# Patient Record
Sex: Male | Born: 1940 | Race: White | Hispanic: No | Marital: Married | Smoking: Former smoker
Health system: Southern US, Community
[De-identification: ages and names within clinical notes are randomized; demographics above are authoritative.]

## PROBLEM LIST (undated history)

## (undated) DIAGNOSIS — F329 Major depressive disorder, single episode, unspecified: Secondary | ICD-10-CM

## (undated) DIAGNOSIS — N1330 Unspecified hydronephrosis: Secondary | ICD-10-CM

## (undated) DIAGNOSIS — N200 Calculus of kidney: Secondary | ICD-10-CM

## (undated) DIAGNOSIS — R3915 Urgency of urination: Secondary | ICD-10-CM

## (undated) DIAGNOSIS — F1120 Opioid dependence, uncomplicated: Secondary | ICD-10-CM

## (undated) DIAGNOSIS — T8859XA Other complications of anesthesia, initial encounter: Secondary | ICD-10-CM

## (undated) DIAGNOSIS — N400 Enlarged prostate without lower urinary tract symptoms: Secondary | ICD-10-CM

## (undated) DIAGNOSIS — T4145XA Adverse effect of unspecified anesthetic, initial encounter: Secondary | ICD-10-CM

## (undated) DIAGNOSIS — Z87442 Personal history of urinary calculi: Secondary | ICD-10-CM

## (undated) DIAGNOSIS — Z8719 Personal history of other diseases of the digestive system: Secondary | ICD-10-CM

## (undated) DIAGNOSIS — H409 Unspecified glaucoma: Secondary | ICD-10-CM

## (undated) DIAGNOSIS — I10 Essential (primary) hypertension: Secondary | ICD-10-CM

## (undated) DIAGNOSIS — Z8711 Personal history of peptic ulcer disease: Secondary | ICD-10-CM

## (undated) DIAGNOSIS — F32A Depression, unspecified: Secondary | ICD-10-CM

## (undated) DIAGNOSIS — M1990 Unspecified osteoarthritis, unspecified site: Secondary | ICD-10-CM

## (undated) HISTORY — PX: CATARACT EXTRACTION W/ INTRAOCULAR LENS  IMPLANT, BILATERAL: SHX1307

## (undated) HISTORY — DX: Major depressive disorder, single episode, unspecified: F32.9

## (undated) HISTORY — DX: Depression, unspecified: F32.A

---

## 1968-06-12 HISTORY — PX: INGUINAL HERNIA REPAIR: SUR1180

## 1975-06-13 HISTORY — PX: OTHER SURGICAL HISTORY: SHX169

## 1984-06-12 HISTORY — PX: VAGOTOMY: SUR1431

## 2008-09-24 ENCOUNTER — Ambulatory Visit (HOSPITAL_COMMUNITY): Admission: RE | Admit: 2008-09-24 | Discharge: 2008-09-25 | Payer: Self-pay | Admitting: Neurosurgery

## 2008-09-24 HISTORY — PX: LUMBAR DISC SURGERY: SHX700

## 2010-01-11 ENCOUNTER — Encounter: Admission: RE | Admit: 2010-01-11 | Discharge: 2010-01-11 | Payer: Self-pay | Admitting: Family Medicine

## 2010-02-22 ENCOUNTER — Inpatient Hospital Stay (HOSPITAL_COMMUNITY)
Admission: RE | Admit: 2010-02-22 | Discharge: 2010-02-25 | Payer: Self-pay | Source: Home / Self Care | Admitting: Orthopedic Surgery

## 2010-02-22 HISTORY — PX: OTHER SURGICAL HISTORY: SHX169

## 2010-03-15 ENCOUNTER — Encounter
Admission: RE | Admit: 2010-03-15 | Discharge: 2010-06-09 | Payer: Self-pay | Source: Home / Self Care | Attending: Orthopedic Surgery | Admitting: Orthopedic Surgery

## 2010-08-25 LAB — CBC
HCT: 37.4 % — ABNORMAL LOW (ref 39.0–52.0)
HCT: 41.7 % (ref 39.0–52.0)
Hemoglobin: 14.5 g/dL (ref 13.0–17.0)
MCHC: 34.2 g/dL (ref 30.0–36.0)
MCHC: 34.8 g/dL (ref 30.0–36.0)
MCV: 89.1 fL (ref 78.0–100.0)
MCV: 89.3 fL (ref 78.0–100.0)
Platelets: 125 10*3/uL — ABNORMAL LOW (ref 150–400)
Platelets: 145 10*3/uL — ABNORMAL LOW (ref 150–400)
RBC: 3.84 MIL/uL — ABNORMAL LOW (ref 4.22–5.81)
RDW: 13.1 % (ref 11.5–15.5)
RDW: 13.2 % (ref 11.5–15.5)
RDW: 13.3 % (ref 11.5–15.5)
WBC: 5.8 10*3/uL (ref 4.0–10.5)
WBC: 6.7 10*3/uL (ref 4.0–10.5)

## 2010-08-25 LAB — BASIC METABOLIC PANEL
Calcium: 9.3 mg/dL (ref 8.4–10.5)
Chloride: 106 mEq/L (ref 96–112)
Creatinine, Ser: 0.89 mg/dL (ref 0.4–1.5)
Creatinine, Ser: 0.89 mg/dL (ref 0.4–1.5)
GFR calc Af Amer: >60 mL/min (ref >60)
Glucose, Bld: 106 mg/dL — ABNORMAL HIGH (ref 70–99)
Glucose, Bld: 125 mg/dL — ABNORMAL HIGH (ref 70–99)
Potassium: 3.8 mEq/L (ref 3.5–5.1)
Sodium: 137 mEq/L (ref 135–145)

## 2010-08-25 LAB — APTT: aPTT: 29 seconds (ref 24–37)

## 2010-08-25 LAB — ABO/RH: ABO/RH(D): O POS

## 2010-08-25 LAB — TYPE AND SCREEN
ABO/RH(D): O POS
Antibody Screen: NEGATIVE

## 2010-08-25 LAB — PROTIME-INR
INR: 1.96 — ABNORMAL HIGH (ref 0.00–1.49)
INR: 1 (ref 0.00–1.49)
Prothrombin Time: 23.1 seconds — ABNORMAL HIGH (ref 11.6–15.2)
Prothrombin Time: 13.4 seconds (ref 11.6–15.2)
Prothrombin Time: 14.1 seconds (ref 11.6–15.2)

## 2010-08-25 LAB — SURGICAL PCR SCREEN: MRSA, PCR: NEGATIVE

## 2010-09-21 LAB — CBC
Hemoglobin: 13.2 g/dL (ref 13.0–17.0)
MCV: 96.3 fL (ref 78.0–100.0)
Platelets: 175 10*3/uL (ref 150–400)

## 2010-09-21 LAB — URINALYSIS, ROUTINE W REFLEX MICROSCOPIC
Nitrite: NEGATIVE
Protein, ur: NEGATIVE mg/dL
Specific Gravity, Urine: 1.022 (ref 1.005–1.030)
pH: 5.5 (ref 5.0–8.0)

## 2010-09-21 LAB — BASIC METABOLIC PANEL
BUN: 21 mg/dL (ref 6–23)
Creatinine, Ser: 0.75 mg/dL (ref 0.4–1.5)
Sodium: 138 mEq/L (ref 135–145)

## 2010-09-21 LAB — APTT: aPTT: 30 seconds (ref 24–37)

## 2010-09-21 LAB — PROTIME-INR
INR: 1 (ref 0.00–1.49)
Prothrombin Time: 13.7 seconds (ref 11.6–15.2)

## 2010-10-25 NOTE — Op Note (Signed)
NAMECICERO, NOY               ACCOUNT NO.:  [id number]   MEDICAL RECORD NO.:  [id number]          PATIENT TYPE:  OIB   LOCATION:  3599                         FACILITY:  [facility]   PHYSICIAN:  Clydene Fake, M.D.  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   PROCEDURE:  Decompressive laminectomy, decompression of the L3 and L4  roots (two levels), diskectomy, microdissection with microscope.   SURGEON:  Clydene Fake, MD   ASSISTANT:  Danae Orleans. Venetia Maxon, MD   ANESTHESIA:  General endotracheal tube anesthesia.   ESTIMATED BLOOD LOSS:  Minimal.   BLOOD GIVEN:  None.   DRAINS:  None.   COMPLICATIONS:  None.   REASON FOR PROCEDURE:  The patient is a 70 year old gentleman with back,  leg pain, trouble walking, claudicatory symptoms, severe pain.  MRI was  done showing multiple spinal change with huge disk herniation at L3-4  causing canal stenosis along with the spinal change.  The patient  brought in for decompression.   PROCEDURE IN DETAIL:  The patient was brought to the operating room and  general anesthesia was induced.  The patient was placed in a prone  position on Wilson frame with all pressure points padded.  The patient  was prepped and draped in sterile fashion.  Site of incision was  injected with 10 mL of 1% lidocaine with epinephrine.  Needles were put  in the interspace through the skin. An x-ray was obtained showing we  were pointed at the L3-4 interbody space.  Incision was then made in the  midline lumbar spine centered where the needle was incision taken down  to the fascia.  Hemostasis obtained with Bovie cauterization.  The  fascia was incised in the right side and  subperiosteal dissection was  done over the L3 and L4 spinous processes and lamina out to the facets.  Marker was placed in the interspace.  Another x-ray was obtained  confirming our positioning at L3-4 interspace.  High-speed drill was  used to start semi-hemi-laminectomy, medial facetectomy.  Microscope was  brought in for microdissection.  This was completed again with high-  speed drill and Kerrison punches removing most of the lamina of L3 and  some lamina of L4.  Did a foraminotomy over the four roots and medial  facetectomy.  We moved to the ligamentum flavum and the dura was tented  up, it looked like definitely under some anterior compression.  We  carefully dissected through the epidural space using microdissection  technique still removing lateral ligament and then found free fragments  of disk.  We started evacuating these amount of small pieces.  We used  various nerve hooks to sweep under the dura and pull the large  fragments.  We were able to get a D'Errico nerve retractor and retract  the dura and we continued removing the fragments  of disk herniation and  now some of the remaining __________ were within the ligament and  annulus was carefully dissected through that and pulled out more  fragments.  Still we had a fairly boggy disk.  More fragments were  coming out and so we had incised the disk space due to diskectomy with  pituitary rongeurs and curettes.  We still reached over with long hooks  towards the contralateral side and pulled out even more fragments.  There were fragments behind the body of L4 and behind the body of L3.  We carefully brought them down into the field and removed them.  We got  hemostasis with bipolar cauterization and Gelfoam and thrombin.  Gelfoam  was irrigated out.  When we were finished, we had good decompression of  the central canal and the L3 and L4 rootsand they went out their  foramina well.  We had good hemostasis.  Retractors removed.   Fascia  closed with 0  Vicryl interrupted sutures.  Subcutaneous tissue closed  with 2-0 and 3-0 Vicryl interrupted suture.  Skin was closed with  benzoin, Steri-Strips, dressing was placed.  The patient was placed back  in supine position, awoken from anesthesia, and transferred to recovery  room in stable condition.            ______________________________  Clydene Fake, M.D.     JRH/MEDQ  D:  09/24/2008  T:  09/25/2008  Job:  [id number]

## 2011-01-17 ENCOUNTER — Other Ambulatory Visit: Payer: Self-pay | Admitting: Family Medicine

## 2011-01-17 ENCOUNTER — Other Ambulatory Visit: Payer: Self-pay

## 2011-01-19 ENCOUNTER — Ambulatory Visit
Admission: RE | Admit: 2011-01-19 | Discharge: 2011-01-19 | Disposition: A | Discharge status: Home / Self Care | Payer: BC Managed Care – HMO | Source: Ambulatory Visit | Attending: Family Medicine | Admitting: Family Medicine

## 2011-03-06 ENCOUNTER — Ambulatory Visit (HOSPITAL_COMMUNITY)
Admission: RE | Admit: 2011-03-06 | Discharge: 2011-03-06 | Disposition: A | Discharge status: Home / Self Care | Payer: BC Managed Care – PPO | Source: Ambulatory Visit | Attending: Urology | Admitting: Urology

## 2011-03-06 DIAGNOSIS — N200 Calculus of kidney: Secondary | ICD-10-CM | POA: Insufficient documentation

## 2011-03-06 DIAGNOSIS — I10 Essential (primary) hypertension: Secondary | ICD-10-CM | POA: Insufficient documentation

## 2011-03-06 HISTORY — PX: EXTRACORPOREAL SHOCK WAVE LITHOTRIPSY: SHX1557

## 2011-11-19 IMAGING — CR DG CHEST 2V
2 series · 2 of 2 positions shown · non-contrast
Comparison: 09/23/2008

CLINICAL DATA: Preadmission evaluation

CHEST - 2 VIEW

[view not recorded (1 of 2)]
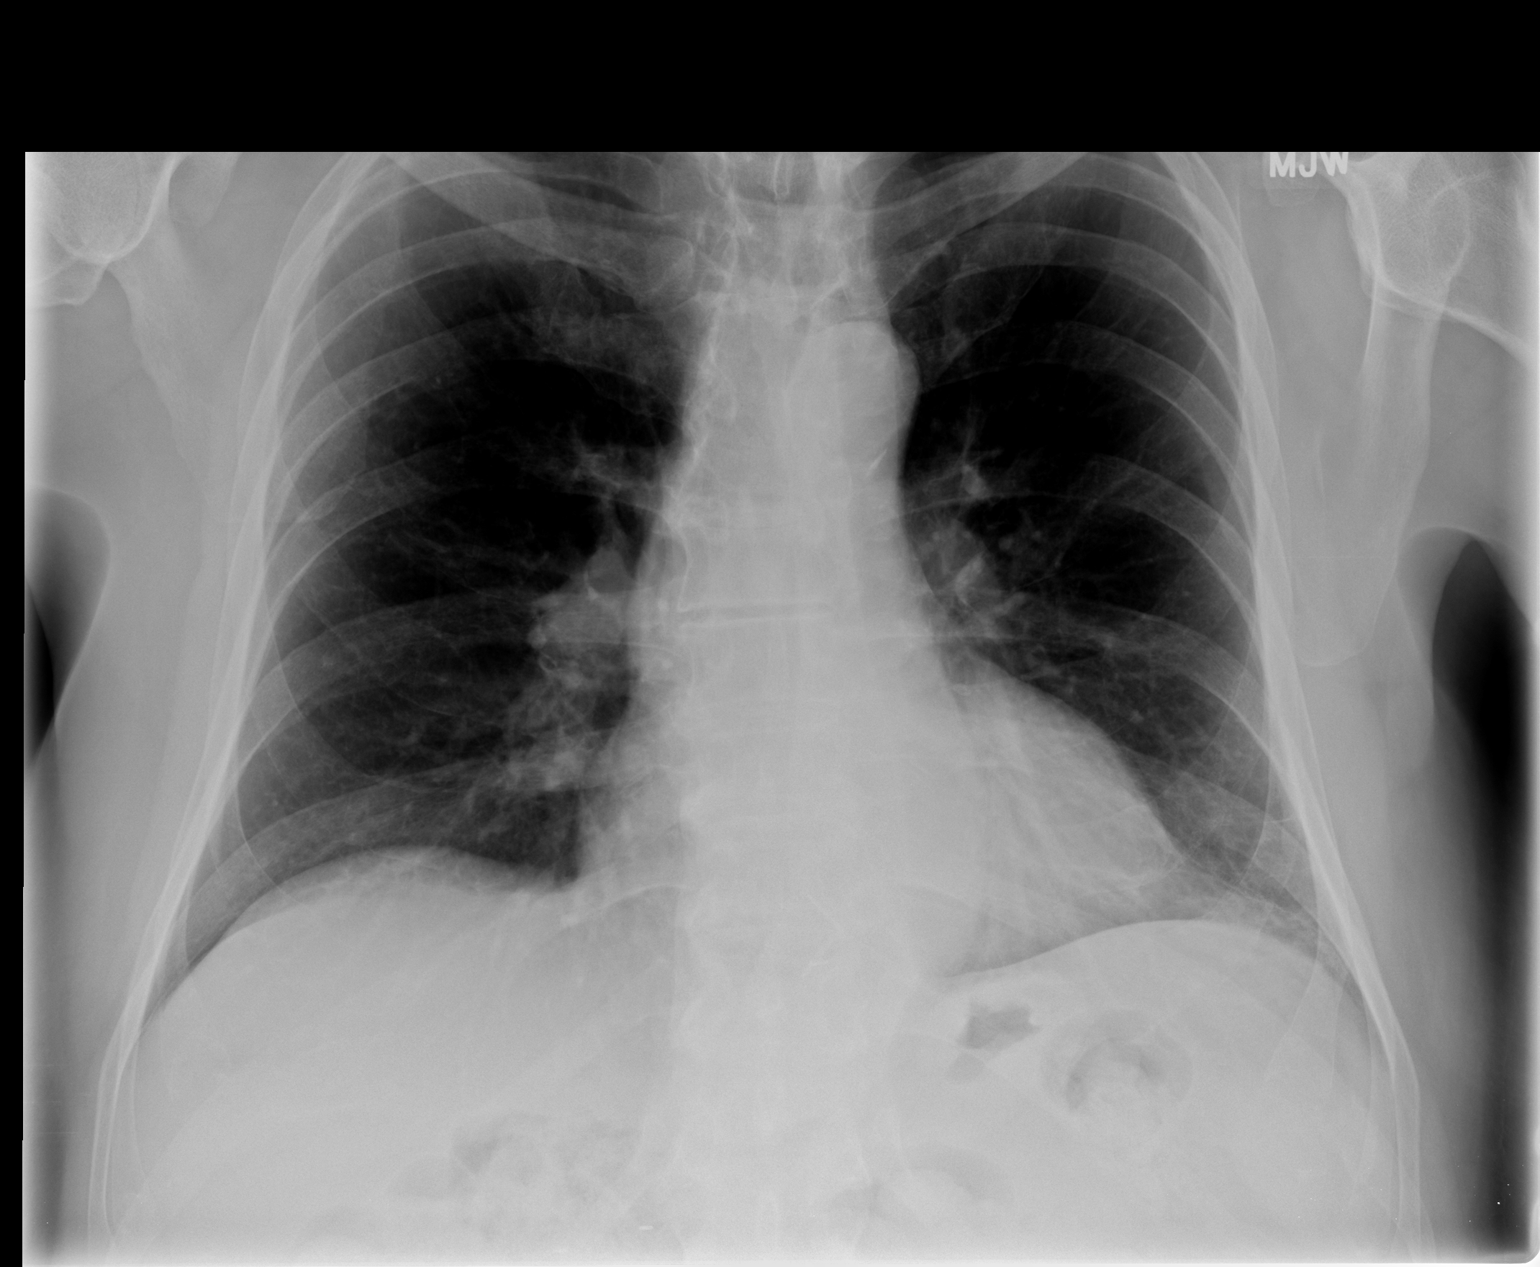

[view not recorded (2 of 2)]
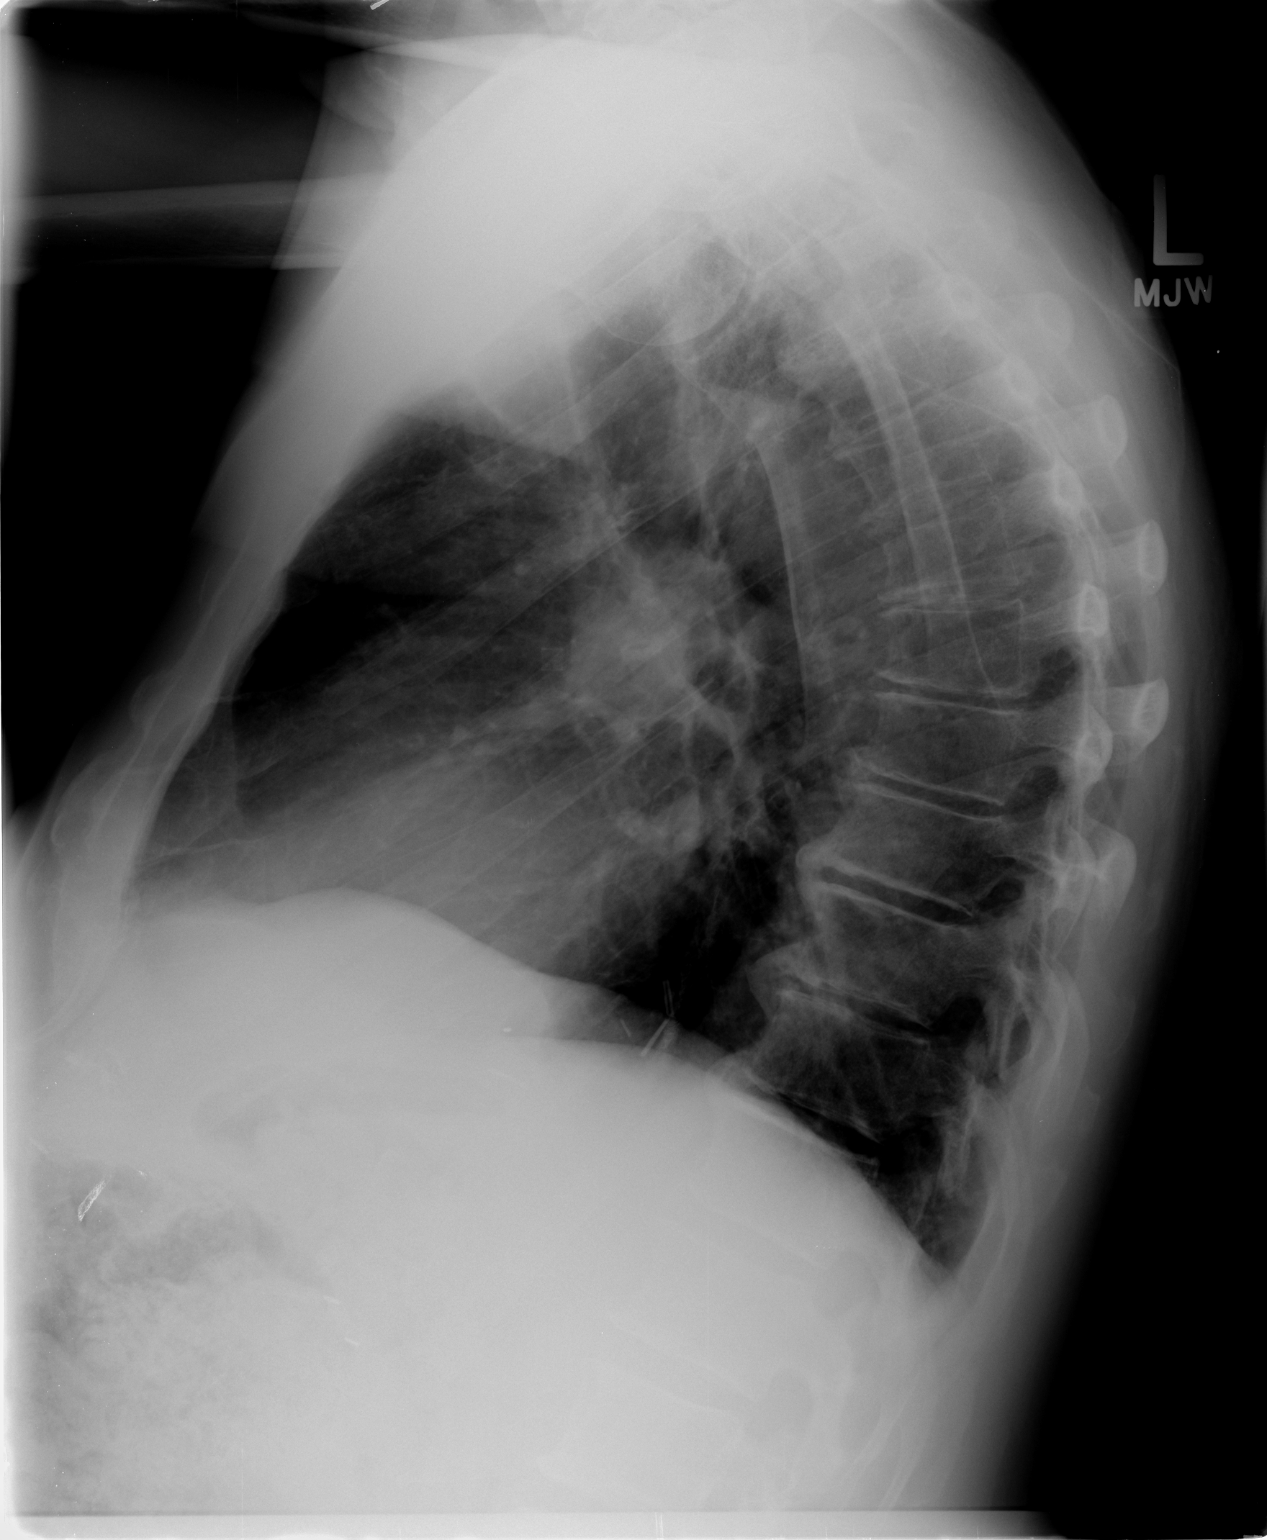

[2 of 2 positions shown; findings below may reference images not displayed]

FINDINGS: The lungs are clear bilaterally with only minimal left
base atelectasis seen.  No confluent airspace opacities, pleural
effuions or pneumothoracies are seen.  The  heart is normal in size
in contour.  The upper abdomen and osseous structures are normal.
Degenerative changes noted in the mid thoracic spine.
IMPRESSION: No acute cardiopulmonary disease.

## 2012-10-15 ENCOUNTER — Emergency Department (HOSPITAL_COMMUNITY)
Admission: EM | Admit: 2012-10-15 | Discharge: 2012-10-15 | Disposition: A | Discharge status: Home / Self Care | Payer: Medicare Other | Attending: Emergency Medicine | Admitting: Emergency Medicine

## 2012-10-15 ENCOUNTER — Emergency Department (HOSPITAL_COMMUNITY): Payer: Medicare Other

## 2012-10-15 ENCOUNTER — Encounter (HOSPITAL_COMMUNITY): Payer: Self-pay

## 2012-10-15 DIAGNOSIS — R109 Unspecified abdominal pain: Secondary | ICD-10-CM | POA: Insufficient documentation

## 2012-10-15 DIAGNOSIS — R142 Eructation: Secondary | ICD-10-CM | POA: Insufficient documentation

## 2012-10-15 DIAGNOSIS — R141 Gas pain: Secondary | ICD-10-CM | POA: Insufficient documentation

## 2012-10-15 DIAGNOSIS — N400 Enlarged prostate without lower urinary tract symptoms: Secondary | ICD-10-CM | POA: Insufficient documentation

## 2012-10-15 DIAGNOSIS — R319 Hematuria, unspecified: Secondary | ICD-10-CM

## 2012-10-15 DIAGNOSIS — Z79899 Other long term (current) drug therapy: Secondary | ICD-10-CM | POA: Insufficient documentation

## 2012-10-15 DIAGNOSIS — Z9849 Cataract extraction status, unspecified eye: Secondary | ICD-10-CM | POA: Insufficient documentation

## 2012-10-15 DIAGNOSIS — I10 Essential (primary) hypertension: Secondary | ICD-10-CM | POA: Insufficient documentation

## 2012-10-15 DIAGNOSIS — Z872 Personal history of diseases of the skin and subcutaneous tissue: Secondary | ICD-10-CM | POA: Insufficient documentation

## 2012-10-15 DIAGNOSIS — F1120 Opioid dependence, uncomplicated: Secondary | ICD-10-CM | POA: Insufficient documentation

## 2012-10-15 DIAGNOSIS — Z87891 Personal history of nicotine dependence: Secondary | ICD-10-CM | POA: Insufficient documentation

## 2012-10-15 DIAGNOSIS — Z7982 Long term (current) use of aspirin: Secondary | ICD-10-CM | POA: Insufficient documentation

## 2012-10-15 HISTORY — DX: Opioid dependence, uncomplicated: F11.20

## 2012-10-15 HISTORY — DX: Essential (primary) hypertension: I10

## 2012-10-15 LAB — CBC WITH DIFFERENTIAL/PLATELET
Basophils Absolute: 0 10*3/uL (ref 0.0–0.1)
Eosinophils Relative: 1 % (ref 0–5)
HCT: 44.3 % (ref 39.0–52.0)
Hemoglobin: 15.1 g/dL (ref 13.0–17.0)
Lymphocytes Relative: 12 % (ref 12–46)
Lymphs Abs: 1.2 10*3/uL (ref 0.7–4.0)
MCV: 86.2 fL (ref 78.0–100.0)
Monocytes Absolute: 0.7 10*3/uL (ref 0.1–1.0)
Monocytes Relative: 6 % (ref 3–12)
Neutro Abs: 8.4 10*3/uL — ABNORMAL HIGH (ref 1.7–7.7)
RBC: 5.14 MIL/uL (ref 4.22–5.81)
RDW: 14.8 % (ref 11.5–15.5)
WBC: 10.4 10*3/uL (ref 4.0–10.5)

## 2012-10-15 LAB — COMPREHENSIVE METABOLIC PANEL
AST: 35 U/L (ref 0–37)
CO2: 23 mEq/L (ref 19–32)
Chloride: 100 mEq/L (ref 96–112)
Creatinine, Ser: 0.88 mg/dL (ref 0.50–1.35)
GFR calc Af Amer: >90 mL/min (ref >90)
GFR calc non Af Amer: 83 mL/min — ABNORMAL LOW (ref >90)
Glucose, Bld: 129 mg/dL — ABNORMAL HIGH (ref 70–99)
Total Bilirubin: 0.8 mg/dL (ref 0.3–1.2)

## 2012-10-15 LAB — URINALYSIS, ROUTINE W REFLEX MICROSCOPIC
Bilirubin Urine: NEGATIVE
Nitrite: NEGATIVE
Protein, ur: 100 mg/dL — AB
Urobilinogen, UA: 0.2 mg/dL (ref 0.0–1.0)

## 2012-10-15 LAB — LIPASE, BLOOD: Lipase: 23 U/L (ref 11–59)

## 2012-10-15 MED ORDER — IOHEXOL 300 MG/ML  SOLN
100.0000 mL | Freq: Once | INTRAMUSCULAR | Status: AC | PRN
  Administered 2012-10-15: 100 mL via INTRAVENOUS

## 2012-10-15 MED ORDER — ACETAMINOPHEN 325 MG PO TABS
650.0000 mg | ORAL_TABLET | Freq: Once | ORAL | Status: AC
  Administered 2012-10-15: 650 mg via ORAL
  Filled 2012-10-15: qty 2

## 2012-10-15 MED ORDER — CEPHALEXIN 500 MG PO CAPS: 500.0000 mg | ORAL_CAPSULE | Freq: Four times a day (QID) | ORAL | Status: DC

## 2012-10-15 MED ORDER — SODIUM CHLORIDE 0.9 % IV SOLN
Freq: Once | INTRAVENOUS | Status: AC
  Administered 2012-10-15: 01:00:00 via INTRAVENOUS

## 2012-10-15 MED ORDER — IOHEXOL 300 MG/ML  SOLN
50.0000 mL | Freq: Once | INTRAMUSCULAR | Status: AC | PRN
  Administered 2012-10-15: 50 mL via ORAL

## 2012-10-15 MED ORDER — ONDANSETRON HCL 4 MG/2ML IJ SOLN
4.0000 mg | Freq: Once | INTRAMUSCULAR | Status: AC
  Administered 2012-10-15: 4 mg via INTRAVENOUS
  Filled 2012-10-15: qty 2

## 2012-10-15 NOTE — ED Provider Notes (Signed)
History     CSN: [id number]  Arrival date & time 10/15/12  0011   First MD Initiated Contact with Patient 10/15/12 0138      Chief Complaint  Patient presents with  . Abdominal Pain    (Consider location/radiation/quality/duration/timing/severity/associated sxs/prior treatment) Patient is a 72 y.o. male presenting with abdominal pain. The history is provided by the patient and the spouse.  Abdominal Pain  patient here complaining of abdominal pain characterized as sharp and located in the left side was started this morning. No fever or vomiting. Denies any dysuria or hematuria. Pain has been constant and has been associated with increased flatus. No treatment used for this prior to arrival. Nothing makes her symptoms better or worse. No prior history of same. Patient denies any skin rashes. Pain does not radiate.  Past Medical History  Diagnosis Date  . Hypertension   . Enlarged prostate   . Opiate addiction   . Ulcer     Past Surgical History  Procedure Laterality Date  . Hernia repair      bilateral  . Back surgery    . Abdominal surgery    . Lithotripsy    . Medial partial knee replacement    . Cataract extraction      No family history on file.  History  Substance Use Topics  . Smoking status: Former Games developer  . Smokeless tobacco: Not on file  . Alcohol Use: 0.6 oz/week    1 Cans of beer per week     Comment: 1 beer every night      Review of Systems  Gastrointestinal: Positive for abdominal pain.  All other systems reviewed and are negative.    Allergies  Review of patient's allergies indicates no known allergies.  Home Medications   Current Outpatient Rx  Name  Route  Sig  Dispense  Refill  . aluminum hydroxide-magnesium carbonate (GAVISCON) 95-358 MG/15ML SUSP   Oral   Take 30 mLs by mouth as needed. Abdominal pain           BP 181/89  Pulse 58  Temp(Src) 97.6 F (36.4 C) (Oral)  Resp 18  Ht 5\' 9"  (1.753 m)  Wt 205 lb (92.987 kg)  BMI  30.26 kg/m2  SpO2 100%  Physical Exam  Nursing note and vitals reviewed. Constitutional: He is oriented to person, place, and time. He appears well-developed and well-nourished.  Non-toxic appearance. No distress.  HENT:  Head: Normocephalic and atraumatic.  Eyes: Conjunctivae, EOM and lids are normal. Pupils are equal, round, and reactive to light.  Neck: Normal range of motion. Neck supple. No tracheal deviation present. No mass present.  Cardiovascular: Normal rate, regular rhythm and normal heart sounds.  Exam reveals no gallop.   No murmur heard. Pulmonary/Chest: Effort normal and breath sounds normal. No stridor. No respiratory distress. He has no decreased breath sounds. He has no wheezes. He has no rhonchi. He has no rales.  Abdominal: Soft. Normal appearance and bowel sounds are normal. He exhibits no distension. There is tenderness in the left lower quadrant. There is guarding. There is no rigidity, no rebound and no CVA tenderness.    Musculoskeletal: Normal range of motion. He exhibits no edema and no tenderness.  Neurological: He is alert and oriented to person, place, and time. He has normal strength. No cranial nerve deficit or sensory deficit. GCS eye subscore is 4. GCS verbal subscore is 5. GCS motor subscore is 6.  Skin: Skin is warm and dry. No abrasion  and no rash noted.  Psychiatric: He has a normal mood and affect. His speech is normal and behavior is normal.    ED Course  Procedures (including critical care time)  Labs Reviewed  CBC WITH DIFFERENTIAL - Abnormal; Notable for the following:    Platelets 148 (*)    Neutrophils Relative 80 (*)    Neutro Abs 8.4 (*)    All other components within normal limits  COMPREHENSIVE METABOLIC PANEL - Abnormal; Notable for the following:    Sodium 134 (*)    Glucose, Bld 129 (*)    GFR calc non Af Amer 83 (*)    All other components within normal limits  URINALYSIS, ROUTINE W REFLEX MICROSCOPIC  LIPASE, BLOOD   No  results found.   No diagnosis found.    MDM  Pt informed about his ct results--will f/u his urologist        Toy Baker, MD 10/15/12 [id number]

## 2012-10-15 NOTE — ED Notes (Signed)
Pt states he has been having cramping pain in his left side since 2000 tonight.  States feeling nauseated.  Pt states he has not eaten since breakfast.  Pt is pale. Pt states he took Gaviscon at 2100 with no relief of pain.

## 2012-10-23 ENCOUNTER — Other Ambulatory Visit: Payer: Self-pay | Admitting: Urology

## 2012-10-30 ENCOUNTER — Encounter (HOSPITAL_BASED_OUTPATIENT_CLINIC_OR_DEPARTMENT_OTHER): Payer: Self-pay | Admitting: *Deleted

## 2012-10-30 NOTE — Progress Notes (Signed)
NPO AFTER MN. ARRIVES AT 0700. NEEDS ISTAT 8 AND EKG. WILL TAKE NORVASC AM OF SURG W/ SIP OF WATER.

## 2012-11-05 NOTE — Anesthesia Preprocedure Evaluation (Addendum)
Anesthesia Evaluation  Patient identified by MRN, date of birth, ID band Patient awake    Reviewed: Allergy & Precautions, H&P , NPO status , Patient's Chart, lab work & pertinent test results  Airway Mallampati: II TM Distance: >3 FB Neck ROM: full    Dental  (+) Edentulous Upper and Edentulous Lower   Pulmonary COPDformer smoker,  80 py smoking history breath sounds clear to auscultation  Pulmonary exam normal       Cardiovascular Exercise Tolerance: Good hypertension, Pt. on medications Rhythm:regular Rate:Normal     Neuro/Psych negative neurological ROS  negative psych ROS   GI/Hepatic negative GI ROS, Neg liver ROS, (+)     substance abuse   , Opiate addiction. On suboxone   Endo/Other  negative endocrine ROS  Renal/GU negative Renal ROS  negative genitourinary   Musculoskeletal   Abdominal   Peds  Hematology negative hematology ROS (+)   Anesthesia Other Findings   Reproductive/Obstetrics negative OB ROS                         Anesthesia Physical Anesthesia Plan  ASA: III  Anesthesia Plan: General   Post-op Pain Management:    Induction: Intravenous  Airway Management Planned: LMA  Additional Equipment:   Intra-op Plan:   Post-operative Plan:   Informed Consent: I have reviewed the patients History and Physical, chart, labs and discussed the procedure including the risks, benefits and alternatives for the proposed anesthesia with the patient or authorized representative who has indicated his/her understanding and acceptance.   Dental Advisory Given  Plan Discussed with: CRNA and Surgeon  Anesthesia Plan Comments:         Anesthesia Quick Evaluation

## 2012-11-06 ENCOUNTER — Encounter (HOSPITAL_BASED_OUTPATIENT_CLINIC_OR_DEPARTMENT_OTHER): Payer: Self-pay

## 2012-11-06 ENCOUNTER — Encounter (HOSPITAL_BASED_OUTPATIENT_CLINIC_OR_DEPARTMENT_OTHER)
Admission: RE | Disposition: A | Discharge status: Home / Self Care | Payer: Self-pay | Source: Ambulatory Visit | Attending: Urology

## 2012-11-06 ENCOUNTER — Ambulatory Visit (HOSPITAL_BASED_OUTPATIENT_CLINIC_OR_DEPARTMENT_OTHER): Payer: Medicare Other | Admitting: Anesthesiology

## 2012-11-06 ENCOUNTER — Ambulatory Visit (HOSPITAL_BASED_OUTPATIENT_CLINIC_OR_DEPARTMENT_OTHER)
Admission: RE | Admit: 2012-11-06 | Discharge: 2012-11-06 | Disposition: A | Discharge status: Home / Self Care | Payer: Medicare Other | Source: Ambulatory Visit | Attending: Urology | Admitting: Urology

## 2012-11-06 ENCOUNTER — Encounter (HOSPITAL_BASED_OUTPATIENT_CLINIC_OR_DEPARTMENT_OTHER): Payer: Self-pay | Admitting: Anesthesiology

## 2012-11-06 DIAGNOSIS — E669 Obesity, unspecified: Secondary | ICD-10-CM | POA: Insufficient documentation

## 2012-11-06 DIAGNOSIS — J4489 Other specified chronic obstructive pulmonary disease: Secondary | ICD-10-CM | POA: Insufficient documentation

## 2012-11-06 DIAGNOSIS — M1710 Unilateral primary osteoarthritis, unspecified knee: Secondary | ICD-10-CM | POA: Insufficient documentation

## 2012-11-06 DIAGNOSIS — H409 Unspecified glaucoma: Secondary | ICD-10-CM | POA: Insufficient documentation

## 2012-11-06 DIAGNOSIS — Z6830 Body mass index (BMI) 30.0-30.9, adult: Secondary | ICD-10-CM | POA: Insufficient documentation

## 2012-11-06 DIAGNOSIS — J449 Chronic obstructive pulmonary disease, unspecified: Secondary | ICD-10-CM | POA: Insufficient documentation

## 2012-11-06 DIAGNOSIS — N138 Other obstructive and reflux uropathy: Secondary | ICD-10-CM | POA: Insufficient documentation

## 2012-11-06 DIAGNOSIS — Z79899 Other long term (current) drug therapy: Secondary | ICD-10-CM | POA: Insufficient documentation

## 2012-11-06 DIAGNOSIS — K219 Gastro-esophageal reflux disease without esophagitis: Secondary | ICD-10-CM | POA: Insufficient documentation

## 2012-11-06 DIAGNOSIS — N200 Calculus of kidney: Secondary | ICD-10-CM | POA: Insufficient documentation

## 2012-11-06 DIAGNOSIS — Z87891 Personal history of nicotine dependence: Secondary | ICD-10-CM | POA: Insufficient documentation

## 2012-11-06 DIAGNOSIS — N1330 Unspecified hydronephrosis: Secondary | ICD-10-CM | POA: Insufficient documentation

## 2012-11-06 DIAGNOSIS — R3915 Urgency of urination: Secondary | ICD-10-CM | POA: Insufficient documentation

## 2012-11-06 DIAGNOSIS — R350 Frequency of micturition: Secondary | ICD-10-CM | POA: Insufficient documentation

## 2012-11-06 DIAGNOSIS — N401 Enlarged prostate with lower urinary tract symptoms: Secondary | ICD-10-CM | POA: Insufficient documentation

## 2012-11-06 DIAGNOSIS — I10 Essential (primary) hypertension: Secondary | ICD-10-CM | POA: Insufficient documentation

## 2012-11-06 DIAGNOSIS — F1120 Opioid dependence, uncomplicated: Secondary | ICD-10-CM | POA: Insufficient documentation

## 2012-11-06 HISTORY — DX: Personal history of urinary calculi: Z87.442

## 2012-11-06 HISTORY — DX: Unspecified glaucoma: H40.9

## 2012-11-06 HISTORY — PX: CYSTOSCOPY WITH BIOPSY: SHX5122

## 2012-11-06 HISTORY — DX: Urgency of urination: R39.15

## 2012-11-06 HISTORY — DX: Adverse effect of unspecified anesthetic, initial encounter: T41.45XA

## 2012-11-06 HISTORY — DX: Unspecified osteoarthritis, unspecified site: M19.90

## 2012-11-06 HISTORY — DX: Personal history of other diseases of the digestive system: Z87.19

## 2012-11-06 HISTORY — DX: Personal history of peptic ulcer disease: Z87.11

## 2012-11-06 HISTORY — DX: Calculus of kidney: N20.0

## 2012-11-06 HISTORY — PX: HOLMIUM LASER APPLICATION: SHX5852

## 2012-11-06 HISTORY — DX: Benign prostatic hyperplasia without lower urinary tract symptoms: N40.0

## 2012-11-06 HISTORY — DX: Unspecified hydronephrosis: N13.30

## 2012-11-06 HISTORY — DX: Other complications of anesthesia, initial encounter: T88.59XA

## 2012-11-06 LAB — POCT I-STAT, CHEM 8
BUN: 20 mg/dL (ref 6–23)
Chloride: 105 mEq/L (ref 96–112)
Potassium: 4.1 mEq/L (ref 3.5–5.1)
Sodium: 141 mEq/L (ref 135–145)
TCO2: 28 mmol/L (ref 0–100)

## 2012-11-06 SURGERY — CYSTOURETEROSCOPY, WITH STENT INSERTION
Anesthesia: General | Site: Ureter | Laterality: Left | Wound class: Clean Contaminated

## 2012-11-06 MED ORDER — LACTATED RINGERS IV SOLN
INTRAVENOUS | Status: DC
  Administered 2012-11-06 (×2): via INTRAVENOUS
  Filled 2012-11-06: qty 1000

## 2012-11-06 MED ORDER — FENTANYL CITRATE 0.05 MG/ML IJ SOLN
INTRAMUSCULAR | Status: DC | PRN
  Administered 2012-11-06: 25 ug via INTRAVENOUS
  Administered 2012-11-06: 50 ug via INTRAVENOUS
  Administered 2012-11-06: 25 ug via INTRAVENOUS

## 2012-11-06 MED ORDER — OXYCODONE-ACETAMINOPHEN 5-325 MG PO TABS: 1.0000 | ORAL_TABLET | ORAL | Status: DC | PRN

## 2012-11-06 MED ORDER — OXYBUTYNIN CHLORIDE 5 MG PO TABS: 5.0000 mg | ORAL_TABLET | Freq: Three times a day (TID) | ORAL | Status: DC | PRN

## 2012-11-06 MED ORDER — GENTAMICIN IN SALINE 1.6-0.9 MG/ML-% IV SOLN
80.0000 mg | INTRAVENOUS | Status: AC
  Administered 2012-11-06: 470 mg via INTRAVENOUS
  Filled 2012-11-06: qty 50

## 2012-11-06 MED ORDER — EPHEDRINE SULFATE 50 MG/ML IJ SOLN
INTRAMUSCULAR | Status: DC | PRN
  Administered 2012-11-06 (×2): 10 mg via INTRAVENOUS
  Administered 2012-11-06: 5 mg via INTRAVENOUS
  Administered 2012-11-06: 10 mg via INTRAVENOUS

## 2012-11-06 MED ORDER — SENNA-DOCUSATE SODIUM 8.6-50 MG PO TABS: 1.0000 | ORAL_TABLET | Freq: Two times a day (BID) | ORAL | Status: DC

## 2012-11-06 MED ORDER — ACETAMINOPHEN 10 MG/ML IV SOLN
INTRAVENOUS | Status: DC | PRN
  Administered 2012-11-06: 1000 mg via INTRAVENOUS

## 2012-11-06 MED ORDER — ONDANSETRON HCL 4 MG/2ML IJ SOLN
INTRAMUSCULAR | Status: DC | PRN
  Administered 2012-11-06: 4 mg via INTRAVENOUS

## 2012-11-06 MED ORDER — PROPOFOL 10 MG/ML IV BOLUS
INTRAVENOUS | Status: DC | PRN
  Administered 2012-11-06: 200 mg via INTRAVENOUS

## 2012-11-06 MED ORDER — SODIUM CHLORIDE 0.9 % IR SOLN
Status: DC | PRN
  Administered 2012-11-06: 6000 mL

## 2012-11-06 MED ORDER — FENTANYL CITRATE 0.05 MG/ML IJ SOLN
25.0000 ug | INTRAMUSCULAR | Status: DC | PRN
  Filled 2012-11-06: qty 1

## 2012-11-06 MED ORDER — METOCLOPRAMIDE HCL 5 MG/ML IJ SOLN
INTRAMUSCULAR | Status: DC | PRN
  Administered 2012-11-06: 10 mg via INTRAVENOUS

## 2012-11-06 MED ORDER — SULFAMETHOXAZOLE-TRIMETHOPRIM 400-80 MG PO TABS: 1.0000 | ORAL_TABLET | Freq: Two times a day (BID) | ORAL | Status: DC

## 2012-11-06 MED ORDER — IOHEXOL 350 MG/ML SOLN
INTRAVENOUS | Status: DC | PRN
  Administered 2012-11-06: 24 mL

## 2012-11-06 MED ORDER — LIDOCAINE HCL (CARDIAC) 20 MG/ML IV SOLN
INTRAVENOUS | Status: DC | PRN
  Administered 2012-11-06: 80 mg via INTRAVENOUS

## 2012-11-06 MED ORDER — KETOROLAC TROMETHAMINE 30 MG/ML IJ SOLN
INTRAMUSCULAR | Status: DC | PRN
  Administered 2012-11-06: 15 mg via INTRAVENOUS

## 2012-11-06 MED ORDER — DEXAMETHASONE SODIUM PHOSPHATE 4 MG/ML IJ SOLN
INTRAMUSCULAR | Status: DC | PRN
  Administered 2012-11-06: 10 mg via INTRAVENOUS

## 2012-11-06 MED ORDER — LACTATED RINGERS IV SOLN
INTRAVENOUS | Status: DC
  Filled 2012-11-06: qty 1000

## 2012-11-06 SURGICAL SUPPLY — 46 items
ADAPTER CATH URET PLST 4-6FR (CATHETERS) IMPLANT
BAG DRAIN URO-CYSTO SKYTR STRL (DRAIN) ×3 IMPLANT
BAG URO CATCHER STRL LF (DRAPE) ×3 IMPLANT
BASKET LASER NITINOL 1.9FR (BASKET) ×3 IMPLANT
BASKET STNLS GEMINI 4WIRE 3FR (BASKET) IMPLANT
BASKET ZERO TIP NITINOL 2.4FR (BASKET) IMPLANT
BRUSH URET BIOPSY 3F (UROLOGICAL SUPPLIES) ×3 IMPLANT
CANISTER SUCT LVC 12 LTR MEDI- (MISCELLANEOUS) ×3 IMPLANT
CATH FOLEY 2WAY  3CC  8FR (CATHETERS)
CATH FOLEY 2WAY 3CC 8FR (CATHETERS) IMPLANT
CATH INTERMIT  6FR 70CM (CATHETERS) IMPLANT
CATH URET 5FR 28IN CONE TIP (BALLOONS)
CATH URET 5FR 28IN OPEN ENDED (CATHETERS) IMPLANT
CATH URET 5FR 70CM CONE TIP (BALLOONS) IMPLANT
CLOTH BEACON ORANGE TIMEOUT ST (SAFETY) ×3 IMPLANT
DRAPE CAMERA CLOSED 9X96 (DRAPES) ×3 IMPLANT
DRESSING TELFA 8X3 (GAUZE/BANDAGES/DRESSINGS) ×3 IMPLANT
ELECT REM PT RETURN 9FT ADLT (ELECTROSURGICAL)
ELECTRODE REM PT RTRN 9FT ADLT (ELECTROSURGICAL) IMPLANT
FIBER LASER TRAC TIP (UROLOGICAL SUPPLIES) IMPLANT
FORCEPS BIOP 2.4F 115CM BACKLD (INSTRUMENTS) ×3 IMPLANT
GLOVE BIO SURGEON STRL SZ 6 (GLOVE) ×3 IMPLANT
GLOVE BIO SURGEON STRL SZ 6.5 (GLOVE) ×3 IMPLANT
GLOVE BIO SURGEON STRL SZ7 (GLOVE) ×3 IMPLANT
GLOVE BIO SURGEON STRL SZ7.5 (GLOVE) ×3 IMPLANT
GOWN PREVENTION PLUS LG XLONG (DISPOSABLE) IMPLANT
GOWN PREVENTION PLUS XLARGE (GOWN DISPOSABLE) IMPLANT
GOWN STRL NON-REIN LRG LVL3 (GOWN DISPOSABLE) IMPLANT
GOWN STRL REIN XL XLG (GOWN DISPOSABLE) IMPLANT
GUIDEWIRE 0.038 PTFE COATED (WIRE) IMPLANT
GUIDEWIRE ANG ZIPWIRE 038X150 (WIRE) ×3 IMPLANT
GUIDEWIRE STR DUAL SENSOR (WIRE) ×3 IMPLANT
IV NS IRRIG 3000ML ARTHROMATIC (IV SOLUTION) ×6 IMPLANT
KIT BALLIN UROMAX 15FX10 (LABEL) IMPLANT
KIT BALLN UROMAX 15FX4 (MISCELLANEOUS) IMPLANT
KIT BALLN UROMAX 26 75X4 (MISCELLANEOUS)
LASER FIBER DISP (UROLOGICAL SUPPLIES) ×3 IMPLANT
PACK CYSTOSCOPY (CUSTOM PROCEDURE TRAY) ×3 IMPLANT
SET HIGH PRES BAL DIL (LABEL)
SHEATH ACCESS URETERAL 38CM (SHEATH) ×3 IMPLANT
SHEATH URET ACCESS 12FR/35CM (UROLOGICAL SUPPLIES) IMPLANT
SHEATH URET ACCESS 12FR/55CM (UROLOGICAL SUPPLIES) IMPLANT
STENT CONTOUR 7FRX26 (STENTS) ×3 IMPLANT
SYRINGE 10CC LL (SYRINGE) ×3 IMPLANT
SYRINGE IRR TOOMEY STRL 70CC (SYRINGE) IMPLANT
TUBE FEEDING 8FR 16IN STR KANG (MISCELLANEOUS) ×3 IMPLANT

## 2012-11-06 NOTE — H&P (Signed)
David Carroll is an 72 y.o. male.    Chief Complaint: Pre-Op Left Ureteroscopic Stone Manipulation, Transurethral Resection of Prostate  HPI:   1 - Left Nephrolithiasis / Hydro / Flank Pain - Pt with approx 1.5cm left sided scatterd stones with moderate hydro and UPJ thickeneing by CT in ER 10/2012, no obvious obstructing stones to explain hydro. UA w/o infectious parameters.This was performed on w/u for several hour episode of acute lefat abdominal pain that has since completely resolved.  2 -  Lower Urinary Tract Symptoms - Pt on Doxazosin 8mg  nihgtly for obstructive symptoms. CT 10/2012 with massive trilobar hypertrophy with large intravesicale median lobe, total volume about 100gm. Minimal bother on current regimen.  PMH sig for bilateral inguinal hernia repair, back surgery (no deficits), ex-lap for perforated ulcer. No CV disease / blood thinners / diabetes.   Today Tayron is seen to proceed with left ureteroscopy / laser lithotripsy and possible channel TURP to address above. His most recent UCX was negative.  Past Medical History  Diagnosis Date  . Hypertension   . Opiate addiction   . Hydronephrosis, left   . Nephrolithiasis   . BPH (benign prostatic hypertrophy)   . History of gastric ulcer   . History of kidney stones   . Urgency of urination   . Glaucoma   . History of gastroesophageal reflux (GERD)   . Arthritis     KNEES  . Complication of anesthesia SLOW RECOVERING FROM ANES. DRUGS    HX OPIATE ADDICTION-- PT ON SUBOXONE    Past Surgical History  Procedure Laterality Date  . Lumbar disc surgery  09-24-2008    L3 -- L4  . Medial compartment arthroplasty, left knee  02-22-2010  . Extracorporeal shock wave lithotripsy Left 03-06-2011  . Cataract extraction w/ intraocular lens  implant, bilateral    . Vagotomy  1986    ULCERS  . Removal benign mass left lung  1977  . Inguinal hernia repair Bilateral 1970    RE-DO LEFT 1985    History reviewed. No pertinent  family history. Social History:  reports that he quit smoking about 6 years ago. His smoking use included Cigarettes. He has a 80 pack-year smoking history. He has never used smokeless tobacco. He reports that he drinks about 4.2 ounces of alcohol per week. He reports that he does not use illicit drugs.  Allergies: No Known Allergies  No prescriptions prior to admission    No results found for this or any previous visit (from the past 48 hour(s)). No results found.  Review of Systems  Constitutional: Negative.  Negative for fever and chills.  HENT: Negative.   Eyes: Negative.   Respiratory: Negative.   Cardiovascular: Negative.   Gastrointestinal: Negative.  Negative for nausea and vomiting.  Genitourinary: Positive for frequency.  Musculoskeletal: Negative.   Skin: Negative.   Neurological: Negative.   Endo/Heme/Allergies: Negative.   Psychiatric/Behavioral: Negative.     Height 5\' 9"  (1.753 m), weight 92.987 kg (205 lb). Physical Exam  Constitutional: He is oriented to person, place, and time. He appears well-developed and well-nourished.  HENT:  Head: Normocephalic and atraumatic.  Eyes: EOM are normal. Pupils are equal, round, and reactive to light.  Neck: Normal range of motion.  Respiratory: Effort normal.  GI: Soft. Bowel sounds are normal.  Moderate obesity  Genitourinary: Penis normal.  No CVAT  Musculoskeletal: Normal range of motion.  Neurological: He is alert and oriented to person, place, and time.  Skin: Skin is warm  and dry.  Psychiatric: He has a normal mood and affect. His behavior is normal. Judgment and thought content normal.     Assessment/Plan  1 - Left Nephrolithiasis / Hydro / Flank Pain - Unusual picure with sig hydro and UPJ thickening not directly explained by obstructing stone. Discussed role of endoscopic exam with ureteroscopy with goal of ruling out mass / tumor and treating his intra-renal stones. May need to be staged given large stone  burden. May also need channel TURP at same time as huge medial lobe may not allow access to ureteral orifice.  We re-discussed ureteroscopic stone manipulation with basketing and laser-lithotripsy in detail.  We re-discussed risks including bleeding, infection, damage to kidney / ureter  bladder, rarely loss of kidney. We re-discussed anesthetic risks and rare but serious surgical complications including DVT, PE, MI, and mortality. We specifically addressed that in 5-10% of cases a staged approach is required with stenting followed by re-attempt ureteroscopy if anatomy unfavorable. The patient voiced understanding and wises to proceed.   2 - Lower Urinary Tract Symptoms - Discussed role of channel TURP / resection of median lobe at time of URS as per above to hep with his LUTS as well as allow for ureteroscopy. No h/o retention.  We re-discussed options for medical refractory prostatic outlet obstruction including TURP, TUNA, TUMT, Green-Light, Ho-LEP, and simple prostatectomy with their respective risks, benefits, and long-term outcomes data. Pt has opted for TURP. We re-discussed the typical peri-operative course with overnight admission and discharge with foley in place with subsequent office voiding trial few days later. We re-discussed risks including bleeding, infection, incontinence, need for repeat procedures / tissue regrowth over time as well as rare risks including DVT, PE, MI, CVA and mortality.    Malini Flemings 11/06/2012, 6:16 AM

## 2012-11-06 NOTE — Brief Op Note (Signed)
11/06/2012  10:32 AM  PATIENT:  Geronimo Boot  72 y.o. male  PRE-OPERATIVE DIAGNOSIS:  LEFT HYDRONEPHROSIS,AND NEPHROLITHIASIS  POST-OPERATIVE DIAGNOSIS:  LEFT HYDRONEPHROSIS,AND NEPHROLITHIASIS  PROCEDURE:  Procedure(s): CYSTOSCOPY WITH LEFT RETROGRADE, LEFT  URETEROSCOPY AND  LEFT STENT PLACEMENT (Left) HOLMIUM LASER APPLICATION (Left) CYSTOSCOPY WITH BIOPSY (Left)  SURGEON:  Surgeon(s) and Role:    * Sebastian Ache, MD - Primary  PHYSICIAN ASSISTANT:   ASSISTANTS: none   ANESTHESIA:   general  EBL:  Total I/O In: 1000 [I.V.:1000] Out: -   BLOOD ADMINISTERED:none  DRAINS: none   LOCAL MEDICATIONS USED:  NONE  SPECIMEN:  Source of Specimen:  1 - Left Renal Stones, 2 - Left UPJ Brush Biopsy  DISPOSITION OF SPECIMEN:  1 - Stones for compositional Analysis, 2 - Left UPJ Brush Biopsy to cytology pathology  COUNTS:  YES  TOURNIQUET:  * No tourniquets in log *  DICTATION: .Other Dictation: Dictation Number [id number]  PLAN OF CARE: Discharge to home after PACU  PATIENT DISPOSITION:  PACU - hemodynamically stable.   Delay start of Pharmacological VTE agent (>24hrs) due to surgical blood loss or risk of bleeding: not applicable

## 2012-11-06 NOTE — Anesthesia Postprocedure Evaluation (Signed)
  Anesthesia Post-op Note  Patient: David Carroll  Procedure(s) Performed: Procedure(s) (LRB): CYSTOSCOPY WITH LEFT RETROGRADE, LEFT  URETEROSCOPY AND  LEFT STENT PLACEMENT (Left) HOLMIUM LASER APPLICATION (Left) CYSTOSCOPY WITH BIOPSY (Left)  Patient Location: PACU  Anesthesia Type: General  Level of Consciousness: awake and alert   Airway and Oxygen Therapy: Patient Spontanous Breathing  Post-op Pain: mild  Post-op Assessment: Post-op Vital signs reviewed, Patient's Cardiovascular Status Stable, Respiratory Function Stable, Patent Airway and No signs of Nausea or vomiting  Last Vitals:  Filed Vitals:   11/06/12 1130  BP: 137/71  Pulse: 75  Temp:   Resp: 10    Post-op Vital Signs: stable   Complications: No apparent anesthesia complications

## 2012-11-06 NOTE — Anesthesia Procedure Notes (Signed)
Procedure Name: LMA Insertion Date/Time: 11/06/2012 8:30 AM Performed by: Norva Pavlov Pre-anesthesia Checklist: Patient identified, Emergency Drugs available, Suction available and Patient being monitored Patient Re-evaluated:Patient Re-evaluated prior to inductionOxygen Delivery Method: Circle System Utilized Preoxygenation: Pre-oxygenation with 100% oxygen Intubation Type: IV induction Ventilation: Mask ventilation without difficulty LMA: LMA inserted LMA Size: 5.0 Number of attempts: 1 Airway Equipment and Method: bite block Placement Confirmation: positive ETCO2 Tube secured with: Tape Dental Injury: Teeth and Oropharynx as per pre-operative assessment

## 2012-11-06 NOTE — Transfer of Care (Signed)
Immediate Anesthesia Transfer of Care Note  Patient: David Carroll  Procedure(s) Performed: Procedure(s) (LRB): CYSTOSCOPY WITH LEFT RETROGRADE, LEFT  URETEROSCOPY AND  LEFT STENT PLACEMENT (Left) HOLMIUM LASER APPLICATION (Left) CYSTOSCOPY WITH BIOPSY (Left)  Patient Location: PACU  Anesthesia Type: General  Level of Consciousness: drowsy  Airway & Oxygen Therapy: Patient Spontanous Breathing and Patient connected to face mask oxygen  Post-op Assessment: Report given to PACU RN and Post -op Vital signs reviewed and stable  Post vital signs: Reviewed and stable  Complications: No apparent anesthesia complications

## 2012-11-07 ENCOUNTER — Encounter (HOSPITAL_BASED_OUTPATIENT_CLINIC_OR_DEPARTMENT_OTHER): Payer: Self-pay | Admitting: Urology

## 2012-11-07 NOTE — Op Note (Signed)
David Carroll, David Carroll               ACCOUNT NO.:  [id number]  MEDICAL RECORD NO.:  [id number]  LOCATION:                                FACILITY:  [facility]  PHYSICIAN:  Sebastian Ache, MD     DATE OF BIRTH:  1940-12-24  DATE OF PROCEDURE:  11/06/2012 DATE OF DISCHARGE:                              OPERATIVE REPORT   DIAGNOSES:  Large volume left renal stone, left hydronephrosis.  PROCEDURES: 1. Cystoscopy with left retrograde pyelogram, interpretation. 2. Left ureteroscopic stone manipulation with laser lithotripsy. 3. Brush biopsy of left ureter. 4. Placement of left ureteral stent, 7 x 26, no tether.  SPECIMEN: 1. Left UPJ brush biopsy for permanent pathology. 2. Left renal stone for compositional analysis.  FINDINGS: 1. 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  No     obvious filling defects or intraluminal lesions.  The UPJ was first     biopsied. 2. Large volume small left intrarenal stones, 63 individual stones     were removed. 3. Otherwise unremarkable left retrograde pyelogram. 4. Trilobar prostatic hypertrophy.  ESTIMATED BLOOD LOSS:  Nil.  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  He also has baseline obstructive prostatic symptoms and known trilobar prostatic hypertrophy.  Options were discussed for management including endoscopic exam to help rule out malignancy and treated stones and he wished to proceed with the latter. Informed consent was obtained and placed in the medical record.  PROCEDURE IN DETAIL:  The patient being David Carroll, was verified. Procedure being left ureteroscopic stone manipulation and ureteral biopsy was confirmed.  Procedure was carried out.  Time-out was performed.  Intravenous antibiotics were administered.   General LMA anesthesia was introduced.  The patient was placed into a low lithotomy position.  Sterile field was created by prepping and draping the patient's penis, perineum, and proximal thighs using iodine x3.  Next, cystourethroscopy was performed using a 22-French rigid cystoscope with 12-degree offset lens.  Inspection of the anterior and posterior urethra revealed large trilobar prostatic hypertrophy with a very prominent median lobe.  Despite this, it did allow complete visualization of the urinary bladder and area of the ureteral orifices.  No papular lesions, calcifications were noted.  Next, the left ureteral orifice was gently cannulated using a 6-French end-hole catheter and left retrograde pyelogram was seen.  Left retrograde pyelogram demonstrated a single left ureter, single- system left kidney.  There was hydronephrosis with ureteral nephrosis consistent with narrowing at the area of the UPJ, this appeared to be mild.  There were multiple intrarenal filling defect consistent with known stone.  A 0.038 Glidewire was advanced at the level of the upper pole and set aside as a safety wire.  Next, semi-rigid ureteroscopy was performed at the distal 2/3rd of the ureter and alongside a separate Sensor working wire using normal saline irrigation under pressure.  No mucosal abnormalities or calcifications were encountered.  Next, the semi-rigid ureteroscope was exchanged for the 38-cm 12/14 ureteral access  sheath, which was placed over the Sensory working wire using continuous fluoroscopic vision to the level of the UPJ.  Next, the 8- French flexible digital ureteroscope was used to perform inspection of the proximal ureter and intrarenal kidney.  This did reveal relative narrowing at the area of the UPJ without obvious mass consistent with a mild UPJ obstruction.  There were numerous small stones within two separate lower pole calices.  The vast majority of which appear to  be amenable to simple basketing as such and escape-type basket was used to remove greater than 40 stones individually setting aside for compositional analysis.  The remainder of the stone appeared to be too large for simple basketing as such holmium laser energy was applied using settings of 0.5 joules and 10 hertz, and a 290-mm fiber and these were broken into fragments, 3 mm or less in diameter, was then allowed basketing of the remainder of the stones, 63 individual stones were removed.  Repeat panendoscopy revealed excellent hemostasis.  No evidence of renal perforation and complete removal of all stones greater than 1/3-mm.  Attention was then directed to the area of the left UPJ, again as there was relative narrowing here and some thickening noted on previous CAT scan.  It was felt that the biopsy was warranted.  Initial attempt was made to biopsy this using the BIGopsy.  However, as we were attempting to biopsying the area to the side of the ureter, this would not allow for adequate specimen.  Decision was made to proceed with a brush biopsy and the brush biopsy apparatus was loaded through the ureteroscope and under direct vision, brush biopsy was taken to the layer of the UPJ.  There appeared to be sufficient cellularity and set aside for permanent pathology.  Repeat exam revealed no evidence of perforation and complete removal of all stones and no mucosal abnormalities on reinspection of entire length of the ureter.  Finally, a new 7 x 26 stent was placed over the remaining Glidewire using cystoscopic and fluoroscopic guidance.  Good proximal and distal curl were noted.  Bladder was emptied per cystoscope.  Procedure was then terminated.  The patient tolerated the procedure well.  There were no immediate periprocedural complications.  The patient was taken to postanesthesia care unit in stable condition.          ______________________________ Sebastian Ache,  MD     TM/MEDQ  D:  11/06/2012  T:  11/07/2012  Job:  [id number]

## 2013-09-18 ENCOUNTER — Encounter (HOSPITAL_COMMUNITY): Payer: Self-pay | Admitting: Psychiatry

## 2013-09-18 ENCOUNTER — Ambulatory Visit (INDEPENDENT_AMBULATORY_CARE_PROVIDER_SITE_OTHER): Payer: 59 | Admitting: Psychiatry

## 2013-09-18 ENCOUNTER — Encounter (INDEPENDENT_AMBULATORY_CARE_PROVIDER_SITE_OTHER): Payer: Self-pay

## 2013-09-18 VITALS — BP 126/82 | HR 76 | Ht 68.5 in | Wt 213.2 lb

## 2013-09-18 DIAGNOSIS — F1120 Opioid dependence, uncomplicated: Secondary | ICD-10-CM | POA: Insufficient documentation

## 2013-09-18 DIAGNOSIS — F329 Major depressive disorder, single episode, unspecified: Secondary | ICD-10-CM | POA: Insufficient documentation

## 2013-09-18 MED ORDER — BUPROPION HCL ER (XL) 150 MG PO TB24: 300.0000 mg | ORAL_TABLET | Freq: Every morning | ORAL | Status: DC
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
## 2013-09-18 NOTE — Patient Instructions (Signed)
Fax over the labs [phone]  Medications: Increase Wellbutrin XR 300mg  po qAM, continue Sertraline 100mg  po qAM. Taper off the Xanax 0.5mg  po qPM prn for 5 days then stop.

## 2013-10-14 ENCOUNTER — Ambulatory Visit (INDEPENDENT_AMBULATORY_CARE_PROVIDER_SITE_OTHER): Payer: 59 | Admitting: Psychiatry

## 2013-10-14 DIAGNOSIS — F1920 Other psychoactive substance dependence, uncomplicated: Secondary | ICD-10-CM

## 2013-10-14 DIAGNOSIS — F1010 Alcohol abuse, uncomplicated: Secondary | ICD-10-CM

## 2013-10-23 ENCOUNTER — Ambulatory Visit (INDEPENDENT_AMBULATORY_CARE_PROVIDER_SITE_OTHER): Payer: 59 | Admitting: Psychiatry

## 2013-10-23 DIAGNOSIS — F1920 Other psychoactive substance dependence, uncomplicated: Secondary | ICD-10-CM

## 2013-10-23 DIAGNOSIS — F1010 Alcohol abuse, uncomplicated: Secondary | ICD-10-CM

## 2013-11-11 ENCOUNTER — Ambulatory Visit (INDEPENDENT_AMBULATORY_CARE_PROVIDER_SITE_OTHER): Payer: 59 | Admitting: Psychiatry

## 2013-11-11 DIAGNOSIS — F1920 Other psychoactive substance dependence, uncomplicated: Secondary | ICD-10-CM

## 2013-11-11 DIAGNOSIS — F1010 Alcohol abuse, uncomplicated: Secondary | ICD-10-CM

## 2013-11-18 ENCOUNTER — Encounter (HOSPITAL_COMMUNITY): Payer: Self-pay | Admitting: Psychiatry

## 2013-11-18 ENCOUNTER — Ambulatory Visit (INDEPENDENT_AMBULATORY_CARE_PROVIDER_SITE_OTHER): Payer: 59 | Admitting: Psychiatry

## 2013-11-18 VITALS — BP 140/86 | HR 78 | Ht 67.5 in | Wt 209.0 lb

## 2013-11-18 DIAGNOSIS — F1120 Opioid dependence, uncomplicated: Secondary | ICD-10-CM

## 2013-11-18 DIAGNOSIS — F329 Major depressive disorder, single episode, unspecified: Secondary | ICD-10-CM

## 2013-11-18 NOTE — Progress Notes (Signed)
[facility] [id number] Progress Note  David Carroll [id number] 73 y.o.  11/18/2013 10:17 AM  Chief Complaint: medication change  History of Present Illness: Pt states he is doing ok. Pt has been on Brintellix for the last 15 days as prescribed by his PCP at the pt's request. Pt states he is tolerating it and denies SE. PCP also started him on Vyvanse 30mg  yesterday for fatigue. Pt is able to say yet what effect the Vyvanse is having.  He stopped Wellbutrin and Zoloft after 1.5 months without any change in mood symptoms. Pt states he could not remember when his appt was with this provider so he had his PCP change his meds.   Today reports depression is on going without change. Sleep and energy are poor. He is getting about 7 hrs of broken sleep. Appetite is fair. Denies anhedonia, worthlessness and hopelessness. States the biggest problem with his depression is his fatigue and he feels Vyvanse is the missing piece.   Was told his B12 is high and he stopped the shots 2 months ago. Pt will send over new labs results.   Has now switched to once/month appt with therapist as opposed to once a week.  Denies substance abuse at this time.   Suicidal Ideation: No Plan Formed: No Patient has means to carry out plan: No  Homicidal Ideation: No Plan Formed: No Patient has means to carry out plan: No  Review of Systems: Psychiatric: Agitation: No Hallucination: No Depressed Mood: Yes Insomnia: Yes Hypersomnia: Yes Altered Concentration: Yes Feels Worthless: No Grandiose Ideas: No Belief In Special Powers: No New/Increased Substance Abuse: No Compulsions: No  Neurologic: Headache: No Seizure: No Paresthesias: No  Past Medical History: Hypertension  .  Opiate addiction  .  Hydronephrosis, left  .  Nephrolithiasis  .  BPH (benign prostatic hypertrophy)  .  History of gastric ulcer  .  History of kidney stones  .  Urgency of urination  .  Glaucoma  .  History of  gastroesophageal reflux (GERD)  .  Arthritis  KNEES  .  Complication of anesthesia  SLOW RECOVERING FROM ANES. DRUGS  HX OPIATE ADDICTION-- PT ON SUBOXONE  .  Depression   Past Family and Social Hx: Reports family hx of depression. Pt quit smoking in 2008. He has a hx of opiate addiction and in April 2015 he stopped taking Suboxone. He drinks 1 can of beer most nights of the week. 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   Outpatient Encounter Prescriptions as of 11/18/2013  Medication Sig  . acidophilus (RISAQUAD) CAPS Take 1 capsule by mouth daily.  Marland Kitchen amLODipine (NORVASC) 5 MG tablet Take 5 mg by mouth every morning.   Marland Kitchen aspirin EC 81 MG tablet Take 81 mg by mouth every evening.   . brimonidine (ALPHAGAN P) 0.1 % SOLN Apply 1 drop to eye 2 (two) times daily.  Marland Kitchen buPROPion (WELLBUTRIN XL) 150 MG 24 hr tablet Take 2 tablets (300 mg total) by mouth every morning.  . cholecalciferol (VITAMIN D) 1000 UNITS tablet Take 2,000 Units by mouth daily.   Marland Kitchen doxazosin (CARDURA XL) 8 MG 24 hr tablet Take 8 mg by mouth at bedtime.   Marland Kitchen losartan (COZAAR) 50 MG tablet Take 50 mg by mouth daily.  . Multiple Vitamin (MULTIVITAMIN WITH MINERALS) TABS Take 1 tablet by mouth daily.  Marland Kitchen oxybutynin (DITROPAN) 5 MG tablet Take 1 tablet (5 mg total) by mouth every  8 (eight) hours as needed. For bladder spasms / stent colic  . sennosides-docusate sodium (SENOKOT-S) 8.6-50 MG tablet Take 1 tablet by mouth 2 (two) times daily. While taking pain meds to prevent constipation.  . sertraline (ZOLOFT) 100 MG tablet Take 100 mg by mouth at bedtime.     Past Psychiatric History/Hospitalization(s): Anxiety: No Bipolar Disorder: No Depression: Yes Mania: No Psychosis: No Schizophrenia: No Personality Disorder: No Hospitalization for psychiatric illness: No History of Electroconvulsive Shock Therapy: No Prior Suicide Attempts: No  Physical Exam: Constitutional:  BP 140/86  Pulse 78   Ht 5' 7.5" (1.715 m)  Wt 209 lb (94.802 kg)  BMI 32.23 kg/m2  Musculoskeletal: Strength & Muscle Tone: within normal limits Gait & Station: normal Patient leans: N/A  Mental Status Examination/Evaluation:  Objective: Appearance: Neat and Well Groomed   Eye Contact:: Good   Speech: Clear and Coherent and Normal Rate   Volume: Normal   Mood: depressed   Affect: Appropriate and Congruent   Thought Process: Goal Directed, Linear and Logical   Orientation: Full (Time, Place, and Person)   Thought Content: WDL   Suicidal Thoughts: No   Homicidal Thoughts: No   Judgement: Fair   Insight: Fair   Psychomotor Activity: Normal   Akathisia: No   Handed: Right   AIMS (if indicated): n/a   Assets: Communication Skills  Desire for Improvement  Journalist, newspaperinancial Resources/Insurance  Housing  Resilience  Social Support  Transportation      Medical Decision Making (Choose Three): Established Problem, Stable/Improving (1), Review of Psycho-Social Stressors (1), Review of Medication Regimen & Side Effects (2) and Review of New Medication or Change in Dosage (2)  Assessment: Axis I: MDD; Opiate Dependence  Axis II: deferred  Axis III: Hypertension, Opiate addiction on Suboxone, Hydronephrosis, left, Nephrolithiasis, BPH (benign prostatic hypertrophy), History of gastric ulcer, History of kidney stones, Urgency of urination, Glaucoma, History of gastroesophageal reflux (GERD), Arthritis KNEES, Complication of anesthesia, SLOW RECOVERING FROM ANES.   Axis IV: health stressors  Axis V: 51   Plan:  Plan of Care: Continue meds, risks/benefits and SE of the medication discussed. Pt verbalized understanding and verbal consent obtained for treatment.  Affirm with the patient that the medications are taken as ordered. Patient expressed understanding of how their medications were to be used.   Pt will sign relase for discussing with his new pcp. Advised pt that this provider should be the only one  managing his psychiatric medications. Will discuss with his new PCP with pt's permission.   Laboratory: had labs one week ago and will have results faxed over to us   Psychotherapy: Therapy: brief supportive therapy provided. Discussed psychosocial stressors in detail.    Medications: d/c Wellbutrin XR and Zoloft as pt stopped both meds 2 weeks ago. Pt is currently prescribed Brintillex 10mg  po qD for mood and Vyvanse 30mg  qD for fatigue by his PCP. Advised pt that until provider had discussion with PCP, the PCP would have to continue to prescribe meds.   Routine PRN Medications: No   Consultations: encouraged pt to f/up with therapist as scheduled.  Safety Concerns:Pt denies SI and is at an acute low risk for suicide.Patient told to call clinic if any problems occur. Patient advised to go to ER  if she should develop SI/HI, side effects, or if symptoms worsen. Has crisis numbers to call if needed. Pt verbalized understanding.  Other: 2 months f/up   Oletta DarterAGARWAL, Tamanna Whitson, MD 11/18/2013

## 2014-01-20 ENCOUNTER — Ambulatory Visit (HOSPITAL_COMMUNITY): Payer: Self-pay | Admitting: Psychiatry

## 2014-07-13 ENCOUNTER — Emergency Department (HOSPITAL_COMMUNITY): Payer: Medicare Other

## 2014-07-13 ENCOUNTER — Emergency Department (HOSPITAL_COMMUNITY)
Admission: EM | Admit: 2014-07-13 | Discharge: 2014-07-13 | Disposition: A | Discharge status: Home / Self Care | Payer: Medicare Other | Attending: Emergency Medicine | Admitting: Emergency Medicine

## 2014-07-13 ENCOUNTER — Encounter (HOSPITAL_COMMUNITY): Payer: Self-pay | Admitting: Emergency Medicine

## 2014-07-13 DIAGNOSIS — S43014A Anterior dislocation of right humerus, initial encounter: Secondary | ICD-10-CM | POA: Diagnosis not present

## 2014-07-13 DIAGNOSIS — Y92310 Basketball court as the place of occurrence of the external cause: Secondary | ICD-10-CM | POA: Diagnosis not present

## 2014-07-13 DIAGNOSIS — Z87891 Personal history of nicotine dependence: Secondary | ICD-10-CM | POA: Diagnosis not present

## 2014-07-13 DIAGNOSIS — M1990 Unspecified osteoarthritis, unspecified site: Secondary | ICD-10-CM | POA: Diagnosis not present

## 2014-07-13 DIAGNOSIS — Y998 Other external cause status: Secondary | ICD-10-CM | POA: Insufficient documentation

## 2014-07-13 DIAGNOSIS — I10 Essential (primary) hypertension: Secondary | ICD-10-CM | POA: Diagnosis not present

## 2014-07-13 DIAGNOSIS — W1839XA Other fall on same level, initial encounter: Secondary | ICD-10-CM | POA: Diagnosis not present

## 2014-07-13 DIAGNOSIS — Z87442 Personal history of urinary calculi: Secondary | ICD-10-CM | POA: Diagnosis not present

## 2014-07-13 DIAGNOSIS — Z8669 Personal history of other diseases of the nervous system and sense organs: Secondary | ICD-10-CM | POA: Insufficient documentation

## 2014-07-13 DIAGNOSIS — Z8659 Personal history of other mental and behavioral disorders: Secondary | ICD-10-CM | POA: Insufficient documentation

## 2014-07-13 DIAGNOSIS — S4991XA Unspecified injury of right shoulder and upper arm, initial encounter: Secondary | ICD-10-CM | POA: Diagnosis present

## 2014-07-13 DIAGNOSIS — Z87438 Personal history of other diseases of male genital organs: Secondary | ICD-10-CM | POA: Insufficient documentation

## 2014-07-13 DIAGNOSIS — Z8719 Personal history of other diseases of the digestive system: Secondary | ICD-10-CM | POA: Insufficient documentation

## 2014-07-13 DIAGNOSIS — Y9367 Activity, basketball: Secondary | ICD-10-CM | POA: Insufficient documentation

## 2014-07-13 DIAGNOSIS — S43004A Unspecified dislocation of right shoulder joint, initial encounter: Secondary | ICD-10-CM

## 2014-07-13 MED ORDER — HYDROMORPHONE HCL 1 MG/ML IJ SOLN
1.0000 mg | Freq: Once | INTRAMUSCULAR | Status: AC
  Administered 2014-07-13: 1 mg via INTRAVENOUS
  Filled 2014-07-13: qty 1

## 2014-07-13 MED ORDER — PROPOFOL 10 MG/ML IV BOLUS
100.0000 mg | Freq: Once | INTRAVENOUS | Status: AC
  Administered 2014-07-13: 50 mg via INTRAVENOUS
  Filled 2014-07-13: qty 1

## 2014-07-13 MED ORDER — OXYCODONE-ACETAMINOPHEN 5-325 MG PO TABS: 1.0000 | ORAL_TABLET | Freq: Four times a day (QID) | ORAL | Status: DC | PRN

## 2014-07-13 NOTE — ED Notes (Signed)
DIPROVAN MEDICATION REMAINDER GIVEN TO SABAH PARMACY TECH. WITNESSED [facility] NURSING STUDENT.

## 2014-07-13 NOTE — ED Provider Notes (Signed)
CSN: [id number]     Arrival date & time 07/13/14  1222 History   First MD Initiated Contact with Patient 07/13/14 1235     Chief Complaint  Patient presents with  . Shoulder Injury     (Consider location/radiation/quality/duration/timing/severity/associated sxs/prior Treatment) The history is provided by the patient.  David Carroll is a 74 y.o. male hx of HTN, opiate addiction on suboxone here with R shoulder injury. He was playing basketball and fell and landed on the right shoulder. Denies head or neck injury or other injuries. He was given fentanyl 250 mcg by EMS.    Past Medical History  Diagnosis Date  . Hypertension   . Opiate addiction   . Hydronephrosis, left   . Nephrolithiasis   . BPH (benign prostatic hypertrophy)   . History of gastric ulcer   . History of kidney stones   . Urgency of urination   . Glaucoma   . History of gastroesophageal reflux (GERD)   . Arthritis     KNEES  . Complication of anesthesia SLOW RECOVERING FROM ANES. DRUGS    HX OPIATE ADDICTION-- PT ON SUBOXONE  . Depression    Past Surgical History  Procedure Laterality Date  . Lumbar disc surgery  09-24-2008    L3 -- L4  . Medial compartment arthroplasty, left knee  02-22-2010  . Extracorporeal shock wave lithotripsy Left 03-06-2011  . Cataract extraction w/ intraocular lens  implant, bilateral    . Vagotomy  1986    ULCERS  . Removal benign mass left lung  1977  . Inguinal hernia repair Bilateral 1970    RE-DO LEFT 1985  . Holmium laser application Left 11/06/2012    Procedure: HOLMIUM LASER APPLICATION;  Surgeon: Sebastian Ache, MD;  Location: [facility];  Service: Urology;  Laterality: Left;  . Cystoscopy with biopsy Left 11/06/2012    Procedure: CYSTOSCOPY WITH BIOPSY;  Surgeon: Sebastian Ache, MD;  Location: [facility];  Service: Urology;  Laterality: Left;   Family History  Problem Relation Age of Onset  . Depression Mother    History  Substance Use  Topics  . Smoking status: Former Smoker -- 2.00 packs/day for 40 years    Types: Cigarettes    Quit date: 10/31/2006  . Smokeless tobacco: Never Used  . Alcohol Use: 4.2 oz/week    7 Cans of beer per week    Review of Systems  Musculoskeletal:       R shoulder pain   All other systems reviewed and are negative.     Allergies  Review of patient's allergies indicates no known allergies.  Home Medications   Prior to Admission medications   Medication Sig Start Date End Date Taking? Authorizing Provider  acidophilus (RISAQUAD) CAPS Take 1 capsule by mouth daily.    Historical Provider, MD  amLODipine (NORVASC) 5 MG tablet Take 5 mg by mouth every morning.     Historical Provider, MD  aspirin EC 81 MG tablet Take 81 mg by mouth every evening.     Historical Provider, MD  brimonidine (ALPHAGAN P) 0.1 % SOLN Apply 1 drop to eye 2 (two) times daily.    Historical Provider, MD  cholecalciferol (VITAMIN D) 1000 UNITS tablet Take 2,000 Units by mouth daily.     Historical Provider, MD  co-enzyme Q-10 30 MG capsule Take 30 mg by mouth 3 (three) times daily.    Historical Provider, MD  doxazosin (CARDURA XL) 8 MG 24 hr tablet Take 8 mg  by mouth at bedtime.     Historical Provider, MD  lisdexamfetamine (VYVANSE) 30 MG capsule Take 30 mg by mouth daily.    Historical Provider, MD  losartan (COZAAR) 50 MG tablet Take 50 mg by mouth daily.    Historical Provider, MD  Multiple Vitamin (MULTIVITAMIN WITH MINERALS) TABS Take 1 tablet by mouth daily.    Historical Provider, MD  oxybutynin (DITROPAN) 5 MG tablet Take 1 tablet (5 mg total) by mouth every 8 (eight) hours as needed. For bladder spasms / stent colic 11/06/12   Sebastian Ache, MD  sennosides-docusate sodium (SENOKOT-S) 8.6-50 MG tablet Take 1 tablet by mouth 2 (two) times daily. While taking pain meds to prevent constipation. 11/06/12   Sebastian Ache, MD  Vortioxetine HBr (BRINTELLIX) 10 MG TABS Take 10 mg by mouth daily.    Historical  Provider, MD   BP 157/78 mmHg  Pulse 56  Temp(Src)   Resp 12  Ht  (1.727 m)  Wt 205 lb (92.987 kg)  BMI 31.18 kg/m2  SpO2 100% Physical Exam  Constitutional: He is oriented to person, place, and time.  Uncomfortable   HENT:  Head: Normocephalic and atraumatic.  Mouth/Throat: Oropharynx is clear and moist.  Eyes: Conjunctivae are normal. Pupils are equal, round, and reactive to light.  Neck: Neck supple.  Cardiovascular: Normal rate, regular rhythm and normal heart sounds.   Pulmonary/Chest: Effort normal and breath sounds normal. No respiratory distress. He has no wheezes. He has no rales.  Abdominal: Soft. Bowel sounds are normal. He exhibits no distension. There is no tenderness. There is no rebound.  Musculoskeletal:  R shoulder deformed. ? Dislocated. Proximal humerus tenderness, ? Deformity. 2+ pulses, nontender elbow. Able to do hand grasp   Neurological: He is alert and oriented to person, place, and time.  Skin: Skin is warm and dry.  Psychiatric: He has a normal mood and affect. His behavior is normal. Judgment and thought content normal.  Nursing note and vitals reviewed.   ED Course  Reduction of dislocation Date/Time: 07/13/2014 3:06 PM Performed by: Richardean Canal Authorized by: Richardean Canal Consent: Verbal consent obtained. Risks and benefits: risks, benefits and alternatives were discussed Consent given by: patient Patient understanding: patient states understanding of the procedure being performed Patient consent: the patient's understanding of the procedure matches consent given Procedure consent: procedure consent matches procedure scheduled Patient identity confirmed: verbally with patient and arm band Time out: Immediately prior to procedure a "time out" was called to verify the correct patient, procedure, equipment, support staff and site/side marked as required. Preparation: Patient was prepped and draped in the usual sterile fashion. Local anesthesia  used: no Patient sedated: yes Sedation type: moderate (conscious) sedation Sedatives: propofol Vitals: Vital signs were monitored during sedation. Patient tolerance: Patient tolerated the procedure well with no immediate complications Comments: I performed traction, counter traction, able to reduce. Confirmed by xray.    (including critical care time) Labs Review Labs Reviewed - No data to display  Imaging Review Dg Shoulder Right  07/13/2014   CLINICAL DATA:  Larey Seat playing basketball today landing on RIGHT shoulder, RIGHT shoulder pain, RIGHT shoulder injury, initial encounter  EXAM: RIGHT SHOULDER - 2+ VIEW  COMPARISON:  None  FINDINGS: Anterior inferior RIGHT glenohumeral dislocation.  Slight osseous demineralization.  AC joint alignment normal.  Inferior acromial spur formation.  No acute fracture or bone destruction.  Visualized LEFT ribs intact.  IMPRESSION: Anterior inferior glenohumeral dislocation RIGHT shoulder.   Electronically Signed  By: Ulyses SouthwardMark  Boles M.D.   On: 07/13/2014 13:58   Dg Shoulder Right Port  07/13/2014   CLINICAL DATA:  Post reduction of RIGHT shoulder  EXAM: PORTABLE RIGHT SHOULDER - 2+ VIEW  COMPARISON:  Portable exam 1433 hr compared earlier study of 07/13/2014  FINDINGS: Previously identified anterior inferior RIGHT glenohumeral dislocation appears reduced.  AC joint alignment normal.  No fracture seen.  Osseous mineralization normal.  IMPRESSION: Reduction of previously identified RIGHT glenohumeral dislocation.   Electronically Signed   By: Ulyses SouthwardMark  Boles M.D.   On: 07/13/2014 15:00   Dg Humerus Right  07/13/2014   CLINICAL DATA:  Right shoulder pain after falling playing basketball today. Initial encounter.  EXAM: RIGHT HUMERUS - 2+ VIEW  COMPARISON:  None.  FINDINGS: Anterior inferior glenohumeral dislocation is better demonstrated on the concurrent shoulder radiographs. The distal humerus is intact. There is no evidence acute fracture.  IMPRESSION: Right glenohumeral  dislocation.  No evidence of acute fracture.   Electronically Signed   By: Roxy HorsemanBill  Veazey M.D.   On: 07/13/2014 13:59     EKG Interpretation None      MDM   Final diagnoses:  Shoulder injury, right, initial encounter    Geronimo BootMichael Basara is a 74 y.o. male here with R shoulder injury. Consider dislocation vs fracture. Will get xrays.   3:05 PM Xray showed dislocation. Used propofol for conscious sedation and able to relocated shoulder. Will dc with short course of pain meds and ortho f/u. Patient remained in shoulder immobilizer.     Richardean Canalavid H Taje Tondreau, MD 07/13/14 [phone]

## 2014-07-13 NOTE — ED Notes (Signed)
Bed: WA06 Expected date:  Expected time:  Means of arrival:  Comments: EMS-dislocated shoulder

## 2014-07-13 NOTE — ED Notes (Signed)
MD at bedside. EDP YAO PRESENT TO SPEAK WITH PT 

## 2014-07-13 NOTE — ED Notes (Addendum)
Pt has addiction to opiates. Pt states was with pain management. No longer. Pt denies not have regular RX for suboxone. Does not take regularly. Last dose yesterday. WIFE DOES NOT KNOW ABOUT ADDICTION OR MEDICATION

## 2014-07-13 NOTE — Discharge Instructions (Signed)
Take percocet for severe pain.   Keep your arm in a sling at all times.   Follow up with orthopedic doctor in a week.   Return to ER if you have severe pain, dislocation, numbness of hand.

## 2014-07-13 NOTE — ED Notes (Addendum)
Per EMS-playing basketball and fell on right shoulder-deformity-250 mg of Fentanyl given in route-patient states he has an opiate addiction and does not want wife to know he is on suboxone-did not hit head and no LOC-good distal pulses and grip with right hand

## 2019-03-05 ENCOUNTER — Ambulatory Visit: Payer: Medicare Other | Attending: Neurosurgery | Admitting: Physical Therapy

## 2019-03-05 ENCOUNTER — Other Ambulatory Visit: Payer: Self-pay

## 2019-03-05 ENCOUNTER — Encounter: Payer: Self-pay | Admitting: Physical Therapy

## 2019-03-05 DIAGNOSIS — M545 Low back pain, unspecified: Secondary | ICD-10-CM

## 2019-03-05 DIAGNOSIS — G8929 Other chronic pain: Secondary | ICD-10-CM | POA: Insufficient documentation

## 2019-03-05 NOTE — Therapy (Signed)
[facility] [address] [place], [place], [address] Phone: [phone]   Fax:  [phone]  Physical Therapy Evaluation  Patient Details  Name: David Carroll MRN: [id number] Date of Birth: 1940/08/17 Referring Provider (PT): Rockne Menghini   Encounter Date: 03/05/2019  PT End of Session - 03/05/19 1323    Visit Number  1    Number of Visits  12    Date for PT Re-Evaluation  06/03/19    Authorization Type  PROGRESS NOTE AT 10TH VISIT.  KX MODIFIER AFTER 15 VISITS.    PT Start Time  1111    PT Stop Time  1204    PT Time Calculation (min)  53 min       Past Medical History:  Diagnosis Date  . Arthritis    KNEES  . BPH (benign prostatic hypertrophy)   . Complication of anesthesia SLOW RECOVERING FROM ANES. DRUGS   HX OPIATE ADDICTION-- PT ON SUBOXONE  . Depression   . Glaucoma   . History of gastric ulcer   . History of gastroesophageal reflux (GERD)   . History of kidney stones   . Hydronephrosis, left   . Hypertension   . Nephrolithiasis   . Opiate addiction (HCC)   . Urgency of urination     Past Surgical History:  Procedure Laterality Date  . CATARACT EXTRACTION W/ INTRAOCULAR LENS  IMPLANT, BILATERAL    . CYSTOSCOPY WITH BIOPSY Left 11/06/2012   Procedure: CYSTOSCOPY WITH BIOPSY;  Surgeon: Sebastian Ache, MD;  Location: [facility];  Service: Urology;  Laterality: Left;  . EXTRACORPOREAL SHOCK WAVE LITHOTRIPSY Left 03-06-2011  . HOLMIUM LASER APPLICATION Left 11/06/2012   Procedure: HOLMIUM LASER APPLICATION;  Surgeon: Sebastian Ache, MD;  Location: [facility];  Service: Urology;  Laterality: Left;  . INGUINAL HERNIA REPAIR Bilateral 1970   RE-DO LEFT 1985  . LUMBAR DISC SURGERY  09-24-2008   L3 -- L4  . MEDIAL COMPARTMENT ARTHROPLASTY, LEFT KNEE  02-22-2010  . REMOVAL BENIGN MASS LEFT LUNG  1977  . VAGOTOMY  1986   ULCERS    There were no vitals filed for this  visit.   Subjective Assessment - 03/05/19 1338    Subjective  COVID-19 screen performed prior to patient entering clinic.  The patient presents to the clinic today with c/o low back pain worsening over the last 6 months.  He reports pain across his low back and numbness on the bottom of her right foot. His pain increases to near severe levels with increased activity.  He had an epidural injcetion about a week ago that he found helpful.  He reports several falls over the last 6 months.  He states that "now i watch where my feet are" and sttaes he has not fallen recently.  Recommended he use a cane for safety.         [facility] PT Assessment - 03/05/19 0001      Assessment   Medical Diagnosis  Spinal stenosis of lumbar region with neurogenic claudication.    Referring Provider (PT)  Rockne Menghini    Onset Date/Surgical Date  --   ~6 months.     Precautions   Precautions  Fall      Restrictions   Weight Bearing Restrictions  No      Balance Screen   Has the patient fallen in the past 6 months  Yes    How many times?  --   6-8 times.   Has  the patient had a decrease in activity level because of a fear of falling?   Yes    Is the patient reluctant to leave their home because of a fear of falling?   No      Home Environment   Living Environment  Private residence      Prior Function   Level of Independence  Independent      Posture/Postural Control   Posture Comments  Posterior pelvic tilt.  Loss of lumbar lordosis.      Deep Tendon Reflexes   DTR Assessment Site  Patella;Achilles    Patella DTR  0    Achilles DTR  0      ROM / Strength   AROM / PROM / Strength  AROM;Strength      AROM   Overall AROM Comments  Lumbar flexion limited by 50% and extension to 10 degrees.      Strength   Overall Strength Comments  Normal bilateral LE strength.      Palpation   Palpation comment  Very tender to palption over bilateral SIJ's.      Special Tests   Other special tests  (=)  leg lengths, (-) SLR testing, (-) FABER and FADIR testing. (-)Romberg test.      Ambulation/Gait   Gait Comments  Slow and cautious slightly shuffled gait pattern.                Objective measurements completed on examination: See above findings.      [facility] Adult PT Treatment/Exercise - 03/05/19 0001      Modalities   Modalities  Electrical Stimulation;Moist Heat      Moist Heat Therapy   Number Minutes Moist Heat  20 Minutes    Moist Heat Location  Lumbar Spine      Electrical Stimulation   Electrical Stimulation Location  Lower lumbar.    Electrical Stimulation Action  IFC    Electrical Stimulation Parameters  80-150 Hz x 20 minutes.    Electrical Stimulation Goals  Pain               PT Short Term Goals - 03/05/19 1444      PT SHORT TERM GOAL #1   Title  STG's=LTG's.        PT Long Term Goals - 03/05/19 1444      PT LONG TERM GOAL #1   Title  Independent with an HEP.    Time  6    Period  Weeks    Status  New      PT LONG TERM GOAL #2   Title  Stand 20 minutes with pain not > 3/10.    Time  6    Period  Weeks    Status  New      PT LONG TERM GOAL #3   Title  Perform ADL's with pain not > 4/10.    Time  6    Period  Weeks    Status  New             Plan - 03/05/19 1408    Clinical Impression Statement  The patient presents to OPPT with c/o bilateral low back pain that has been worsening over the last 6 months.  He his very tender to palption over both SIJ's.  Special testing is negative.  He has limitations of spinal range of motion and posterior pelvic tilting resulting in a dewcrease in lumbar lordosis.  Recommended he use a cane due to  falls.  Patient will benefit from skilled physical therapy intervention to address deficits and pain.    Personal Factors and Comorbidities  Comorbidity 1    Comorbidities  Left TKA, lumbar surgery, HTN.    Examination-Activity Limitations  Other;Stand    Examination-Participation Restrictions   Other    Stability/Clinical Decision Making  Evolving/Moderate complexity    Clinical Decision Making  Low    Rehab Potential  Good    PT Frequency  2x / week    PT Duration  6 weeks    PT Treatment/Interventions  ADLs/Self Care Home Management;Cryotherapy;Electrical Stimulation;Ultrasound;Traction;Moist Heat;Therapeutic activities;Therapeutic exercise;Manual techniques;Patient/family education;Passive range of motion;Dry needling;Spinal Manipulations    PT Next Visit Plan  S and DKTC, hip bridges, combo e'stim/U/S to bilateral SIJ's, modalites and STW/M as needed.  Int traction is an option beginning at 35% bodyweight    Consulted and Agree with Plan of Care  Patient       Patient will benefit from skilled therapeutic intervention in order to improve the following deficits and impairments:  Pain, Decreased mobility, Decreased activity tolerance, Decreased range of motion, Postural dysfunction  Visit Diagnosis: Chronic bilateral low back pain without sciatica - Plan: PT plan of care cert/re-cert     Problem List Patient Active Problem List   Diagnosis Date Noted  . MDD (major depressive disorder) 09/18/2013  . Opiate dependence ([place]) 09/18/2013    APPLEGATE, [place] MPT 03/05/2019, 2:47 PM  [facility] [address] [place], [place], [address] Phone: [phone]   Fax:  [phone]  Name: Isabella Ida MRN: [id number] Date of Birth: [birth year]

## 2019-03-14 ENCOUNTER — Ambulatory Visit: Payer: Medicare Other | Attending: Neurosurgery | Admitting: Physical Therapy

## 2019-03-14 ENCOUNTER — Encounter: Payer: Self-pay | Admitting: Physical Therapy

## 2019-03-14 ENCOUNTER — Other Ambulatory Visit: Payer: Self-pay

## 2019-03-14 DIAGNOSIS — G8929 Other chronic pain: Secondary | ICD-10-CM | POA: Insufficient documentation

## 2019-03-14 DIAGNOSIS — M545 Low back pain: Secondary | ICD-10-CM | POA: Diagnosis present

## 2019-03-14 NOTE — Therapy (Signed)
[facility] [address] [place], [place], [address] Phone: [phone]   Fax:  [phone]  Physical Therapy Treatment  Patient Details  Name: David Carroll MRN: [id number] Date of Birth: [birth year] Referring Provider (PT): Rockne MenghiniMichaux Kilpatrick   Encounter Date: 03/14/2019  PT End of Session - 03/14/19 1223    Visit Number  2    Number of Visits  12    Date for PT Re-Evaluation  06/03/19    Authorization Type  PROGRESS NOTE AT 10TH VISIT.  KX MODIFIER AFTER 15 VISITS.    PT Start Time  1030    PT Stop Time  1118    PT Time Calculation (min)  48 min    Activity Tolerance  Patient tolerated treatment well    Behavior During Therapy  WFL for tasks assessed/performed       Past Medical History:  Diagnosis Date  . Arthritis    KNEES  . BPH (benign prostatic hypertrophy)   . Complication of anesthesia SLOW RECOVERING FROM ANES. DRUGS   HX OPIATE ADDICTION-- PT ON SUBOXONE  . Depression   . Glaucoma   . History of gastric ulcer   . History of gastroesophageal reflux (GERD)   . History of kidney stones   . Hydronephrosis, left   . Hypertension   . Nephrolithiasis   . Opiate addiction (HCC)   . Urgency of urination     Past Surgical History:  Procedure Laterality Date  . CATARACT EXTRACTION W/ INTRAOCULAR LENS  IMPLANT, BILATERAL    . CYSTOSCOPY WITH BIOPSY Left 11/06/2012   Procedure: CYSTOSCOPY WITH BIOPSY;  Surgeon: Sebastian Acheheodore Manny, MD;  [facility];  Service: Urology;  Laterality: Left;  . EXTRACORPOREAL SHOCK WAVE LITHOTRIPSY Left 03-06-2011  . HOLMIUM LASER APPLICATION Left 11/06/2012   Procedure: HOLMIUM LASER APPLICATION;  Surgeon: Sebastian Acheheodore Manny, MD;  [facility] ;  Service: Urology;  Laterality: Left;  . INGUINAL HERNIA REPAIR Bilateral 1970   RE-DO LEFT 1985  . LUMBAR DISC SURGERY  09-24-2008   L3 -- L4  . MEDIAL COMPARTMENT ARTHROPLASTY, LEFT KNEE  02-22-2010  .  REMOVAL BENIGN MASS LEFT LUNG  1977  . VAGOTOMY  1986   ULCERS    There were no vitals filed for this visit.  Subjective Assessment - 03/14/19 1220    Subjective  COVID-19 screening performed upon arrival.  Patient reports having a really bad day due to stomach pains and is unsure about whether it is connected to the back.    Pertinent History  Left TKA, lumbar surgery, HTN.    How long can you stand comfortably?  20 minutes.    How long can you walk comfortably?  Short community distances.    Patient Stated Goals  Get out of pain.    Currently in Pain?  Yes    Pain Score  7     Pain Location  Back    Pain Orientation  Right;Left    Pain Descriptors / Indicators  Aching    Pain Type  Chronic pain    Pain Onset  More than a month ago    Pain Frequency  Constant         [facility] PT Assessment - 03/14/19 0001      Assessment   Medical Diagnosis  Spinal stenosis of lumbar region with neurogenic claudication.    Referring Provider (PT)  Rockne MenghiniMichaux Kilpatrick      Precautions   Precautions  Fall  [facility] Adult PT Treatment/Exercise - 03/14/19 0001      Modalities   Modalities  Electrical Stimulation;Moist Heat;Ultrasound      Moist Heat Therapy   Number Minutes Moist Heat  10 Minutes    Moist Heat Location  Lumbar Spine      Electrical Stimulation   Electrical Stimulation Location  lower lumbar    Electrical Stimulation Action  IFC    Electrical Stimulation Parameters  80-150 hz x10 mins    Electrical Stimulation Goals  Pain      Ultrasound   Ultrasound Location  bilateral SIJ    Ultrasound Parameters  1.5 w/cm2 100% x12 mins    Ultrasound Goals  Pain      Manual Therapy   Manual Therapy  Soft tissue mobilization    Soft tissue mobilization  STW/M to bilateral lumbar paraspinals and SIJ to decrease pain                PT Short Term Goals - 03/05/19 1444      PT SHORT TERM GOAL #1   Title  STG's=LTG's.        PT Long Term  Goals - 03/05/19 1444      PT LONG TERM GOAL #1   Title  Independent with an HEP.    Time  6    Period  Weeks    Status  New      PT LONG TERM GOAL #2   Title  Stand 20 minutes with pain not > 3/10.    Time  6    Period  Weeks    Status  New      PT LONG TERM GOAL #3   Title  Perform ADL's with pain not > 4/10.    Time  6    Period  Weeks    Status  New            Plan - 03/14/19 1224    Clinical Impression Statement  Patient was able to tolerate treatment fairly well. Patient responded well to [place] with no adverse affects. Patient noted with left trigger point in left lumbar paraspinals that was tender to touch that reduced after STW/M. Patient discussed with [place] App[place], MPT regarding his stomach pains. No adverse affects upon removal of modalities.    Personal Factors and Comorbidities  Comorbidity 1    Comorbidities  Left TKA, lumbar surgery, HTN.    Examination-Activity Limitations  Other;Stand    Examination-Participation Restrictions  Other    Stability/Clinical Decision Making  Evolving/Moderate complexity    Clinical Decision Making  Low    Rehab Potential  Good    PT Frequency  2x / week    PT Duration  6 weeks    PT Treatment/Interventions  ADLs/Self Care Home Management;Cryotherapy;Electrical Stimulation;Ultrasound;Traction;Moist Heat;Therapeutic activities;Therapeutic exercise;Manual techniques;Patient/family education;Passive range of motion;Dry needling;Spinal Manipulations    PT Next Visit Plan  S and DKTC, hip bridges, combo e'stim/U/S to bilateral SIJ's, modalites and STW/M as needed.  Int traction is an option beginning at 35% bodyweight    Consulted and Agree with Plan of Care  Patient       Patient will benefit from skilled therapeutic intervention in order to improve the following deficits and impairments:  Pain, Decreased mobility, Decreased activity tolerance, Decreased range of motion, Postural dysfunction  Visit Diagnosis: Chronic bilateral  low back pain without sciatica     Problem List Patient Active Problem List   Diagnosis Date Noted  . MDD (major  depressive disorder) 09/18/2013  . Opiate dependence ([place]) 09/18/2013    Gabriela Eves, PT, DPT 03/14/2019, 12:32 PM  [facility] [place], [place], [address] Phone: [phone]   Fax:  [phone]  Name: David Carroll MRN: [id number] Date of Birth: 1941/04/23

## 2019-03-28 ENCOUNTER — Ambulatory Visit: Payer: Medicare Other | Admitting: Physical Therapy

## 2019-03-28 ENCOUNTER — Encounter: Payer: Medicare Other | Admitting: Physical Therapy

## 2019-04-01 ENCOUNTER — Other Ambulatory Visit: Payer: Self-pay

## 2019-04-01 ENCOUNTER — Ambulatory Visit: Payer: Medicare Other | Admitting: Physical Therapy

## 2019-04-14 ENCOUNTER — Ambulatory Visit: Payer: Medicare Other | Admitting: Physical Therapy

## 2019-05-15 HISTORY — PX: LUMBAR LAMINECTOMY: SHX95

## 2019-06-18 ENCOUNTER — Ambulatory Visit: Payer: Medicare Other | Attending: Nurse Practitioner | Admitting: Physical Therapy

## 2019-06-18 ENCOUNTER — Other Ambulatory Visit: Payer: Self-pay

## 2019-06-18 ENCOUNTER — Encounter: Payer: Self-pay | Admitting: Physical Therapy

## 2019-06-18 DIAGNOSIS — G8929 Other chronic pain: Secondary | ICD-10-CM

## 2019-06-18 DIAGNOSIS — M6281 Muscle weakness (generalized): Secondary | ICD-10-CM | POA: Diagnosis present

## 2019-06-18 DIAGNOSIS — M545 Low back pain: Secondary | ICD-10-CM | POA: Diagnosis present

## 2019-06-18 DIAGNOSIS — M5442 Lumbago with sciatica, left side: Secondary | ICD-10-CM | POA: Diagnosis not present

## 2019-06-18 NOTE — Therapy (Signed)
[facility] [place], [place], [address] Phone: [phone]   Fax:  [phone]  Physical Therapy Evaluation  Patient Details  Name: David Carroll MRN: [id number] Date of Birth: Aug 10, 1940 Referring Provider (PT): Milas Kocher, NP   Encounter Date: 06/18/2019  PT End of Session - 06/18/19 1430    Visit Number  1    Number of Visits  12    Date for PT Re-Evaluation  08/06/19    Authorization Type  FOTO; PROGRESS NOTE AT 10TH VISIT.  KX MODIFIER AFTER 15 VISITS.    PT Start Time  1300    PT Stop Time  1342    PT Time Calculation (min)  42 min    Activity Tolerance  Patient tolerated treatment well    Behavior During Therapy  WFL for tasks assessed/performed       Past Medical History:  Diagnosis Date  . Arthritis    KNEES  . BPH (benign prostatic hypertrophy)   . Complication of anesthesia SLOW RECOVERING FROM ANES. DRUGS   HX OPIATE ADDICTION-- PT ON SUBOXONE  . Depression   . Glaucoma   . History of gastric ulcer   . History of gastroesophageal reflux (GERD)   . History of kidney stones   . Hydronephrosis, left   . Hypertension   . Nephrolithiasis   . Opiate addiction ([place])   . Urgency of urination     Past Surgical History:  Procedure Laterality Date  . CATARACT EXTRACTION W/ INTRAOCULAR LENS  IMPLANT, BILATERAL    . CYSTOSCOPY WITH BIOPSY Left 11/06/2012   Procedure: CYSTOSCOPY WITH BIOPSY;  Surgeon: Alexis Frock, MD;  Location: [facility];  Service: Urology;  Laterality: Left;  . EXTRACORPOREAL SHOCK WAVE LITHOTRIPSY Left 03-06-2011  . HOLMIUM LASER APPLICATION Left 0/03/2724   Procedure: HOLMIUM LASER APPLICATION;  Surgeon: Alexis Frock, MD;  Location: [facility];  Service: Urology;  Laterality: Left;  . INGUINAL HERNIA REPAIR Bilateral 1970   RE-DO LEFT 1985  . LUMBAR DISC SURGERY  09-24-2008   L3 -- L4  . LUMBAR LAMINECTOMY  05/15/2019   with partial facet  joint removal  . MEDIAL COMPARTMENT ARTHROPLASTY, LEFT KNEE  02-22-2010  . REMOVAL BENIGN MASS LEFT LUNG  1977  . VAGOTOMY  1986   ULCERS    There were no vitals filed for this visit.   Subjective Assessment - 06/18/19 1414    Subjective  COVID-19 screening performed upon arrival.Patient arrives to physical therapy with reports of lower back pain, bilateral LE weakness and left LE numbness. Patient is s/p laminectomy with partial facet joint removal for resection of bone spurs on 05/15/2019. Patient reports weakness and a lack of mobility since the surgery. Patient has difficulties with lower body dressing particularly with putting his shoes and socks on. Patient reports pain at worst as 3/10 and pain at best as 0/10. Patient's goals are to decrease pain, improve movement, improve ability to perform home activities, and return to going to the [facility] for exercise and gardening.    Pertinent History  Lumbar laminectomy L4-L5 05/15/2019, Left TKA, HTN    Limitations  House hold activities;Walking    Patient Stated Goals  improve mobility and return to exercise routine at the [facility]    Currently in Pain?  No/denies    Aggravating Factors   "any walking, bending or lifting"    Pain Relieving Factors  "nothing"         [facility] PT Assessment -  06/18/19 0001      Assessment   Medical Diagnosis  Disc Degeneration, lumbar    Referring Provider (PT)  Rueben Bash, NP    Onset Date/Surgical Date  05/15/19    Next MD Visit  07/05/2019    Prior Therapy  yes      Precautions   Precautions  Fall;Back    Precaution Comments  No bending, twisting, and liftinger greater than 15 lb      Restrictions   Weight Bearing Restrictions  No      Balance Screen   Has the patient fallen in the past 6 months  Yes    How many times?  10    Has the patient had a decrease in activity level because of a fear of falling?   Yes    Is the patient reluctant to leave their home because of a fear of falling?   No       Home Public house manager residence    Living Arrangements  Spouse/significant other      Prior Function   Level of Independence  Independent with basic ADLs      Observation/Other Assessments   Focus on Therapeutic Outcomes (FOTO)   52% limitation      Deep Tendon Reflexes   DTR Assessment Site  --    Patella DTR  --    Achilles DTR  --      ROM / Strength   AROM / PROM / Strength  Strength      Strength   Overall Strength  Deficits    Strength Assessment Site  Hip;Knee    Right/Left Hip  Left;Right    Right Hip Flexion  3+/5    Right Hip ABduction  3/5    Left Hip Flexion  3/5    Left Hip ABduction  3-/5    Right/Left Knee  Right;Left    Right Knee Flexion  4/5    Right Knee Extension  4/5    Left Knee Flexion  3+/5    Left Knee Extension  3+/5      Palpation   Palpation comment  tender to palpation to bilateral lumbar paraspinals with increased tone, tenderness to bilateral QLs      Ambulation/Gait   Gait Pattern  Step-through pattern;Decreased stride length;Shuffle;Trunk flexed                Objective measurements completed on examination: See above findings.              PT Education - 06/18/19 1422    Education Details  draw in, supine marching, bridging, log rolling    Person(s) Educated  Patient    Methods  Explanation;Demonstration;Handout    Comprehension  Verbalized understanding;Returned demonstration       PT Short Term Goals - 03/05/19 1444      PT SHORT TERM GOAL #1   Title  STG's=LTG's.        PT Long Term Goals - 06/18/19 1433      PT LONG TERM GOAL #1   Title  Patient will be independent with HEP    Time  6    Period  Weeks    Status  New      PT LONG TERM GOAL #2   Title  Patient will report ability to perform ADLs and lower body dressing with low back pain less than or equal to 2/10.    Time  6  Status  New      PT LONG TERM GOAL #3   Title  Patient will demonstrate proper bed  mobility and log rolling technique to protect spine during transitional movements.    Time  6    Period  Weeks    Status  New             Plan - 06/18/19 1431    Clinical Impression Statement  Patient is a 79 year old male who presents to physical therapy with bilateral low back pain, neurological symptoms to left foot and decreased bilateral MMT left weaker than right. Patient demonstrates slow transitional movements for sit to stand as well as bed mobility. Patient demonstrates a supine to long sit transition and was educated on proper log rolling technique. Patient tender to palpation to bilateral QLs as well as lumbar paraspinals. Patient's incision has moderate scabbing and redness around the perimeter of the incision. Patient and PT reviewed HEP to which patient reported understanding. Patient would benefit from skilled physical therapy to address deficits and address patient's goals.    Personal Factors and Comorbidities  Comorbidity 1    Comorbidities  lumbar laminectomy L4-L5 05/15/2019; left TKA, HTN    Examination-Activity Limitations  Other;Stand    Examination-Participation Restrictions  Other    Stability/Clinical Decision Making  Evolving/Moderate complexity    Clinical Decision Making  Low    Rehab Potential  Good    PT Frequency  2x / week    PT Duration  6 weeks    PT Treatment/Interventions  ADLs/Self Care Home Management;Cryotherapy;Electrical Stimulation;Ultrasound;Traction;Moist Heat;Therapeutic activities;Therapeutic exercise;Manual techniques;Patient/family education;Passive range of motion;Dry needling;Spinal Manipulations    PT Next Visit Plan  Nustep, core strengthening and stability, LE strengthening, modalities PRN for pain relief.    PT Home Exercise Plan  see patient education section    Consulted and Agree with Plan of Care  Patient       Patient will benefit from skilled therapeutic intervention in order to improve the following deficits and impairments:   Pain, Decreased mobility, Decreased activity tolerance, Decreased range of motion, Postural dysfunction  Visit Diagnosis: Chronic bilateral low back pain with left-sided sciatica  Muscle weakness (generalized)     Problem List Patient Active Problem List   Diagnosis Date Noted  . MDD (major depressive disorder) 09/18/2013  . Opiate dependence (HCC) 09/18/2013    Guss Bunde, PT, DPT 06/18/2019, 3:11 PM  [facility] [address] [place], [place], [address] Phone: [phone]   Fax:  [phone]  Name: David Carroll MRN: [id number] Date of Birth: 1940-07-07

## 2019-06-19 NOTE — Addendum Note (Signed)
Addended by: Guss Bunde on: 06/19/2019 08:51 AM   Modules accepted: Orders

## 2019-06-23 ENCOUNTER — Ambulatory Visit: Payer: Medicare Other | Admitting: Physical Therapy

## 2019-06-23 ENCOUNTER — Other Ambulatory Visit: Payer: Self-pay

## 2019-06-23 DIAGNOSIS — G8929 Other chronic pain: Secondary | ICD-10-CM

## 2019-06-23 DIAGNOSIS — M5442 Lumbago with sciatica, left side: Secondary | ICD-10-CM | POA: Diagnosis not present

## 2019-06-23 DIAGNOSIS — M6281 Muscle weakness (generalized): Secondary | ICD-10-CM

## 2019-06-23 NOTE — Therapy (Signed)
[facility] [place], [place], [address] Phone: [phone]   Fax:  [phone]  Physical Therapy Treatment  Patient Details  Name: David Carroll MRN: [id number] Date of Birth: Mar 19, 1941 Referring Provider (PT): Milas Kocher, NP   Encounter Date: 06/23/2019  PT End of Session - 06/23/19 1411    Visit Number  2    Number of Visits  12    Date for PT Re-Evaluation  08/06/19    Authorization Type  FOTO; PROGRESS NOTE AT 10TH VISIT.  KX MODIFIER AFTER 15 VISITS.    PT Start Time  1400   late arrival   PT Stop Time  1434    PT Time Calculation (min)  34 min    Activity Tolerance  Patient tolerated treatment well    Behavior During Therapy  WFL for tasks assessed/performed       Past Medical History:  Diagnosis Date  . Arthritis    KNEES  . BPH (benign prostatic hypertrophy)   . Complication of anesthesia SLOW RECOVERING FROM ANES. DRUGS   HX OPIATE ADDICTION-- PT ON SUBOXONE  . Depression   . Glaucoma   . History of gastric ulcer   . History of gastroesophageal reflux (GERD)   . History of kidney stones   . Hydronephrosis, left   . Hypertension   . Nephrolithiasis   . Opiate addiction ([place])   . Urgency of urination     Past Surgical History:  Procedure Laterality Date  . CATARACT EXTRACTION W/ INTRAOCULAR LENS  IMPLANT, BILATERAL    . CYSTOSCOPY WITH BIOPSY Left 11/06/2012   Procedure: CYSTOSCOPY WITH BIOPSY;  Surgeon: Alexis Frock, MD;  Location: [facility];  Service: Urology;  Laterality: Left;  . EXTRACORPOREAL SHOCK WAVE LITHOTRIPSY Left 03-06-2011  . HOLMIUM LASER APPLICATION Left 1/32/4401   Procedure: HOLMIUM LASER APPLICATION;  Surgeon: Alexis Frock, MD;  Location: [facility];  Service: Urology;  Laterality: Left;  . INGUINAL HERNIA REPAIR Bilateral 1970   RE-DO LEFT 1985  . LUMBAR DISC SURGERY  09-24-2008   L3 -- L4  . LUMBAR LAMINECTOMY  05/15/2019   with  partial facet joint removal  . MEDIAL COMPARTMENT ARTHROPLASTY, LEFT KNEE  02-22-2010  . REMOVAL BENIGN MASS LEFT LUNG  1977  . VAGOTOMY  1986   ULCERS    There were no vitals filed for this visit.  Subjective Assessment - 06/23/19 1426    Subjective  COVID-19 screening performed upon arrival. Reports doing alright but with ongoing pain in bilateral LEs.    Pertinent History  Lumbar laminectomy L4-L5 05/15/2019, Left TKA, HTN    Limitations  House hold activities;Walking    How long can you stand comfortably?  20 minutes.    How long can you walk comfortably?  Short community distances.    Patient Stated Goals  improve mobility and return to exercise routine at the [facility]    Currently in Pain?  Yes   did not provide number on pain scale        [facility] PT Assessment - 06/23/19 0001      Assessment   Medical Diagnosis  Disc Degeneration, lumbar    Referring Provider (PT)  Milas Kocher, NP    Onset Date/Surgical Date  05/15/19    Next MD Visit  07/05/2019    Prior Therapy  yes      Precautions   Precautions  Fall;Back    Precaution Comments  No bending, twisting, and  liftinger greater than 15 lb                   [facility] Adult PT Treatment/Exercise - 06/23/19 0001      Exercises   Exercises  Lumbar      Lumbar Exercises: Aerobic   Nustep  Level 3 x10 mins with draw in      Lumbar Exercises: Supine   Ab Set  20 reps;2 seconds    Clam  20 reps;2 seconds    Clam Limitations  yellow theraband    Bent Knee Raise  20 reps;2 seconds    Other Supine Lumbar Exercises  ball squeeze 3" hold x20      Modalities   Modalities  Electrical Stimulation;Moist Heat;Ultrasound      Moist Heat Therapy   Number Minutes Moist Heat  10 Minutes    Moist Heat Location  Lumbar Spine      Electrical Stimulation   Electrical Stimulation Location  lower lumbar    Electrical Stimulation Action  IFC    Electrical Stimulation Parameters  80-150 hz x10 mins    Electrical  Stimulation Goals  Pain               PT Short Term Goals - 03/05/19 1444      PT SHORT TERM GOAL #1   Title  STG's=LTG's.        PT Long Term Goals - 06/18/19 1433      PT LONG TERM GOAL #1   Title  Patient will be independent with HEP    Time  6    Period  Weeks    Status  New      PT LONG TERM GOAL #2   Title  Patient will report ability to perform ADLs and lower body dressing with low back pain less than or equal to 2/10.    Time  6    Status  New      PT LONG TERM GOAL #3   Title  Patient will demonstrate proper bed mobility and log rolling technique to protect spine during transitional movements.    Time  6    Period  Weeks    Status  New            Plan - 06/23/19 1416    Clinical Impression Statement  Patient arrived late to therapy appointment therefore session was shortened. Patient responded fairly well but did report a slight increase of pain in bilateral knees with nustep and reported an ongoing history of bilateral knee pain. Patient guided through supine core strengthening with no increase reports of pain. Patient and PT discussed nerves take time to heal. Patient reported understanding. No adverse affects upon removal of modalities.    Personal Factors and Comorbidities  Comorbidity 1    Comorbidities  lumbar laminectomy L4-L5 05/15/2019; left TKA, HTN    Examination-Activity Limitations  Other;Stand    Examination-Participation Restrictions  Other    Stability/Clinical Decision Making  Evolving/Moderate complexity    Clinical Decision Making  Low    Rehab Potential  Good    PT Frequency  2x / week    PT Duration  6 weeks    PT Treatment/Interventions  ADLs/Self Care Home Management;Cryotherapy;Electrical Stimulation;Ultrasound;Traction;Moist Heat;Therapeutic activities;Therapeutic exercise;Manual techniques;Patient/family education;Passive range of motion;Dry needling;Spinal Manipulations    PT Next Visit Plan  Nustep, core strengthening and  stability, LE strengthening, modalities PRN for pain relief.    PT Home Exercise Plan  see patient education section  Consulted and Agree with Plan of Care  Patient       Patient will benefit from skilled therapeutic intervention in order to improve the following deficits and impairments:  Pain, Decreased mobility, Decreased activity tolerance, Decreased range of motion, Postural dysfunction  Visit Diagnosis: Chronic bilateral low back pain with left-sided sciatica  Muscle weakness (generalized)  Chronic bilateral low back pain without sciatica     Problem List Patient Active Problem List   Diagnosis Date Noted  . MDD (major depressive disorder) 09/18/2013  . Opiate dependence (HCC) 09/18/2013    Guss Bunde, PT, DPT 06/23/2019, 2:49 PM  [facility] [address] [place], [place], [address] Phone: [phone]   Fax:  [phone]  Name: Lum Stillinger MRN: [id number] Date of Birth: April 16, 1941

## 2019-06-26 ENCOUNTER — Encounter: Payer: Medicare Other | Admitting: *Deleted

## 2019-07-01 ENCOUNTER — Ambulatory Visit: Payer: Medicare Other | Admitting: *Deleted

## 2019-07-01 ENCOUNTER — Other Ambulatory Visit: Payer: Self-pay

## 2019-07-01 DIAGNOSIS — M5442 Lumbago with sciatica, left side: Secondary | ICD-10-CM

## 2019-07-01 DIAGNOSIS — M6281 Muscle weakness (generalized): Secondary | ICD-10-CM

## 2019-07-01 DIAGNOSIS — G8929 Other chronic pain: Secondary | ICD-10-CM

## 2019-07-01 NOTE — Therapy (Signed)
[facility] [address] [place], [place], [address] Phone: [phone]   Fax:  [phone]  Physical Therapy Treatment  Patient Details  Name: David Carroll MRN: [id number] Date of Birth: [birth year] Referring Provider (PT): Rueben Bash, NP   Encounter Date: 07/01/2019  PT End of Session - 07/01/19 1113    Visit Number  3    Number of Visits  12    Date for PT Re-Evaluation  08/06/19    Authorization Type  FOTO; PROGRESS NOTE AT 10TH VISIT.  KX MODIFIER AFTER 15 VISITS.    PT Start Time  1115    PT Stop Time  1205    PT Time Calculation (min)  50 min    Activity Tolerance  Patient tolerated treatment well    Behavior During Therapy  WFL for tasks assessed/performed       Past Medical History:  Diagnosis Date  . Arthritis    KNEES  . BPH (benign prostatic hypertrophy)   . Complication of anesthesia SLOW RECOVERING FROM ANES. DRUGS   HX OPIATE ADDICTION-- PT ON SUBOXONE  . Depression   . Glaucoma   . History of gastric ulcer   . History of gastroesophageal reflux (GERD)   . History of kidney stones   . Hydronephrosis, left   . Hypertension   . Nephrolithiasis   . Opiate addiction ([facility])   . Urgency of urination     Past Surgical History:  Procedure Laterality Date  . CATARACT EXTRACTION W/ INTRAOCULAR LENS  IMPLANT, BILATERAL    . CYSTOSCOPY WITH BIOPSY Left 11/06/2012   Procedure: CYSTOSCOPY WITH BIOPSY;  Surgeon: Sebastian Ache, MD;  Location: [facility];  Service: Urology;  Laterality: Left;  . EXTRACORPOREAL SHOCK WAVE LITHOTRIPSY Left 03-06-2011  . HOLMIUM LASER APPLICATION Left 11/06/2012   Procedure: HOLMIUM LASER APPLICATION;  Surgeon: Sebastian Ache, MD;  Location: [facility];  Service: Urology;  Laterality: Left;  . INGUINAL HERNIA REPAIR Bilateral 1970   RE-DO LEFT 1985  . LUMBAR DISC SURGERY  09-24-2008   L3 -- L4  . LUMBAR LAMINECTOMY  05/15/2019   with partial facet  joint removal  . MEDIAL COMPARTMENT ARTHROPLASTY, LEFT KNEE  02-22-2010  . REMOVAL BENIGN MASS LEFT LUNG  1977  . VAGOTOMY  1986   ULCERS    There were no vitals filed for this visit.  Subjective Assessment - 07/01/19 1113    Subjective  COVID-19 screening performed upon arrival. Reports doing alright but with ongoing pain in bilateral LEs.    Pertinent History  Lumbar laminectomy L4-L5 05/15/2019, Left TKA, HTN    Limitations  House hold activities;Walking    How long can you walk comfortably?  Short community distances.    Patient Stated Goals  improve mobility and return to exercise routine at the [facility] Adult PT Treatment/Exercise - 07/01/19 0001      Exercises   Exercises  Lumbar      Lumbar Exercises: Aerobic   Nustep  Level 3 x10 mins with draw in      Lumbar Exercises: Supine   Bent Knee Raise  20 reps;2 seconds      Modalities   Modalities  Electrical Stimulation;Moist Heat;Ultrasound      Moist Heat Therapy   Number Minutes Moist Heat  15 Minutes    Moist Heat Location  Lumbar  Spine      Acupuncturist Location  lower lumbar    Electrical Stimulation Action  IFC    Electrical Stimulation Parameters  80-150hz  x 15 mins    Electrical Stimulation Goals  Pain      Manual Therapy   Manual Therapy  Soft tissue mobilization;Passive ROM    Soft tissue mobilization  STW/M to bilateral lumbar paraspinals     Passive ROM  AAROM for Bil LEs for hip and knee flexion extension to help with motor control                  PT Long Term Goals - 06/18/19 1433      PT LONG TERM GOAL #1   Title  Patient will be independent with HEP    Time  6    Period  Weeks    Status  New      PT LONG TERM GOAL #2   Title  Patient will report ability to perform ADLs and lower body dressing with low back pain less than or equal to 2/10.    Time  6    Status  New      PT LONG TERM GOAL #3   Title   Patient will demonstrate proper bed mobility and log rolling technique to protect spine during transitional movements.    Time  6    Period  Weeks    Status  New            Plan - 07/01/19 1114    Clinical Impression Statement  Pt arrived today doing about the same with LB soreness, but did well after last Rx. Pt's CC today was that he is having a hard time rais. Manual STW performed to LB paras as well as AAROM for Both LEs ing both of his legs when trying to put his shoe and socks on. Pt was guided through therex for LE strengthening and core activation. Manual STW was performed to LB paras and AAROM to both LEs while supine for hip and knee flexion compound movements..Normal modality response today    Comorbidities  lumbar laminectomy L4-L5 05/15/2019; left TKA, HTN    Examination-Activity Limitations  Other;Stand    Examination-Participation Restrictions  Other    Rehab Potential  Good    PT Frequency  2x / week    PT Duration  6 weeks    PT Treatment/Interventions  ADLs/Self Care Home Management;Cryotherapy;Electrical Stimulation;Ultrasound;Traction;Moist Heat;Therapeutic activities;Therapeutic exercise;Manual techniques;Patient/family education;Passive range of motion;Dry needling;Spinal Manipulations    PT Next Visit Plan  Nustep, core strengthening and stability, LE strengthening, modalities PRN for pain relief.    PT Home Exercise Plan  see patient education section    Consulted and Agree with Plan of Care  Patient       Patient will benefit from skilled therapeutic intervention in order to improve the following deficits and impairments:  Pain, Decreased mobility, Decreased activity tolerance, Decreased range of motion, Postural dysfunction  Visit Diagnosis: Chronic bilateral low back pain with left-sided sciatica  Muscle weakness (generalized)  Chronic bilateral low back pain without sciatica     Problem List Patient Active Problem List   Diagnosis Date Noted  . MDD  (major depressive disorder) 09/18/2013  . Opiate dependence ([place]) 09/18/2013    Antwian Santaana,CHRIS, PTA 07/01/2019, 6:01 PM  [facility] [place], [place], [address] Phone: [phone]   Fax:  [phone]  Name: Klaus Casteneda MRN: [id number] Date  of Birth: [birth year]

## 2019-07-07 ENCOUNTER — Ambulatory Visit: Payer: Medicare Other | Admitting: Physical Therapy

## 2019-07-07 ENCOUNTER — Other Ambulatory Visit: Payer: Self-pay

## 2019-07-07 ENCOUNTER — Encounter: Payer: Self-pay | Admitting: Physical Therapy

## 2019-07-07 DIAGNOSIS — M545 Low back pain, unspecified: Secondary | ICD-10-CM

## 2019-07-07 DIAGNOSIS — G8929 Other chronic pain: Secondary | ICD-10-CM

## 2019-07-07 DIAGNOSIS — M6281 Muscle weakness (generalized): Secondary | ICD-10-CM

## 2019-07-07 DIAGNOSIS — M5442 Lumbago with sciatica, left side: Secondary | ICD-10-CM | POA: Diagnosis not present

## 2019-07-07 NOTE — Therapy (Signed)
[facility] [place], [place], [address] Phone: [phone]   Fax:  [phone]  Physical Therapy Treatment  Patient Details  Name: David Carroll MRN: [id number] Date of Birth: Mar 08, 1941 Referring Provider (PT): Milas Kocher, NP   Encounter Date: 07/07/2019  PT End of Session - 07/07/19 1150    Visit Number  4    Number of Visits  12    Date for PT Re-Evaluation  08/06/19    Authorization Type  FOTO; PROGRESS NOTE AT 10TH VISIT.  KX MODIFIER AFTER 15 VISITS.    PT Start Time  1115    PT Stop Time  1201    PT Time Calculation (min)  46 min    Activity Tolerance  Patient tolerated treatment well    Behavior During Therapy  WFL for tasks assessed/performed       Past Medical History:  Diagnosis Date  . Arthritis    KNEES  . BPH (benign prostatic hypertrophy)   . Complication of anesthesia SLOW RECOVERING FROM ANES. DRUGS   HX OPIATE ADDICTION-- PT ON SUBOXONE  . Depression   . Glaucoma   . History of gastric ulcer   . History of gastroesophageal reflux (GERD)   . History of kidney stones   . Hydronephrosis, left   . Hypertension   . Nephrolithiasis   . Opiate addiction ([place])   . Urgency of urination     Past Surgical History:  Procedure Laterality Date  . CATARACT EXTRACTION W/ INTRAOCULAR LENS  IMPLANT, BILATERAL    . CYSTOSCOPY WITH BIOPSY Left 11/06/2012   Procedure: CYSTOSCOPY WITH BIOPSY;  Surgeon: Alexis Frock, MD;  Location: [facility];  Service: Urology;  Laterality: Left;  . EXTRACORPOREAL SHOCK WAVE LITHOTRIPSY Left 03-06-2011  . HOLMIUM LASER APPLICATION Left 3/81/0175   Procedure: HOLMIUM LASER APPLICATION;  Surgeon: Alexis Frock, MD;  Location: [facility];  Service: Urology;  Laterality: Left;  . INGUINAL HERNIA REPAIR Bilateral 1970   RE-DO LEFT 1985  . LUMBAR DISC SURGERY  09-24-2008   L3 -- L4  . LUMBAR LAMINECTOMY  05/15/2019   with partial facet  joint removal  . MEDIAL COMPARTMENT ARTHROPLASTY, LEFT KNEE  02-22-2010  . REMOVAL BENIGN MASS LEFT LUNG  1977  . VAGOTOMY  1986   ULCERS    There were no vitals filed for this visit.  Subjective Assessment - 07/07/19 1119    Subjective  COVID-19 screening performed upon arrival. Reports some ongoing back pain    Pertinent History  Lumbar laminectomy L4-L5 05/15/2019, Left TKA, HTN    Limitations  House hold activities;Walking    How long can you stand comfortably?  20 minutes.    How long can you walk comfortably?  Short community distances.    Patient Stated Goals  improve mobility and return to exercise routine at the [facility]    Currently in Pain?  Yes    Pain Score  4     Pain Location  Back    Pain Orientation  Left;Right   left a little more   Pain Descriptors / Indicators  Discomfort    Pain Type  Chronic pain    Pain Onset  More than a month ago    Pain Frequency  Intermittent    Aggravating Factors   prolong walking, bending and lifting                       [facility] Adult PT  Treatment/Exercise - 07/07/19 0001      Self-Care   Self-Care  ADL's;Posture    ADL's  standing washing dishes brushing teeth, weight shifting, bending    Posture  bending, lifting and all positions, log roll      Lumbar Exercises: Aerobic   Nustep  Level 3 x10 mins with draw in      Lumbar Exercises: Standing   Other Standing Lumbar Exercises  ext pull down with green XTS x20      Lumbar Exercises: Supine   Clam  20 reps;2 seconds    Clam Limitations  red theraband    Bent Knee Raise  3 seconds   2x10   Straight Leg Raise  3 seconds   2x10   Other Supine Lumbar Exercises  ball squeeze 3" hold x20      Moist Heat Therapy   Number Minutes Moist Heat  15 Minutes    Moist Heat Location  Lumbar Spine      Electrical Stimulation   Electrical Stimulation Location  lower lumbar    Electrical Stimulation Action  IFC    Electrical Stimulation Parameters  80-150hz  x43min     Electrical Stimulation Goals  Pain                  PT Long Term Goals - 07/07/19 1150      PT LONG TERM GOAL #1   Title  Patient will be independent with HEP    Time  6    Period  Weeks    Status  On-going      PT LONG TERM GOAL #2   Title  Patient will report ability to perform ADLs and lower body dressing with low back pain less than or equal to 2/10.    Time  6    Period  Weeks    Status  On-going      PT LONG TERM GOAL #3   Title  Patient will demonstrate proper bed mobility and log rolling technique to protect spine during transitional movements.    Time  6    Period  Weeks    Status  On-going   educated patient today on log roll yet requires ongoing practice and educational cues for corrent technique 07/07/19           Plan - 07/07/19 1152    Clinical Impression Statement  Patient tolerated treatment well today. Patient was able to progress with core activation exrcises with no increased dicomfort. Educated patient on posture awareness techniques and logroll today, patient had soe difficulty with log roll and will continue to practice at home. Current goals ongoing at this time due to pain limitations.    Personal Factors and Comorbidities  Comorbidity 1    Comorbidities  lumbar laminectomy L4-L5 05/15/2019; left TKA, HTN    Examination-Activity Limitations  Other;Stand    Examination-Participation Restrictions  Other    Stability/Clinical Decision Making  Evolving/Moderate complexity    Rehab Potential  Good    PT Frequency  2x / week    PT Duration  6 weeks    PT Treatment/Interventions  ADLs/Self Care Home Management;Cryotherapy;Electrical Stimulation;Ultrasound;Traction;Moist Heat;Therapeutic activities;Therapeutic exercise;Manual techniques;Patient/family education;Passive range of motion;Dry needling;Spinal Manipulations    PT Next Visit Plan  cont with POC for  core strengthening and stability, LE strengthening, modalities PRN for pain relief.     Consulted and Agree with Plan of Care  Patient       Patient will benefit from skilled therapeutic intervention  in order to improve the following deficits and impairments:  Pain, Decreased mobility, Decreased activity tolerance, Decreased range of motion, Postural dysfunction  Visit Diagnosis: Chronic bilateral low back pain with left-sided sciatica  Muscle weakness (generalized)  Chronic bilateral low back pain without sciatica     Problem List Patient Active Problem List   Diagnosis Date Noted  . MDD (major depressive disorder) 09/18/2013  . Opiate dependence (HCC) 09/18/2013    David Carroll P, PTA 07/07/2019, 12:06 PM  [facility] [address] [place], [place], [address] Phone: [phone]   Fax:  [phone]  Name: David Carroll MRN: [id number] Date of Birth: 09/17/1940

## 2019-07-10 ENCOUNTER — Other Ambulatory Visit: Payer: Self-pay

## 2019-07-10 ENCOUNTER — Ambulatory Visit: Payer: Medicare Other | Admitting: *Deleted

## 2019-07-10 DIAGNOSIS — M5442 Lumbago with sciatica, left side: Secondary | ICD-10-CM | POA: Diagnosis not present

## 2019-07-10 DIAGNOSIS — G8929 Other chronic pain: Secondary | ICD-10-CM

## 2019-07-10 DIAGNOSIS — M6281 Muscle weakness (generalized): Secondary | ICD-10-CM

## 2019-07-10 NOTE — Therapy (Signed)
[facility] [place], [place], [address] Phone: [phone]   Fax:  [phone]  Physical Therapy Treatment  Patient Details  Name: David Carroll MRN: [id number] Date of Birth: 1941-02-16 Referring Provider (PT): Milas Kocher, NP   Encounter Date: 07/10/2019  PT End of Session - 07/10/19 1351    Visit Number  5    Number of Visits  12    Date for PT Re-Evaluation  08/06/19    Authorization Type  FOTO; PROGRESS NOTE AT 10TH VISIT.  KX MODIFIER AFTER 15 VISITS.    PT Start Time  1300    PT Stop Time  1351    PT Time Calculation (min)  51 min       Past Medical History:  Diagnosis Date  . Arthritis    KNEES  . BPH (benign prostatic hypertrophy)   . Complication of anesthesia SLOW RECOVERING FROM ANES. DRUGS   HX OPIATE ADDICTION-- PT ON SUBOXONE  . Depression   . Glaucoma   . History of gastric ulcer   . History of gastroesophageal reflux (GERD)   . History of kidney stones   . Hydronephrosis, left   . Hypertension   . Nephrolithiasis   . Opiate addiction ([place])   . Urgency of urination     Past Surgical History:  Procedure Laterality Date  . CATARACT EXTRACTION W/ INTRAOCULAR LENS  IMPLANT, BILATERAL    . CYSTOSCOPY WITH BIOPSY Left 11/06/2012   Procedure: CYSTOSCOPY WITH BIOPSY;  Surgeon: Alexis Frock, MD;  Location: [facility];  Service: Urology;  Laterality: Left;  . EXTRACORPOREAL SHOCK WAVE LITHOTRIPSY Left 03-06-2011  . HOLMIUM LASER APPLICATION Left 7/32/2025   Procedure: HOLMIUM LASER APPLICATION;  Surgeon: Alexis Frock, MD;  Location: [facility];  Service: Urology;  Laterality: Left;  . INGUINAL HERNIA REPAIR Bilateral 1970   RE-DO LEFT 1985  . LUMBAR DISC SURGERY  09-24-2008   L3 -- L4  . LUMBAR LAMINECTOMY  05/15/2019   with partial facet joint removal  . MEDIAL COMPARTMENT ARTHROPLASTY, LEFT KNEE  02-22-2010  . REMOVAL BENIGN MASS LEFT LUNG  1977  .  VAGOTOMY  1986   ULCERS    There were no vitals filed for this visit.  Subjective Assessment - 07/10/19 1313    Subjective  COVID-19 screening performed upon arrival. Reports some ongoing back pain 4/10    Pertinent History  Lumbar laminectomy L4-L5 05/15/2019, Left TKA, HTN    Limitations  House hold activities;Walking                       [facility] Adult PT Treatment/Exercise - 07/10/19 0001      Lumbar Exercises: Aerobic   Nustep  Level 3 x10 mins with draw in      Lumbar Exercises: Seated   Other Seated Lumbar Exercises  green tband ROWS 3x10      Lumbar Exercises: Supine   Clam  20 reps;2 seconds;10 reps    Clam Limitations  green theraband    Bent Knee Raise  --   2x10   Straight Leg Raise  3 seconds   2x10   Other Supine Lumbar Exercises  ball squeeze 3" hold x30      Modalities   Modalities  Electrical Stimulation;Moist Heat;Ultrasound      Moist Heat Therapy   Number Minutes Moist Heat  15 Minutes    Moist Heat Location  Lumbar Spine  Cytogeneticist  IFC    Electrical Stimulation Parameters  80-150hz  x 15 mins    Electrical Stimulation Goals  Pain                  PT Long Term Goals - 07/07/19 1150      PT LONG TERM GOAL #1   Title  Patient will be independent with HEP    Time  6    Period  Weeks    Status  On-going      PT LONG TERM GOAL #2   Title  Patient will report ability to perform ADLs and lower body dressing with low back pain less than or equal to 2/10.    Time  6    Period  Weeks    Status  On-going      PT LONG TERM GOAL #3   Title  Patient will demonstrate proper bed mobility and log rolling technique to protect spine during transitional movements.    Time  6    Period  Weeks    Status  On-going   educated patient today on log roll yet requires ongoing practice and educational cues for corrent technique 07/07/19            Plan - 07/10/19 1352    Clinical Impression Statement  pt arrived today doing fairly well. He was was guided through core and postural exs today and did very well, but continues to need verbal and tactile cues for technique. Normal modality response.       Patient will benefit from skilled therapeutic intervention in order to improve the following deficits and impairments:     Visit Diagnosis: Chronic bilateral low back pain with left-sided sciatica  Muscle weakness (generalized)  Chronic bilateral low back pain without sciatica     Problem List Patient Active Problem List   Diagnosis Date Noted  . MDD (major depressive disorder) 09/18/2013  . Opiate dependence (HCC) 09/18/2013    Khaleesi Gruel,CHRIS, PTA 07/10/2019, 2:01 PM  [facility] [address] [place], [place], [address] Phone: [phone]   Fax:  [phone]  Name: Raequon Catanzaro MRN: [id number] Date of Birth: 1941/02/10

## 2019-07-14 ENCOUNTER — Ambulatory Visit: Payer: Medicare Other | Attending: Nurse Practitioner | Admitting: Physical Therapy

## 2019-07-14 ENCOUNTER — Other Ambulatory Visit: Payer: Self-pay

## 2019-07-14 DIAGNOSIS — G8929 Other chronic pain: Secondary | ICD-10-CM

## 2019-07-14 DIAGNOSIS — M5442 Lumbago with sciatica, left side: Secondary | ICD-10-CM | POA: Diagnosis present

## 2019-07-14 DIAGNOSIS — M545 Low back pain: Secondary | ICD-10-CM | POA: Diagnosis present

## 2019-07-14 DIAGNOSIS — M6281 Muscle weakness (generalized): Secondary | ICD-10-CM | POA: Diagnosis present

## 2019-07-14 NOTE — Therapy (Signed)
[facility] [place], [place], [address] Phone: [phone]   Fax:  [phone]  Physical Therapy Treatment  Patient Details  Name: David Carroll MRN: [id number] Date of Birth: 06-05-1941 Referring Provider (PT): Milas Kocher, NP   Encounter Date: 07/14/2019  PT End of Session - 07/14/19 1401    Visit Number  6    Number of Visits  12    Date for PT Re-Evaluation  08/06/19    Authorization Type  FOTO; PROGRESS NOTE AT 10TH VISIT.  KX MODIFIER AFTER 15 VISITS.    PT Start Time  0100    PT Stop Time  0151    PT Time Calculation (min)  51 min    Activity Tolerance  Patient tolerated treatment well    Behavior During Therapy  WFL for tasks assessed/performed       Past Medical History:  Diagnosis Date  . Arthritis    KNEES  . BPH (benign prostatic hypertrophy)   . Complication of anesthesia SLOW RECOVERING FROM ANES. DRUGS   HX OPIATE ADDICTION-- PT ON SUBOXONE  . Depression   . Glaucoma   . History of gastric ulcer   . History of gastroesophageal reflux (GERD)   . History of kidney stones   . Hydronephrosis, left   . Hypertension   . Nephrolithiasis   . Opiate addiction ([place])   . Urgency of urination     Past Surgical History:  Procedure Laterality Date  . CATARACT EXTRACTION W/ INTRAOCULAR LENS  IMPLANT, BILATERAL    . CYSTOSCOPY WITH BIOPSY Left 11/06/2012   Procedure: CYSTOSCOPY WITH BIOPSY;  Surgeon: Alexis Frock, MD;  Location: [facility];  Service: Urology;  Laterality: Left;  . EXTRACORPOREAL SHOCK WAVE LITHOTRIPSY Left 03-06-2011  . HOLMIUM LASER APPLICATION Left 5/63/1497   Procedure: HOLMIUM LASER APPLICATION;  Surgeon: Alexis Frock, MD;  Location: [facility];  Service: Urology;  Laterality: Left;  . INGUINAL HERNIA REPAIR Bilateral 1970   RE-DO LEFT 1985  . LUMBAR DISC SURGERY  09-24-2008   L3 -- L4  . LUMBAR LAMINECTOMY  05/15/2019   with partial facet  joint removal  . MEDIAL COMPARTMENT ARTHROPLASTY, LEFT KNEE  02-22-2010  . REMOVAL BENIGN MASS LEFT LUNG  1977  . VAGOTOMY  1986   ULCERS    There were no vitals filed for this visit.  Subjective Assessment - 07/14/19 1354    Subjective  COVID-19 screen performed prior to patient entering clinic.  My wife can tell I'm walking better.  Would like to be able to put on my shoes easier.    Pertinent History  Lumbar laminectomy L4-L5 05/15/2019, Left TKA, HTN    Limitations  House hold activities;Walking    How long can you stand comfortably?  20 minutes.    How long can you walk comfortably?  Short community distances.    Patient Stated Goals  improve mobility and return to exercise routine at the [facility]    Pain Score  4     Pain Location  Back    Pain Orientation  Right;Left    Pain Descriptors / Indicators  Discomfort    Pain Type  Chronic pain    Pain Onset  More than a month ago                       [name] Adult PT Treatment/Exercise - 07/14/19 0001      Exercises   Exercises  Knee/Hip      Lumbar Exercises: Aerobic   Nustep  level 3 x 15 minutes.      Modalities   Modalities  Electrical Stimulation;Moist Heat      Moist Heat Therapy   Number Minutes Moist Heat  20 Minutes    Moist Heat Location  Lumbar Spine      Electrical Stimulation   Electrical Stimulation Location  Lower lumbar.    Electrical Stimulation Action  IFC    Electrical Stimulation Parameters  80-150 Hz x 20 minutes.    Electrical Stimulation Goals  Pain      Manual Therapy   Manual Therapy  Passive ROM    Passive ROM  In supine:  1 to 1 bilateral manual hip stretching with long holds (4 minutes each hip).  8 minutes total.                  PT Long Term Goals - 07/07/19 1150      PT LONG TERM GOAL #1   Title  Patient will be independent with HEP    Time  6    Period  Weeks    Status  On-going      PT LONG TERM GOAL #2   Title  Patient will report ability to perform  ADLs and lower body dressing with low back pain less than or equal to 2/10.    Time  6    Period  Weeks    Status  On-going      PT LONG TERM GOAL #3   Title  Patient will demonstrate proper bed mobility and log rolling technique to protect spine during transitional movements.    Time  6    Period  Weeks    Status  On-going   educated patient today on log roll yet requires ongoing practice and educational cues for corrent technique 07/07/19           Plan - 07/14/19 1358    Clinical Impression Statement  Patient pleased with his progress.  He is walking better.  Wants to be able to put on shoes and tie laces easier.  Performed bilateral hip stretching today to help him accomplish this.    Personal Factors and Comorbidities  Comorbidity 1    Comorbidities  lumbar laminectomy L4-L5 05/15/2019; left TKA, HTN    Examination-Activity Limitations  Other;Stand    Examination-Participation Restrictions  Other    Stability/Clinical Decision Making  Evolving/Moderate complexity    Rehab Potential  Good    PT Frequency  2x / week    PT Duration  6 weeks    PT Treatment/Interventions  ADLs/Self Care Home Management;Cryotherapy;Electrical Stimulation;Ultrasound;Traction;Moist Heat;Therapeutic activities;Therapeutic exercise;Manual techniques;Patient/family education;Passive range of motion;Dry needling;Spinal Manipulations    PT Next Visit Plan  cont with POC for  core strengthening and stability, LE strengthening, modalities PRN for pain relief.    PT Home Exercise Plan  see patient education section    Consulted and Agree with Plan of Care  Patient       Patient will benefit from skilled therapeutic intervention in order to improve the following deficits and impairments:  Pain, Decreased mobility, Decreased activity tolerance, Decreased range of motion, Postural dysfunction  Visit Diagnosis: Chronic bilateral low back pain with left-sided sciatica  Muscle weakness (generalized)  Chronic  bilateral low back pain without sciatica     Problem List Patient Active Problem List   Diagnosis Date Noted  . MDD (major depressive disorder) 09/18/2013  .  Opiate dependence (HCC) 09/18/2013    David Carroll, [place] MPT 07/14/2019, 2:02 PM  [facility] [address] [place], [place], [address] Phone: [phone]   Fax:  [phone]  Name: Jigar Zielke MRN: [id number] Date of Birth: Jul 11, 1940

## 2019-07-17 ENCOUNTER — Ambulatory Visit: Payer: Medicare Other | Admitting: Physical Therapy

## 2019-07-17 ENCOUNTER — Other Ambulatory Visit: Payer: Self-pay

## 2019-07-17 ENCOUNTER — Encounter: Payer: Self-pay | Admitting: Physical Therapy

## 2019-07-17 DIAGNOSIS — M545 Low back pain, unspecified: Secondary | ICD-10-CM

## 2019-07-17 DIAGNOSIS — M6281 Muscle weakness (generalized): Secondary | ICD-10-CM

## 2019-07-17 DIAGNOSIS — G8929 Other chronic pain: Secondary | ICD-10-CM

## 2019-07-17 DIAGNOSIS — M5442 Lumbago with sciatica, left side: Secondary | ICD-10-CM | POA: Diagnosis not present

## 2019-07-17 NOTE — Therapy (Signed)
[facility] [address] [place], [place], [address] Phone: [phone]   Fax:  [phone]  Physical Therapy Treatment  Patient Details  Name: David Carroll MRN: [id number] Date of Birth: 1940-10-11 Referring Provider (PT): Rueben Bash, NP   Encounter Date: 07/17/2019  PT End of Session - 07/17/19 1339    Visit Number  7    Number of Visits  12    Date for PT Re-Evaluation  08/06/19    Authorization Type  FOTO; PROGRESS NOTE AT 10TH VISIT.  KX MODIFIER AFTER 15 VISITS.    PT Start Time  0100    PT Stop Time  0151    PT Time Calculation (min)  51 min    Activity Tolerance  Patient tolerated treatment well    Behavior During Therapy  WFL for tasks assessed/performed       Past Medical History:  Diagnosis Date  . Arthritis    KNEES  . BPH (benign prostatic hypertrophy)   . Complication of anesthesia SLOW RECOVERING FROM ANES. DRUGS   HX OPIATE ADDICTION-- PT ON SUBOXONE  . Depression   . Glaucoma   . History of gastric ulcer   . History of gastroesophageal reflux (GERD)   . History of kidney stones   . Hydronephrosis, left   . Hypertension   . Nephrolithiasis   . Opiate addiction (HCC)   . Urgency of urination     Past Surgical History:  Procedure Laterality Date  . CATARACT EXTRACTION W/ INTRAOCULAR LENS  IMPLANT, BILATERAL    . CYSTOSCOPY WITH BIOPSY Left 11/06/2012   Procedure: CYSTOSCOPY WITH BIOPSY;  Surgeon: Sebastian Ache, MD;  Location: [facility];  Service: Urology;  Laterality: Left;  . EXTRACORPOREAL SHOCK WAVE LITHOTRIPSY Left 03-06-2011  . HOLMIUM LASER APPLICATION Left 11/06/2012   Procedure: HOLMIUM LASER APPLICATION;  Surgeon: Sebastian Ache, MD;  Location: [facility];  Service: Urology;  Laterality: Left;  . INGUINAL HERNIA REPAIR Bilateral 1970   RE-DO LEFT 1985  . LUMBAR DISC SURGERY  09-24-2008   L3 -- L4  . LUMBAR LAMINECTOMY  05/15/2019   with partial facet  joint removal  . MEDIAL COMPARTMENT ARTHROPLASTY, LEFT KNEE  02-22-2010  . REMOVAL BENIGN MASS LEFT LUNG  1977  . VAGOTOMY  1986   ULCERS    There were no vitals filed for this visit.  Subjective Assessment - 07/17/19 1302    Subjective  COVID-19 screen performed prior to patient entering clinic.  Patient arrived with improvement and was able to tolerate cooking dinner last night.    Pertinent History  Lumbar laminectomy L4-L5 05/15/2019, Left TKA, HTN    Limitations  House hold activities;Walking    How long can you stand comfortably?  20 minutes.    How long can you walk comfortably?  Short community distances.    Patient Stated Goals  improve mobility and return to exercise routine at the [facility]    Currently in Pain?  Yes    Pain Score  4     Pain Location  Back    Pain Orientation  Right;Left    Pain Descriptors / Indicators  Discomfort    Pain Type  Chronic pain    Pain Onset  More than a month ago    Pain Frequency  Intermittent    Aggravating Factors   prolong standing or walking    Pain Relieving Factors  at rest/ sitting  [facility] Adult PT Treatment/Exercise - 07/17/19 0001      Self-Care   Self-Care  Other Self-Care Comments    Other Self-Care Comments   logroll x2reps      Lumbar Exercises: Stretches   Other Lumbar Stretch Exercise  hip stretch 20sec 2reps each LE      Lumbar Exercises: Aerobic   Nustep  level 3 x 15 minutes.      Lumbar Exercises: Standing   Row  Strengthening;20 reps;Theraband    Theraband Level (Row)  Other (comment)    Row Limitations  blue XTS    Shoulder Extension  Strengthening;20 reps;Theraband    Theraband Level (Shoulder Extension)  Other (comment)    Shoulder Extension Limitations  blue XTS      Lumbar Exercises: Supine   Clam  20 reps;2 seconds;10 reps    Clam Limitations  green theraband    Straight Leg Raise  3 seconds   2x10   Other Supine Lumbar Exercises  ball squeeze 3" hold x30       Moist Heat Therapy   Number Minutes Moist Heat  15 Minutes    Moist Heat Location  Lumbar Spine      Electrical Stimulation   Electrical Stimulation Location  Lower lumbar.    Electrical Stimulation Action  IFC    Electrical Stimulation Parameters  80-150hz  x42min    Electrical Stimulation Goals  Pain                  PT Long Term Goals - 07/17/19 1330      PT LONG TERM GOAL #1   Title  Patient will be independent with HEP    Time  6    Period  Weeks    Status  On-going   only walking as exercise at this time 07/17/19     PT LONG TERM GOAL #2   Title  Patient will report ability to perform ADLs and lower body dressing with low back pain less than or equal to 2/10.    Time  6    Period  Weeks    Status  On-going   over 3/10 pain with ADL's 07/17/19     PT LONG TERM GOAL #3   Title  Patient will demonstrate proper bed mobility and log rolling technique to protect spine during transitional movements.    Time  6    Period  Weeks    Status  On-going   not able to perform proper technique 07/17/19           Plan - 07/17/19 1341    Clinical Impression Statement  Patient tolerated treatment well today. Patient able to progress with acyivity tolerance on nustep with SPM. Patient progressing with core activation exercises yet requires educational cues for correct technique. Patient continues to have difficulty with proper logroll technique. Patient has reported some improvement with ability to walk dog daily and tolerate cooking dinner last night. Patient reported a fall a few days ago with no injury. Goals ongoing at this time.    Personal Factors and Comorbidities  Comorbidity 1    Comorbidities  lumbar laminectomy L4-L5 05/15/2019; left TKA, HTN    Examination-Activity Limitations  Other;Stand    Examination-Participation Restrictions  Other    Stability/Clinical Decision Making  Evolving/Moderate complexity    Rehab Potential  Good    PT Frequency  2x / week    PT  Duration  6 weeks    PT Treatment/Interventions  ADLs/Self Care Home  Management;Cryotherapy;Electrical Stimulation;Ultrasound;Traction;Moist Heat;Therapeutic activities;Therapeutic exercise;Manual techniques;Patient/family education;Passive range of motion;Dry needling;Spinal Manipulations    PT Next Visit Plan  cont with POC for  core strengthening and stability, LE strengthening, modalities PRN for pain relief.    Consulted and Agree with Plan of Care  Patient       Patient will benefit from skilled therapeutic intervention in order to improve the following deficits and impairments:  Pain, Decreased mobility, Decreased activity tolerance, Decreased range of motion, Postural dysfunction  Visit Diagnosis: Chronic bilateral low back pain with left-sided sciatica  Muscle weakness (generalized)  Chronic bilateral low back pain without sciatica     Problem List Patient Active Problem List   Diagnosis Date Noted  . MDD (major depressive disorder) 09/18/2013  . Opiate dependence ([place]) 09/18/2013    Reinhold Rickey P, PTA 07/17/2019, 1:55 PM  [facility] [address] [place], [place], [address] Phone: [phone]   Fax:  [phone]  Name: Savas Elvin MRN: [id number] Date of Birth: January 31, 1941

## 2019-07-21 ENCOUNTER — Ambulatory Visit: Payer: Medicare Other | Admitting: Physical Therapy

## 2019-07-21 ENCOUNTER — Encounter: Payer: Self-pay | Admitting: Physical Therapy

## 2019-07-21 ENCOUNTER — Other Ambulatory Visit: Payer: Self-pay

## 2019-07-21 DIAGNOSIS — M6281 Muscle weakness (generalized): Secondary | ICD-10-CM

## 2019-07-21 DIAGNOSIS — G8929 Other chronic pain: Secondary | ICD-10-CM

## 2019-07-21 DIAGNOSIS — M5442 Lumbago with sciatica, left side: Secondary | ICD-10-CM | POA: Diagnosis not present

## 2019-07-21 NOTE — Therapy (Signed)
[facility] [address] [place], [place], [address] Phone: [phone]   Fax:  [phone]  Physical Therapy Treatment  Patient Details  Name: David Carroll MRN: [id number] Date of Birth: 20-Jul-1940 Referring Provider (PT): Rueben Bash, NP   Encounter Date: 07/21/2019  PT End of Session - 07/21/19 1338    Visit Number  8    Number of Visits  12    Date for PT Re-Evaluation  08/06/19    Authorization Type  FOTO; PROGRESS NOTE AT 10TH VISIT.  KX MODIFIER AFTER 15 VISITS.    PT Start Time  0100    PT Stop Time  0149    PT Time Calculation (min)  49 min    Activity Tolerance  Patient tolerated treatment well    Behavior During Therapy  WFL for tasks assessed/performed       Past Medical History:  Diagnosis Date  . Arthritis    KNEES  . BPH (benign prostatic hypertrophy)   . Complication of anesthesia SLOW RECOVERING FROM ANES. DRUGS   HX OPIATE ADDICTION-- PT ON SUBOXONE  . Depression   . Glaucoma   . History of gastric ulcer   . History of gastroesophageal reflux (GERD)   . History of kidney stones   . Hydronephrosis, left   . Hypertension   . Nephrolithiasis   . Opiate addiction (HCC)   . Urgency of urination     Past Surgical History:  Procedure Laterality Date  . CATARACT EXTRACTION W/ INTRAOCULAR LENS  IMPLANT, BILATERAL    . CYSTOSCOPY WITH BIOPSY Left 11/06/2012   Procedure: CYSTOSCOPY WITH BIOPSY;  Surgeon: Sebastian Ache, MD;  Location[facility];  Service: Urology;  Laterality: Left;  . EXTRACORPOREAL SHOCK WAVE LITHOTRIPSY Left 03-06-2011  . HOLMIUM LASER APPLICATION Left 11/06/2012   Procedure: HOLMIUM LASER APPLICATION;  Surgeon: Sebastian Ache, MD;  Location: [facility];  Service: Urology;  Laterality: Left;  . INGUINAL HERNIA REPAIR Bilateral 1970   RE-DO LEFT 1985  . LUMBAR DISC SURGERY  09-24-2008   L3 -- L4  . LUMBAR LAMINECTOMY  05/15/2019   with partial facet  joint removal  . MEDIAL COMPARTMENT ARTHROPLASTY, LEFT KNEE  02-22-2010  . REMOVAL BENIGN MASS LEFT LUNG  1977  . VAGOTOMY  1986   ULCERS    There were no vitals filed for this visit.  Subjective Assessment - 07/21/19 1307    Subjective  COVID-19 screen performed prior to patient entering clinic.  Patient reported some pain over weekend due to a lot of standing yet improved today.    Pertinent History  Lumbar laminectomy L4-L5 05/15/2019, Left TKA, HTN    Limitations  House hold activities;Walking    How long can you stand comfortably?  20 minutes.    How long can you walk comfortably?  Short community distances.    Patient Stated Goals  improve mobility and return to exercise routine at the [facility]    Currently in Pain?  Yes    Pain Score  4     Pain Location  Back    Pain Orientation  Right;Left    Pain Descriptors / Indicators  Discomfort    Pain Type  Chronic pain    Pain Onset  More than a month ago    Pain Frequency  Intermittent    Aggravating Factors   prolong standing    Pain Relieving Factors  at rest  [facility] Adult PT Treatment/Exercise - 07/21/19 0001      Lumbar Exercises: Aerobic   Nustep  level 3 x 15 minutes.      Lumbar Exercises: Standing   Row  Strengthening;20 reps;Theraband    Theraband Level (Row)  Other (comment)    Row Limitations  blue XTS    Shoulder Extension  Strengthening;20 reps;Theraband    Theraband Level (Shoulder Extension)  Other (comment)    Shoulder Extension Limitations  blue XTS    Other Standing Lumbar Exercises  2# diagnols 2x10      Lumbar Exercises: Supine   Clam  20 reps;2 seconds;10 reps    Clam Limitations  green theraband    Straight Leg Raise  3 seconds   2x10   Other Supine Lumbar Exercises  ball squeeze 3" hold x30      Moist Heat Therapy   Number Minutes Moist Heat  15 Minutes    Moist Heat Location  Lumbar Spine      Electrical Stimulation   Electrical Stimulation Location  Lower  lumbar.    Electrical Stimulation Action  IFC    Electrical Stimulation Parameters  80-150hz  x28min    Electrical Stimulation Goals  Pain                  PT Long Term Goals - 07/17/19 1330      PT LONG TERM GOAL #1   Title  Patient will be independent with HEP    Time  6    Period  Weeks    Status  On-going   only walking as exercise at this time 07/17/19     PT LONG TERM GOAL #2   Title  Patient will report ability to perform ADLs and lower body dressing with low back pain less than or equal to 2/10.    Time  6    Period  Weeks    Status  On-going   over 3/10 pain with ADL's 07/17/19     PT LONG TERM GOAL #3   Title  Patient will demonstrate proper bed mobility and log rolling technique to protect spine during transitional movements.    Time  6    Period  Weeks    Status  On-going   not able to perform proper technique 07/17/19           Plan - 07/21/19 1338    Clinical Impression Statement  Patient tolerated treatment well today. Patient progressing with core activation exercises with good understanding and form. Patient continues to have pain with prolong standing activity and relief with rest. Patient current goals ongoing due to pain deficts.    Personal Factors and Comorbidities  Comorbidity 1    Comorbidities  lumbar laminectomy L4-L5 05/15/2019; left TKA, HTN    Examination-Activity Limitations  Other;Stand    Stability/Clinical Decision Making  Evolving/Moderate complexity    Rehab Potential  Good    PT Frequency  2x / week    PT Duration  6 weeks    PT Treatment/Interventions  ADLs/Self Care Home Management;Cryotherapy;Electrical Stimulation;Ultrasound;Traction;Moist Heat;Therapeutic activities;Therapeutic exercise;Manual techniques;Patient/family education;Passive range of motion;Dry needling;Spinal Manipulations    PT Next Visit Plan  cont with POC for  core strengthening and stability, LE strengthening, modalities PRN for pain relief.    Consulted and  Agree with Plan of Care  Patient       Patient will benefit from skilled therapeutic intervention in order to improve the following deficits and impairments:  Pain, Decreased mobility,  Decreased activity tolerance, Decreased range of motion, Postural dysfunction  Visit Diagnosis: Chronic bilateral low back pain with left-sided sciatica  Muscle weakness (generalized)  Chronic bilateral low back pain without sciatica     Problem List Patient Active Problem List   Diagnosis Date Noted  . MDD (major depressive disorder) 09/18/2013  . Opiate dependence (HCC) 09/18/2013    Derhonda Eastlick P, PTA 07/21/2019, 1:56 PM  [facility] [address] [place], [place], [address] Phone: [phone]   Fax:  [phone]  Name: David Carroll MRN: [id number] Date of Birth: February 10, 1941

## 2019-07-24 ENCOUNTER — Ambulatory Visit: Payer: Medicare Other | Admitting: Physical Therapy

## 2019-07-24 ENCOUNTER — Other Ambulatory Visit: Payer: Self-pay

## 2019-07-24 DIAGNOSIS — G8929 Other chronic pain: Secondary | ICD-10-CM

## 2019-07-24 DIAGNOSIS — M5442 Lumbago with sciatica, left side: Secondary | ICD-10-CM | POA: Diagnosis not present

## 2019-07-24 DIAGNOSIS — M545 Low back pain, unspecified: Secondary | ICD-10-CM

## 2019-07-24 DIAGNOSIS — M6281 Muscle weakness (generalized): Secondary | ICD-10-CM

## 2019-07-24 NOTE — Therapy (Signed)
[facility] [address] [place], [place], [address] Phone: [phone]   Fax:  [phone]  Physical Therapy Treatment  Patient Details  Name: Coreon Simkins MRN: [id number] Date of Birth: 1941/01/27 Referring Provider (PT): Rueben Bash, NP   Encounter Date: 07/24/2019  PT End of Session - 07/24/19 1334    Visit Number  9    Number of Visits  12    Date for PT Re-Evaluation  08/06/19    Authorization Type  FOTO; PROGRESS NOTE AT 10TH VISIT.  KX MODIFIER AFTER 15 VISITS.    PT Start Time  0102    PT Stop Time  0155    PT Time Calculation (min)  53 min    Activity Tolerance  Patient tolerated treatment well    Behavior During Therapy  WFL for tasks assessed/performed       Past Medical History:  Diagnosis Date  . Arthritis    KNEES  . BPH (benign prostatic hypertrophy)   . Complication of anesthesia SLOW RECOVERING FROM ANES. DRUGS   HX OPIATE ADDICTION-- PT ON SUBOXONE  . Depression   . Glaucoma   . History of gastric ulcer   . History of gastroesophageal reflux (GERD)   . History of kidney stones   . Hydronephrosis, left   . Hypertension   . Nephrolithiasis   . Opiate addiction (HCC)   . Urgency of urination     Past Surgical History:  Procedure Laterality Date  . CATARACT EXTRACTION W/ INTRAOCULAR LENS  IMPLANT, BILATERAL    . CYSTOSCOPY WITH BIOPSY Left 11/06/2012   Procedure: CYSTOSCOPY WITH BIOPSY;  Surgeon: Sebastian Ache, MD;  Location: [facility];  Service: Urology;  Laterality: Left;  . EXTRACORPOREAL SHOCK WAVE LITHOTRIPSY Left 03-06-2011  . HOLMIUM LASER APPLICATION Left 11/06/2012   Procedure: HOLMIUM LASER APPLICATION;  Surgeon: Sebastian Ache, MD;  Location: [facility];  Service: Urology;  Laterality: Left;  . INGUINAL HERNIA REPAIR Bilateral 1970   RE-DO LEFT 1985  . LUMBAR DISC SURGERY  09-24-2008   L3 -- L4  . LUMBAR LAMINECTOMY  05/15/2019   with partial facet  joint removal  . MEDIAL COMPARTMENT ARTHROPLASTY, LEFT KNEE  02-22-2010  . REMOVAL BENIGN MASS LEFT LUNG  1977  . VAGOTOMY  1986   ULCERS    There were no vitals filed for this visit.  Subjective Assessment - 07/24/19 1334    Subjective  COVID-19 screen performed prior to patient entering clinic.  I lifted 3 40# bags yesterday and my back was hurting very badly last night.  Little better today.    Pertinent History  Lumbar laminectomy L4-L5 05/15/2019, Left TKA, HTN    Limitations  House hold activities;Walking    How long can you stand comfortably?  20 minutes.    How long can you walk comfortably?  Short community distances.    Patient Stated Goals  improve mobility and return to exercise routine at the [facility]    Currently in Pain?  Yes    Pain Score  6     Pain Location  Back    Pain Orientation  Right;Left    Pain Descriptors / Indicators  Discomfort    Pain Type  Chronic pain    Pain Onset  More than a month ago                       [facility] Adult PT Treatment/Exercise - 07/24/19 0001  Exercises   Exercises  Knee/Hip      Lumbar Exercises: Aerobic   Nustep  Level 3 x 15 minutes.      Modalities   Modalities  Electrical Stimulation;Moist Heat;Ultrasound      Moist Heat Therapy   Number Minutes Moist Heat  20 Minutes    Moist Heat Location  Lumbar Spine      Electrical Stimulation   Electrical Stimulation Location  Lower lumbar.    Electrical Stimulation Action  IFC    Electrical Stimulation Parameters  80-150 Hz x 20 minutes.    Electrical Stimulation Goals  Pain      Ultrasound   Ultrasound Location  Low back.    Ultrasound Parameters  U/S at 1.50 W/CM2 x 8 minutes.    Ultrasound Goals  Pain                  PT Long Term Goals - 07/17/19 1330      PT LONG TERM GOAL #1   Title  Patient will be independent with HEP    Time  6    Period  Weeks    Status  On-going   only walking as exercise at this time 07/17/19     PT LONG TERM  GOAL #2   Title  Patient will report ability to perform ADLs and lower body dressing with low back pain less than or equal to 2/10.    Time  6    Period  Weeks    Status  On-going   over 3/10 pain with ADL's 07/17/19     PT LONG TERM GOAL #3   Title  Patient will demonstrate proper bed mobility and log rolling technique to protect spine during transitional movements.    Time  6    Period  Weeks    Status  On-going   not able to perform proper technique 07/17/19           Plan - 07/24/19 1337    Clinical Impression Statement  Patient had a flare-up of pain and difficulty sleeping last night due to lifting 3 40# bags yesterday.  He did well with treatment today.    Personal Factors and Comorbidities  Comorbidity 1    Comorbidities  lumbar laminectomy L4-L5 05/15/2019; left TKA, HTN    Examination-Activity Limitations  Other;Stand    Examination-Participation Restrictions  Other    Stability/Clinical Decision Making  Evolving/Moderate complexity    Rehab Potential  Good    PT Frequency  2x / week    PT Duration  6 weeks    PT Treatment/Interventions  ADLs/Self Care Home Management;Cryotherapy;Electrical Stimulation;Ultrasound;Traction;Moist Heat;Therapeutic activities;Therapeutic exercise;Manual techniques;Patient/family education;Passive range of motion;Dry needling;Spinal Manipulations    PT Next Visit Plan  cont with POC for  core strengthening and stability, LE strengthening, modalities PRN for pain relief.    PT Home Exercise Plan  see patient education section    Consulted and Agree with Plan of Care  Patient       Patient will benefit from skilled therapeutic intervention in order to improve the following deficits and impairments:  Pain, Decreased mobility, Decreased activity tolerance, Decreased range of motion, Postural dysfunction  Visit Diagnosis: Chronic bilateral low back pain with left-sided sciatica  Muscle weakness (generalized)  Chronic bilateral low back pain  without sciatica     Problem List Patient Active Problem List   Diagnosis Date Noted  . MDD (major depressive disorder) 09/18/2013  . Opiate dependence ([place]) 09/18/2013  Stacy Deshler, [place] MPT 07/24/2019, 2:13 PM  [facility] [address] [place], [place], [address] Phone: [phone]   Fax:  [phone]  Name: Fausto Sampedro MRN: [id number] Date of Birth: 1940/07/21

## 2019-07-28 ENCOUNTER — Ambulatory Visit: Payer: Medicare Other | Admitting: Physical Therapy

## 2019-07-28 ENCOUNTER — Other Ambulatory Visit: Payer: Self-pay

## 2019-07-28 DIAGNOSIS — G8929 Other chronic pain: Secondary | ICD-10-CM

## 2019-07-28 DIAGNOSIS — M6281 Muscle weakness (generalized): Secondary | ICD-10-CM

## 2019-07-28 DIAGNOSIS — M5442 Lumbago with sciatica, left side: Secondary | ICD-10-CM | POA: Diagnosis not present

## 2019-07-28 NOTE — Therapy (Addendum)
[facility] [place], [place], [address] Phone: [phone]   Fax:  [phone]  Physical Therapy Treatment  Patient Details  Name: David Carroll MRN: [id number] Date of Birth: Jan 28, 1941 Referring Provider (PT): Milas Kocher, NP   Encounter Date: 07/28/2019  PT End of Session - 07/28/19 1452    Visit Number  10    Number of Visits  12    Date for PT Re-Evaluation  08/06/19    Authorization Type  FOTO; PROGRESS NOTE AT 10TH VISIT.  KX MODIFIER AFTER 15 VISITS.    PT Start Time  0205    PT Stop Time  0303    PT Time Calculation (min)  58 min    Activity Tolerance  Patient tolerated treatment well    Behavior During Therapy  WFL for tasks assessed/performed       Past Medical History:  Diagnosis Date  . Arthritis    KNEES  . BPH (benign prostatic hypertrophy)   . Complication of anesthesia SLOW RECOVERING FROM ANES. DRUGS   HX OPIATE ADDICTION-- PT ON SUBOXONE  . Depression   . Glaucoma   . History of gastric ulcer   . History of gastroesophageal reflux (GERD)   . History of kidney stones   . Hydronephrosis, left   . Hypertension   . Nephrolithiasis   . Opiate addiction ([place])   . Urgency of urination     Past Surgical History:  Procedure Laterality Date  . CATARACT EXTRACTION W/ INTRAOCULAR LENS  IMPLANT, BILATERAL    . CYSTOSCOPY WITH BIOPSY Left 11/06/2012   Procedure: CYSTOSCOPY WITH BIOPSY;  Surgeon: Alexis Frock, MD;  [facility];  Service: Urology;  Laterality: Left;  . EXTRACORPOREAL SHOCK WAVE LITHOTRIPSY Left 03-06-2011  . HOLMIUM LASER APPLICATION Left 3/74/8270   Procedure: HOLMIUM LASER APPLICATION;  Surgeon: Alexis Frock, MD;  Location: [facility];  Service: Urology;  Laterality: Left;  . INGUINAL HERNIA REPAIR Bilateral 1970   RE-DO LEFT 1985  . LUMBAR DISC SURGERY  09-24-2008   L3 -- L4  . LUMBAR LAMINECTOMY  05/15/2019   with partial facet  joint removal  . MEDIAL COMPARTMENT ARTHROPLASTY, LEFT KNEE  02-22-2010  . REMOVAL BENIGN MASS LEFT LUNG  1977  . VAGOTOMY  1986   ULCERS    There were no vitals filed for this visit.  Subjective Assessment - 07/28/19 1445    Subjective  COVID-19 screen performed prior to patient entering clinic.  Better since lifing bags.                       [facility] Adult PT Treatment/Exercise - 07/28/19 0001      Exercises   Exercises  Knee/Hip      Lumbar Exercises: Aerobic   Nustep  Level 4 x 15 minutes.      Lumbar Exercises: Standing   Other Standing Lumbar Exercises  Blue XTS resisted walking 2 minutes each way (4-ways 8 minutes total).      Modalities   Modalities  Electrical Stimulation;Moist Heat      Moist Heat Therapy   Number Minutes Moist Heat  20 Minutes    Moist Heat Location  Lumbar Spine      Electrical Stimulation   Electrical Stimulation Location  Lower lumbar.    Electrical Stimulation Action  IFC    Electrical Stimulation Parameters  80-150 Hz x 20 minutes.    Electrical Stimulation Goals  Pain  PT Long Term Goals - 07/28/19 1456      PT LONG TERM GOAL #1   Title  Patient will be independent with HEP    Time  6    Period  Weeks    Status  Achieved      PT LONG TERM GOAL #2   Title  Patient will report ability to perform ADLs and lower body dressing with low back pain less than or equal to 2/10.    Time  6    Period  Weeks    Status  On-going      PT LONG TERM GOAL #3   Title  Patient will demonstrate proper bed mobility and log rolling technique to protect spine during transitional movements.    Time  6    Period  Weeks    Status  Achieved            Plan - 07/28/19 1445    Clinical Impression Statement  Patient did well today and feels better since lifting heavy bags last week.  He had scratched himself on the left side of his back.    Personal Factors and Comorbidities  Comorbidity 1     Comorbidities  lumbar laminectomy L4-L5 05/15/2019; left TKA, HTN    Examination-Activity Limitations  Other;Stand    Examination-Participation Restrictions  Other    Stability/Clinical Decision Making  Evolving/Moderate complexity    Rehab Potential  Good    PT Frequency  2x / week    PT Duration  6 weeks    PT Treatment/Interventions  ADLs/Self Care Home Management;Cryotherapy;Electrical Stimulation;Ultrasound;Traction;Moist Heat;Therapeutic activities;Therapeutic exercise;Manual techniques;Patient/family education;Passive range of motion;Dry needling;Spinal Manipulations    PT Next Visit Plan  cont with POC for  core strengthening and stability, LE strengthening, modalities PRN for pain relief.    PT Home Exercise Plan  see patient education section    Consulted and Agree with Plan of Care  Patient       Patient will benefit from skilled therapeutic intervention in order to improve the following deficits and impairments:  Pain, Decreased mobility, Decreased activity tolerance, Decreased range of motion, Postural dysfunction  Visit Diagnosis: Chronic bilateral low back pain with left-sided sciatica  Muscle weakness (generalized)  Chronic bilateral low back pain without sciatica     Problem List Patient Active Problem List   Diagnosis Date Noted  . MDD (major depressive disorder) 09/18/2013  . Opiate dependence ([place]) 09/18/2013   Progress Note Reporting Period 06/18/19 to 07/28/19  See note below for Objective Data and Assessment of Progress/Goals. Very good progress with goals #1 and #3 met at this time.  FOTO limitation has improved by 9%.     Khamani Daniely, [place] MPT 07/28/2019, 3:17 PM  [facility] [address] [place], [place], [address] Phone: [phone]   Fax:  [phone]  Name: David Carroll MRN: [id number] Date of Birth: 28-May-1941

## 2019-07-31 ENCOUNTER — Encounter: Payer: Medicare Other | Admitting: Physical Therapy

## 2019-08-04 ENCOUNTER — Other Ambulatory Visit: Payer: Self-pay

## 2019-08-04 ENCOUNTER — Ambulatory Visit: Payer: Medicare Other | Admitting: Physical Therapy

## 2019-08-04 DIAGNOSIS — G8929 Other chronic pain: Secondary | ICD-10-CM

## 2019-08-04 DIAGNOSIS — M5442 Lumbago with sciatica, left side: Secondary | ICD-10-CM | POA: Diagnosis not present

## 2019-08-04 DIAGNOSIS — M6281 Muscle weakness (generalized): Secondary | ICD-10-CM

## 2019-08-04 NOTE — Therapy (Signed)
[facility] [place], [place], [address] Phone: [phone]   Fax:  [phone]  Physical Therapy Treatment  Patient Details  Name: David Carroll MRN: [id number] Date of Birth: August 15, 1940 Referring Provider (PT): Milas Kocher, NP   Encounter Date: 08/04/2019  PT End of Session - 08/04/19 1339    Visit Number  11    Number of Visits  12    Date for PT Re-Evaluation  08/06/19    Authorization Type  FOTO; PROGRESS NOTE AT 10TH VISIT.  KX MODIFIER AFTER 15 VISITS.    PT Start Time  0105    PT Stop Time  0158    PT Time Calculation (min)  53 min    Activity Tolerance  Patient tolerated treatment well    Behavior During Therapy  WFL for tasks assessed/performed       Past Medical History:  Diagnosis Date  . Arthritis    KNEES  . BPH (benign prostatic hypertrophy)   . Complication of anesthesia SLOW RECOVERING FROM ANES. DRUGS   HX OPIATE ADDICTION-- PT ON SUBOXONE  . Depression   . Glaucoma   . History of gastric ulcer   . History of gastroesophageal reflux (GERD)   . History of kidney stones   . Hydronephrosis, left   . Hypertension   . Nephrolithiasis   . Opiate addiction ([place])   . Urgency of urination     Past Surgical History:  Procedure Laterality Date  . CATARACT EXTRACTION W/ INTRAOCULAR LENS  IMPLANT, BILATERAL    . CYSTOSCOPY WITH BIOPSY Left 11/06/2012   Procedure: CYSTOSCOPY WITH BIOPSY;  Surgeon: Alexis Frock, MD;  Location: [facility];  Service: Urology;  Laterality: Left;  . EXTRACORPOREAL SHOCK WAVE LITHOTRIPSY Left 03-06-2011  . HOLMIUM LASER APPLICATION Left 06/14/7251   Procedure: HOLMIUM LASER APPLICATION;  Surgeon: Alexis Frock, MD;  Location: [facility];  Service: Urology;  Laterality: Left;  . INGUINAL HERNIA REPAIR Bilateral 1970   RE-DO LEFT 1985  . LUMBAR DISC SURGERY  09-24-2008   L3 -- L4  . LUMBAR LAMINECTOMY  05/15/2019   with partial facet  joint removal  . MEDIAL COMPARTMENT ARTHROPLASTY, LEFT KNEE  02-22-2010  . REMOVAL BENIGN MASS LEFT LUNG  1977  . VAGOTOMY  1986   ULCERS    There were no vitals filed for this visit.  Subjective Assessment - 08/04/19 1337    Subjective  COVID-19 screen performed prior to patient entering clinic.  Patient requesting ultrasound again.    Pertinent History  Lumbar laminectomy L4-L5 05/15/2019, Left TKA, HTN    Limitations  House hold activities;Walking    How long can you stand comfortably?  20 minutes.    How long can you walk comfortably?  Short community distances.    Patient Stated Goals  improve mobility and return to exercise routine at the [facility]    Currently in Pain?  Yes    Pain Location  Back    Pain Orientation  Right;Left    Pain Descriptors / Indicators  Discomfort    Pain Type  Chronic pain    Pain Onset  More than a month ago                       [facility] Adult PT Treatment/Exercise - 08/04/19 0001      Exercises   Exercises  Knee/Hip      Lumbar Exercises: Aerobic   Nustep  Level 4  x 15 minutes.      Modalities   Modalities  Electrical Stimulation;Moist Heat;Ultrasound      Moist Heat Therapy   Number Minutes Moist Heat  20 Minutes    Moist Heat Location  Lumbar Spine      Electrical Stimulation   Electrical Stimulation Location  Lower lumbar.    Electrical Stimulation Action  IFC    Electrical Stimulation Parameters  80-150 Hz x 20 minutes.    Electrical Stimulation Goals  Pain      Ultrasound   Ultrasound Location  Low back.    Ultrasound Parameters  U/S at 1.50 w/CM2 x 8 minutes.    Ultrasound Goals  Pain                  PT Long Term Goals - 07/28/19 1456      PT LONG TERM GOAL #1   Title  Patient will be independent with HEP    Time  6    Period  Weeks    Status  Achieved      PT LONG TERM GOAL #2   Title  Patient will report ability to perform ADLs and lower body dressing with low back pain less than or equal to  2/10.    Time  6    Period  Weeks    Status  On-going      PT LONG TERM GOAL #3   Title  Patient will demonstrate proper bed mobility and log rolling technique to protect spine during transitional movements.    Time  6    Period  Weeks    Status  Achieved            Plan - 08/04/19 1340    Clinical Impression Statement  Patient doing better today.  His gait is notably improved.  Continue to recommend a cane for additional safety though.    Personal Factors and Comorbidities  Comorbidity 1    Comorbidities  lumbar laminectomy L4-L5 05/15/2019; left TKA, HTN    Examination-Activity Limitations  Other;Stand    Stability/Clinical Decision Making  Evolving/Moderate complexity    Rehab Potential  Good    PT Frequency  2x / week    PT Duration  6 weeks    PT Treatment/Interventions  ADLs/Self Care Home Management;Cryotherapy;Electrical Stimulation;Ultrasound;Traction;Moist Heat;Therapeutic activities;Therapeutic exercise;Manual techniques;Patient/family education;Passive range of motion;Dry needling;Spinal Manipulations    PT Next Visit Plan  cont with POC for  core strengthening and stability, LE strengthening, modalities PRN for pain relief.    PT Home Exercise Plan  see patient education section    Consulted and Agree with Plan of Care  Patient       Patient will benefit from skilled therapeutic intervention in order to improve the following deficits and impairments:  Pain, Decreased mobility, Decreased activity tolerance, Decreased range of motion, Postural dysfunction  Visit Diagnosis: Chronic bilateral low back pain with left-sided sciatica  Muscle weakness (generalized)  Chronic bilateral low back pain without sciatica     Problem List Patient Active Problem List   Diagnosis Date Noted  . MDD (major depressive disorder) 09/18/2013  . Opiate dependence ([facility]) 09/18/2013    Katrina Daddona, [place] MPT 08/04/2019, 2:16 PM  [facility] [address] [place], [place], [address] Phone: [phone]   Fax:  [phone]  Name: David Carroll MRN: [id number] Date of Birth: [birth year]

## 2019-08-07 ENCOUNTER — Ambulatory Visit: Payer: Medicare Other | Admitting: Physical Therapy

## 2019-08-11 ENCOUNTER — Other Ambulatory Visit: Payer: Self-pay

## 2019-08-11 ENCOUNTER — Ambulatory Visit: Payer: Medicare Other | Attending: Nurse Practitioner | Admitting: Physical Therapy

## 2019-08-11 DIAGNOSIS — G8929 Other chronic pain: Secondary | ICD-10-CM | POA: Insufficient documentation

## 2019-08-11 DIAGNOSIS — M6281 Muscle weakness (generalized): Secondary | ICD-10-CM | POA: Insufficient documentation

## 2019-08-11 DIAGNOSIS — M545 Low back pain: Secondary | ICD-10-CM | POA: Diagnosis present

## 2019-08-11 DIAGNOSIS — M5442 Lumbago with sciatica, left side: Secondary | ICD-10-CM | POA: Diagnosis present

## 2019-08-11 NOTE — Therapy (Signed)
[facility] [place], [place], [address] Phone: [phone]   Fax:  [phone]  Physical Therapy Treatment  Patient Details  Name: David Carroll MRN: [id number] Date of Birth: [birth year] Referring Provider (PT): Milas Kocher, NP   Encounter Date: 08/11/2019  PT End of Session - 08/11/19 1310    Visit Number  12    Number of Visits  12    Date for PT Re-Evaluation  08/06/19    Authorization Type  FOTO; PROGRESS NOTE AT 10TH VISIT.  KX MODIFIER AFTER 15 VISITS.    PT Start Time  0106    PT Stop Time  0154    PT Time Calculation (min)  48 min    Activity Tolerance  Patient tolerated treatment well    Behavior During Therapy  WFL for tasks assessed/performed       Past Medical History:  Diagnosis Date  . Arthritis    KNEES  . BPH (benign prostatic hypertrophy)   . Complication of anesthesia SLOW RECOVERING FROM ANES. DRUGS   HX OPIATE ADDICTION-- PT ON SUBOXONE  . Depression   . Glaucoma   . History of gastric ulcer   . History of gastroesophageal reflux (GERD)   . History of kidney stones   . Hydronephrosis, left   . Hypertension   . Nephrolithiasis   . Opiate addiction ([place])   . Urgency of urination     Past Surgical History:  Procedure Laterality Date  . CATARACT EXTRACTION W/ INTRAOCULAR LENS  IMPLANT, BILATERAL    . CYSTOSCOPY WITH BIOPSY Left 11/06/2012   Procedure: CYSTOSCOPY WITH BIOPSY;  Surgeon: Alexis Frock, MD;  Location: [facility];  Service: Urology;  Laterality: Left;  . EXTRACORPOREAL SHOCK WAVE LITHOTRIPSY Left 03-06-2011  . HOLMIUM LASER APPLICATION Left 8/50/2774   Procedure: HOLMIUM LASER APPLICATION;  Surgeon: Alexis Frock, MD;  Location: [facility];  Service: Urology;  Laterality: Left;  . INGUINAL HERNIA REPAIR Bilateral 1970   RE-DO LEFT 1985  . LUMBAR DISC SURGERY  09-24-2008   L3 -- L4  . LUMBAR LAMINECTOMY  05/15/2019   with partial facet  joint removal  . MEDIAL COMPARTMENT ARTHROPLASTY, LEFT KNEE  02-22-2010  . REMOVAL BENIGN MASS LEFT LUNG  1977  . VAGOTOMY  1986   ULCERS    There were no vitals filed for this visit.  Subjective Assessment - 08/11/19 1308    Subjective  COVID-19 screen performed prior to patient entering clinic.  Patient arrived and saw the MD and is to continue therapy    Pertinent History  Lumbar laminectomy L4-L5 05/15/2019, Left TKA, HTN    Limitations  House hold activities;Walking    How long can you stand comfortably?  20 minutes.    How long can you walk comfortably?  Short community distances.    Patient Stated Goals  improve mobility and return to exercise routine at the [facility]    Currently in Pain?  Yes    Pain Score  3     Pain Location  Back    Pain Orientation  Right;Left    Pain Descriptors / Indicators  Discomfort    Pain Type  Chronic pain    Pain Onset  More than a month ago    Pain Frequency  Intermittent    Aggravating Factors   prolong standing / activity    Pain Relieving Factors  at rest  [facility] Adult PT Treatment/Exercise - 08/11/19 0001      Lumbar Exercises: Aerobic   Nustep  Level 4 x 15 minutes.      Moist Heat Therapy   Number Minutes Moist Heat  15 Minutes    Moist Heat Location  Lumbar Spine      Electrical Stimulation   Electrical Stimulation Location  Lower lumbar.    Electrical Stimulation Action  IFC    Electrical Stimulation Parameters  80-150hz  x24min    Electrical Stimulation Goals  Pain      Ultrasound   Ultrasound Location  Bil low back    Ultrasound Parameters  [place] @1 .51/cm2/100%/77mhz x72min    Ultrasound Goals  Pain                  PT Long Term Goals - 08/11/19 1344      PT LONG TERM GOAL #1   Title  Patient will be independent with HEP    Time  6    Period  Weeks    Status  Achieved      PT LONG TERM GOAL #2   Title  Patient will report ability to perform ADLs and lower body dressing with  low back pain less than or equal to 2/10.    Time  6    Period  Weeks    Status  On-going   pain at 3-4/10 and some difficulty with putting on shoes 08/11/19     PT LONG TERM GOAL #3   Title  Patient will demonstrate proper bed mobility and log rolling technique to protect spine during transitional movements.    Time  6    Period  Weeks    Status  Achieved            Plan - 08/11/19 1344    Clinical Impression Statement  Patient tolerated treatment well today. Patient feels like 10/11/19 has helped decrease pain in low back. Patient is able to perform ADL's with greater ease yet some difficulty with putting on shoes. Patient went to MD and was told to cont with therapy to increase exercise. Patient goals are progressing.    Personal Factors and Comorbidities  Comorbidity 1    Comorbidities  lumbar laminectomy L4-L5 05/15/2019; left TKA, HTN    Examination-Activity Limitations  Other;Stand    Examination-Participation Restrictions  Other    Stability/Clinical Decision Making  Evolving/Moderate complexity    Rehab Potential  Good    PT Frequency  2x / week    PT Duration  6 weeks    PT Treatment/Interventions  ADLs/Self Care Home Management;Cryotherapy;Electrical Stimulation;Ultrasound;Traction;Moist Heat;Therapeutic activities;Therapeutic exercise;Manual techniques;Patient/family education;Passive range of motion;Dry needling;Spinal Manipulations    PT Next Visit Plan  cont with POC for  core strengthening and stability, LE strengthening, modalities PRN for pain relief. sent for re-cert awaitng signature    Consulted and Agree with Plan of Care  Patient       Patient will benefit from skilled therapeutic intervention in order to improve the following deficits and impairments:  Pain, Decreased mobility, Decreased activity tolerance, Decreased range of motion, Postural dysfunction  Visit Diagnosis: Chronic bilateral low back pain with left-sided sciatica  Muscle weakness  (generalized)  Chronic bilateral low back pain without sciatica     Problem List Patient Active Problem List   Diagnosis Date Noted  . MDD (major depressive disorder) 09/18/2013  . Opiate dependence ([facility]) 09/18/2013    11/18/2013, PTA 08/11/19 1:58 PM  [facility] [address] [place], [place], [address] Phone: [phone]   Fax:  [phone]  Name: David Carroll MRN: [id number] Date of Birth: 1940/12/11

## 2019-08-14 ENCOUNTER — Encounter: Payer: Self-pay | Admitting: Physical Therapy

## 2019-08-14 ENCOUNTER — Other Ambulatory Visit: Payer: Self-pay

## 2019-08-14 ENCOUNTER — Ambulatory Visit: Payer: Medicare Other | Admitting: Physical Therapy

## 2019-08-14 DIAGNOSIS — M545 Low back pain, unspecified: Secondary | ICD-10-CM

## 2019-08-14 DIAGNOSIS — M5442 Lumbago with sciatica, left side: Secondary | ICD-10-CM

## 2019-08-14 DIAGNOSIS — G8929 Other chronic pain: Secondary | ICD-10-CM

## 2019-08-14 DIAGNOSIS — M6281 Muscle weakness (generalized): Secondary | ICD-10-CM

## 2019-08-14 NOTE — Therapy (Signed)
[facility] [address] [place], [place], [address] Phone: [phone]   Fax:  [phone]  Physical Therapy Treatment  Patient Details  Name: David Carroll MRN: [id number] Date of Birth: [birth year] Referring Provider (PT): Rueben Bash, NP   Encounter Date: 08/14/2019  PT End of Session - 08/14/19 1433    Visit Number  13    Number of Visits  18    Date for PT Re-Evaluation  09/03/19    Authorization Type  FOTO; PROGRESS NOTE AT 10TH VISIT.  KX MODIFIER AFTER 15 VISITS.    PT Start Time  0100    PT Stop Time  0157    PT Time Calculation (min)  57 min    Activity Tolerance  Patient tolerated treatment well    Behavior During Therapy  WFL for tasks assessed/performed       Past Medical History:  Diagnosis Date  . Arthritis    KNEES  . BPH (benign prostatic hypertrophy)   . Complication of anesthesia SLOW RECOVERING FROM ANES. DRUGS   HX OPIATE ADDICTION-- PT ON SUBOXONE  . Depression   . Glaucoma   . History of gastric ulcer   . History of gastroesophageal reflux (GERD)   . History of kidney stones   . Hydronephrosis, left   . Hypertension   . Nephrolithiasis   . Opiate addiction ([facility])   . Urgency of urination     Past Surgical History:  Procedure Laterality Date  . CATARACT EXTRACTION W/ INTRAOCULAR LENS  IMPLANT, BILATERAL    . CYSTOSCOPY WITH BIOPSY Left 11/06/2012   Procedure: CYSTOSCOPY WITH BIOPSY;  Surgeon: Sebastian Ache, MD;  Location: [facility];  Service: Urology;  Laterality: Left;  . EXTRACORPOREAL SHOCK WAVE LITHOTRIPSY Left 03-06-2011  . HOLMIUM LASER APPLICATION Left 11/06/2012   Procedure: HOLMIUM LASER APPLICATION;  Surgeon: Sebastian Ache, MD;  Location: [facility];  Service: Urology;  Laterality: Left;  . INGUINAL HERNIA REPAIR Bilateral 1970   RE-DO LEFT 1985  . LUMBAR DISC SURGERY  09-24-2008   L3 -- L4  . LUMBAR LAMINECTOMY  05/15/2019   with partial facet  joint removal  . MEDIAL COMPARTMENT ARTHROPLASTY, LEFT KNEE  02-22-2010  . REMOVAL BENIGN MASS LEFT LUNG  1977  . VAGOTOMY  1986   ULCERS    There were no vitals filed for this visit.  Subjective Assessment - 08/14/19 1416    Subjective  COVID-19 screen performed prior to patient entering clinic.  I feel much better.  Would like to go longer on Nustep today.    Pertinent History  Lumbar laminectomy L4-L5 05/15/2019, Left TKA, HTN    Limitations  House hold activities;Walking    How long can you stand comfortably?  20 minutes.    How long can you walk comfortably?  Short community distances.    Patient Stated Goals  improve mobility and return to exercise routine at the [facility]    Currently in Pain?  Yes    Pain Score  3     Pain Location  Back    Pain Orientation  Right;Left    Pain Descriptors / Indicators  Discomfort    Pain Type  Chronic pain    Pain Onset  More than a month ago                       [facility] Adult PT Treatment/Exercise - 08/14/19 0001      Exercises  Exercises  Knee/Hip      Lumbar Exercises: Aerobic   Nustep  Level 4 x 10 minutes and level 2 x 10 minutes (20 minutes total).      Moist Heat Therapy   Number Minutes Moist Heat  20 Minutes    Moist Heat Location  Lumbar Spine      Electrical Stimulation   Electrical Stimulation Location  Lower lumbar.    Electrical Stimulation Action  IFC    Electrical Stimulation Parameters  80-150 Hz x 20 minutes.    Electrical Stimulation Goals  Pain      Manual Therapy   Manual Therapy  Passive ROM    Passive ROM  1-1 hip passive range of motion 3 minutes to each hip (6 minutes total).                  PT Long Term Goals - 08/11/19 1344      PT LONG TERM GOAL #1   Title  Patient will be independent with HEP    Time  6    Period  Weeks    Status  Achieved      PT LONG TERM GOAL #2   Title  Patient will report ability to perform ADLs and lower body dressing with low back pain less  than or equal to 2/10.    Time  6    Period  Weeks    Status  On-going   pain at 3-4/10 and some difficulty with putting on shoes 08/11/19     PT LONG TERM GOAL #3   Title  Patient will demonstrate proper bed mobility and log rolling technique to protect spine during transitional movements.    Time  6    Period  Weeks    Status  Achieved            Plan - 08/14/19 1435    Clinical Impression Statement  Patient very pleased with his progress.  He is walking much better.  He states he is able to put his shoes ans=d socks on much easier now.    Personal Factors and Comorbidities  Comorbidity 1    Comorbidities  lumbar laminectomy L4-L5 05/15/2019; left TKA, HTN    Examination-Participation Restrictions  Other    Stability/Clinical Decision Making  Evolving/Moderate complexity    Rehab Potential  Good    PT Frequency  2x / week    PT Duration  6 weeks    PT Treatment/Interventions  ADLs/Self Care Home Management;Cryotherapy;Electrical Stimulation;Ultrasound;Traction;Moist Heat;Therapeutic activities;Therapeutic exercise;Manual techniques;Patient/family education;Passive range of motion;Dry needling;Spinal Manipulations    PT Next Visit Plan  cont with POC for  core strengthening and stability, LE strengthening, modalities PRN for pain relief. sent for re-cert awaitng signature    PT Home Exercise Plan  see patient education section    Consulted and Agree with Plan of Care  Patient       Patient will benefit from skilled therapeutic intervention in order to improve the following deficits and impairments:  Pain, Decreased mobility, Decreased activity tolerance, Decreased range of motion, Postural dysfunction  Visit Diagnosis: Chronic bilateral low back pain with left-sided sciatica  Muscle weakness (generalized)  Chronic bilateral low back pain without sciatica     Problem List Patient Active Problem List   Diagnosis Date Noted  . MDD (major depressive disorder) 09/18/2013   . Opiate dependence ([facility]) 09/18/2013    Dayra Rapley, [place] MPT 08/14/2019, 2:41 PM  [facility] [address]  [place], [place], [address] Phone: [phone]   Fax:  [phone]  Name: David Carroll MRN: [id number] Date of Birth: [birth year]

## 2019-08-18 ENCOUNTER — Ambulatory Visit: Payer: Medicare Other | Admitting: Physical Therapy

## 2019-08-21 ENCOUNTER — Other Ambulatory Visit: Payer: Self-pay

## 2019-08-21 ENCOUNTER — Ambulatory Visit: Payer: Medicare Other | Admitting: Physical Therapy

## 2019-09-04 ENCOUNTER — Ambulatory Visit: Payer: Medicare Other | Admitting: Physical Therapy

## 2019-09-04 ENCOUNTER — Encounter: Payer: Self-pay | Admitting: Physical Therapy

## 2019-09-04 DIAGNOSIS — M545 Low back pain, unspecified: Secondary | ICD-10-CM

## 2019-09-04 DIAGNOSIS — M6281 Muscle weakness (generalized): Secondary | ICD-10-CM

## 2019-09-04 DIAGNOSIS — G8929 Other chronic pain: Secondary | ICD-10-CM

## 2019-09-04 DIAGNOSIS — M5442 Lumbago with sciatica, left side: Secondary | ICD-10-CM | POA: Diagnosis not present

## 2019-09-04 NOTE — Therapy (Signed)
[facility] [address] [place], [place], [address] Phone: [phone]   Fax:  [phone]  Physical Therapy Treatment  Patient Details  Name: David Carroll MRN: [id number] Date of Birth: 23-Aug-1940 Referring Provider (PT): Rueben Bash, NP   Encounter Date: 09/04/2019  PT End of Session - 09/04/19 1703    Visit Number  14    Number of Visits  18    Date for PT Re-Evaluation  10/10/19    Authorization Type  FOTO; PROGRESS NOTE AT 10TH VISIT.  KX MODIFIER AFTER 15 VISITS.    PT Start Time  1700    PT Stop Time  1740    PT Time Calculation (min)  40 min    Activity Tolerance  Patient tolerated treatment well    Behavior During Therapy  WFL for tasks assessed/performed       Past Medical History:  Diagnosis Date  . Arthritis    KNEES  . BPH (benign prostatic hypertrophy)   . Complication of anesthesia SLOW RECOVERING FROM ANES. DRUGS   HX OPIATE ADDICTION-- PT ON SUBOXONE  . Depression   . Glaucoma   . History of gastric ulcer   . History of gastroesophageal reflux (GERD)   . History of kidney stones   . Hydronephrosis, left   . Hypertension   . Nephrolithiasis   . Opiate addiction (HCC)   . Urgency of urination     Past Surgical History:  Procedure Laterality Date  . CATARACT EXTRACTION W/ INTRAOCULAR LENS  IMPLANT, BILATERAL    . CYSTOSCOPY WITH BIOPSY Left 11/06/2012   Procedure: CYSTOSCOPY WITH BIOPSY;  Surgeon: Sebastian Ache, MD;  Location: [facility];  Service: Urology;  Laterality: Left;  . EXTRACORPOREAL SHOCK WAVE LITHOTRIPSY Left 03-06-2011  . HOLMIUM LASER APPLICATION Left 11/06/2012   Procedure: HOLMIUM LASER APPLICATION;  Surgeon: Sebastian Ache, MD;  Location: [facility];  Service: Urology;  Laterality: Left;  . INGUINAL HERNIA REPAIR Bilateral 1970   RE-DO LEFT 1985  . LUMBAR DISC SURGERY  09-24-2008   L3 -- L4  . LUMBAR LAMINECTOMY  05/15/2019   with partial facet  joint removal  . MEDIAL COMPARTMENT ARTHROPLASTY, LEFT KNEE  02-22-2010  . REMOVAL BENIGN MASS LEFT LUNG  1977  . VAGOTOMY  1986   ULCERS    There were no vitals filed for this visit.  Subjective Assessment - 09/04/19 1701    Subjective  COVID-19 screen performed prior to patient entering clinic. Reports more dull ache today in low back and any activity causes more pain.    Pertinent History  Lumbar laminectomy L4-L5 05/15/2019, Left TKA, HTN    Limitations  House hold activities;Walking    How long can you stand comfortably?  20 minutes.    How long can you walk comfortably?  Short community distances.    Patient Stated Goals  improve mobility and return to exercise routine at the [facility]    Currently in Pain?  Yes    Pain Score  3     Pain Location  Back    Pain Orientation  Lower    Pain Descriptors / Indicators  Dull;Aching    Pain Type  Chronic pain    Pain Onset  More than a month ago    Pain Frequency  Constant         [facility] PT Assessment - 09/04/19 0001      Assessment   Medical Diagnosis  Disc Degeneration, lumbar  Referring Provider (PT)  Rueben Bash, NP    Onset Date/Surgical Date  05/15/19    Prior Therapy  yes      Precautions   Precautions  Fall;Back    Precaution Comments  No bending, twisting, and liftinger greater than 15 lb                   [facility] Adult PT Treatment/Exercise - 09/04/19 0001      Lumbar Exercises: Aerobic   Nustep  L3 x10 min      Lumbar Exercises: Supine   Ab Set  10 reps;5 seconds    Clam  20 reps    Clam Limitations  yellow    Bent Knee Raise  10 reps    Bridge  20 reps;2 seconds    Straight Leg Raise  20 reps      Modalities   Modalities  Electrical Stimulation;Moist Heat;Ultrasound      Moist Heat Therapy   Number Minutes Moist Heat  10 Minutes    Moist Heat Location  Lumbar Spine      Electrical Stimulation   Electrical Stimulation Location  Lower lumbar.    Electrical Stimulation Action  IFC     Electrical Stimulation Parameters  80-150 hz x10 min    Electrical Stimulation Goals  Pain                  PT Long Term Goals - 08/11/19 1344      PT LONG TERM GOAL #1   Title  Patient will be independent with HEP    Time  6    Period  Weeks    Status  Achieved      PT LONG TERM GOAL #2   Title  Patient will report ability to perform ADLs and lower body dressing with low back pain less than or equal to 2/10.    Time  6    Period  Weeks    Status  On-going   pain at 3-4/10 and some difficulty with putting on shoes 08/11/19     PT LONG TERM GOAL #3   Title  Patient will demonstrate proper bed mobility and log rolling technique to protect spine during transitional movements.    Time  6    Period  Weeks    Status  Achieved            Plan - 09/04/19 1739    Clinical Impression Statement  Patient presented in clinic with reports of dull ache in low back. Patient guided through more low level core and lumbar strengthening exercises with emphasis on correct core activation technique. Patient denied any sharp abdominal pains that he had had previously. Normal modalities response noted following removal of the modalties.    Personal Factors and Comorbidities  Comorbidity 1    Comorbidities  lumbar laminectomy L4-L5 05/15/2019; left TKA, HTN    Examination-Activity Limitations  Other;Stand    Examination-Participation Restrictions  Other    Stability/Clinical Decision Making  Evolving/Moderate complexity    Rehab Potential  Good    PT Frequency  2x / week    PT Duration  6 weeks    PT Treatment/Interventions  ADLs/Self Care Home Management;Cryotherapy;Electrical Stimulation;Ultrasound;Traction;Moist Heat;Therapeutic activities;Therapeutic exercise;Manual techniques;Patient/family education;Passive range of motion;Dry needling;Spinal Manipulations    PT Next Visit Plan  cont with POC for  core strengthening and stability, LE strengthening, modalities PRN for pain relief.  sent for re-cert awaitng signature    PT Home Exercise  Plan  see patient education section    Consulted and Agree with Plan of Care  Patient       Patient will benefit from skilled therapeutic intervention in order to improve the following deficits and impairments:  Pain, Decreased mobility, Decreased activity tolerance, Decreased range of motion, Postural dysfunction  Visit Diagnosis: Chronic bilateral low back pain with left-sided sciatica  Muscle weakness (generalized)  Chronic bilateral low back pain without sciatica     Problem List Patient Active Problem List   Diagnosis Date Noted  . MDD (major depressive disorder) 09/18/2013  . Opiate dependence ([place]) 09/18/2013    Standley Brooking, PTA 09/04/2019, 6:05 PM  [facility] [address] [place], [place], [address] Phone: [phone]   Fax:  [phone]  Name: David Carroll MRN: [id number] Date of Birth: 1940-10-02

## 2019-09-10 ENCOUNTER — Ambulatory Visit: Payer: Medicare Other | Admitting: Physical Therapy

## 2019-09-11 ENCOUNTER — Ambulatory Visit: Payer: Medicare Other | Attending: Nurse Practitioner | Admitting: Physical Therapy

## 2019-09-11 ENCOUNTER — Other Ambulatory Visit: Payer: Self-pay

## 2019-09-11 ENCOUNTER — Encounter: Payer: Self-pay | Admitting: Physical Therapy

## 2019-09-11 DIAGNOSIS — M5442 Lumbago with sciatica, left side: Secondary | ICD-10-CM | POA: Insufficient documentation

## 2019-09-11 DIAGNOSIS — M545 Low back pain, unspecified: Secondary | ICD-10-CM

## 2019-09-11 DIAGNOSIS — M6281 Muscle weakness (generalized): Secondary | ICD-10-CM | POA: Diagnosis present

## 2019-09-11 DIAGNOSIS — G8929 Other chronic pain: Secondary | ICD-10-CM | POA: Diagnosis present

## 2019-09-11 NOTE — Therapy (Signed)
[facility] [address] [place], [place], [address] Phone: [phone]   Fax:  [phone]  Physical Therapy Treatment  Patient Details  Name: David Carroll MRN: [id number] Date of Birth: [birth year] Referring Provider (PT): Rueben Bash, NP   Encounter Date: 09/11/2019  PT End of Session - 09/11/19 1322    Visit Number  15    Number of Visits  18    Date for PT Re-Evaluation  10/10/19    Authorization Type  FOTO; PROGRESS NOTE AT 10TH VISIT.  KX MODIFIER AFTER 15 VISITS.    PT Start Time  1310    PT Stop Time  1350    PT Time Calculation (min)  40 min    Activity Tolerance  Patient tolerated treatment well    Behavior During Therapy  WFL for tasks assessed/performed       Past Medical History:  Diagnosis Date  . Arthritis    KNEES  . BPH (benign prostatic hypertrophy)   . Complication of anesthesia SLOW RECOVERING FROM ANES. DRUGS   HX OPIATE ADDICTION-- PT ON SUBOXONE  . Depression   . Glaucoma   . History of gastric ulcer   . History of gastroesophageal reflux (GERD)   . History of kidney stones   . Hydronephrosis, left   . Hypertension   . Nephrolithiasis   . Opiate addiction (HCC)   . Urgency of urination     Past Surgical History:  Procedure Laterality Date  . CATARACT EXTRACTION W/ INTRAOCULAR LENS  IMPLANT, BILATERAL    . CYSTOSCOPY WITH BIOPSY Left 11/06/2012   Procedure: CYSTOSCOPY WITH BIOPSY;  Surgeon: Sebastian Ache, MD;  Location: [facility];  Service: Urology;  Laterality: Left;  . EXTRACORPOREAL SHOCK WAVE LITHOTRIPSY Left 03-06-2011  . HOLMIUM LASER APPLICATION Left 11/06/2012   Procedure: HOLMIUM LASER APPLICATION;  Surgeon: Sebastian Ache, MD;  Location: [facility];  Service: Urology;  Laterality: Left;  . INGUINAL HERNIA REPAIR Bilateral 1970   RE-DO LEFT 1985  . LUMBAR DISC SURGERY  09-24-2008   L3 -- L4  . LUMBAR LAMINECTOMY  05/15/2019   with partial facet  joint removal  . MEDIAL COMPARTMENT ARTHROPLASTY, LEFT KNEE  02-22-2010  . REMOVAL BENIGN MASS LEFT LUNG  1977  . VAGOTOMY  1986   ULCERS    There were no vitals filed for this visit.  Subjective Assessment - 09/11/19 1318    Subjective  COVID-19 screen performed prior to patient entering clinic. Pt reporting 8/10 pain in his low back. Pt reported he didn't sleep well on Tuesady night and he ended up sitting down most of the day yesterday and he feels like that may have made it worse.    Pertinent History  Lumbar laminectomy L4-L5 05/15/2019, Left TKA, HTN    How long can you stand comfortably?  20 minutes.    How long can you walk comfortably?  Short community distances.    Patient Stated Goals  improve mobility and return to exercise routine at the [facility]    Currently in Pain?  Yes    Pain Score  8     Pain Location  Back    Pain Orientation  Lower    Pain Descriptors / Indicators  Dull;Aching    Pain Type  Chronic pain    Pain Onset  More than a month ago  [place] Adult PT Treatment/Exercise - 09/11/19 0001      Lumbar Exercises: Stretches   Single Knee to Chest Stretch  Right;Left;3 reps;20 seconds    Double Knee to Chest Stretch  2 reps;20 seconds    Lower Trunk Rotation  3 reps;10 seconds      Lumbar Exercises: Supine   Ab Set  10 reps;5 seconds    Clam  20 reps    Clam Limitations  red    Bent Knee Raise  10 reps    Bridge  20 reps;2 seconds    Straight Leg Raise  20 reps      Modalities   Modalities  Electrical Stimulation;Moist Heat;Ultrasound      Moist Heat Therapy   Number Minutes Moist Heat  10 Minutes    Moist Heat Location  Lumbar Spine      Electrical Stimulation   Electrical Stimulation Location  lumbar    Electrical Stimulation Action  IFC    Electrical Stimulation Parameters  80-150 Hz x 10 minutes    Electrical Stimulation Goals  Pain                  PT Long Term Goals - 09/11/19 1337      PT LONG  TERM GOAL #1   Title  Patient will be independent with HEP    Period  Weeks    Status  Achieved      PT LONG TERM GOAL #2   Title  Patient will report ability to perform ADLs and lower body dressing with low back pain less than or equal to 2/10.    Time  6    Period  Weeks    Status  On-going      PT LONG TERM GOAL #3   Title  Patient will demonstrate proper bed mobility and log rolling technique to protect spine during transitional movements.    Time  6    Period  Weeks    Status  Achieved            Plan - 09/11/19 1323    Clinical Impression Statement  Pt arriving to therapy 10 minutes late. Pt reporting 8/10 pain in his low back. Pt tolerating exericses but limited by pain.Following streching, E-stim and heat, pt reporting pain decreased to 6-7/10.  Skilled PT needed to contiue to address pt's impairments with the below interventions.    Personal Factors and Comorbidities  Comorbidity 1    Comorbidities  lumbar laminectomy L4-L5 05/15/2019; left TKA, HTN    Examination-Activity Limitations  Other;Stand    Examination-Participation Restrictions  Other    Stability/Clinical Decision Making  Evolving/Moderate complexity    Rehab Potential  Good    PT Frequency  2x / week    PT Treatment/Interventions  ADLs/Self Care Home Management;Cryotherapy;Electrical Stimulation;Ultrasound;Traction;Moist Heat;Therapeutic activities;Therapeutic exercise;Manual techniques;Patient/family education;Passive range of motion;Dry needling;Spinal Manipulations    PT Next Visit Plan  cont with POC for  core strengthening and stability, LE strengthening, modalities PRN for pain relief. sent for re-cert awaitng signature    PT Home Exercise Plan  see patient education section    Consulted and Agree with Plan of Care  Patient       Patient will benefit from skilled therapeutic intervention in order to improve the following deficits and impairments:  Pain, Decreased mobility, Decreased activity  tolerance, Decreased range of motion, Postural dysfunction  Visit Diagnosis: Chronic bilateral low back pain with left-sided sciatica  Muscle weakness (generalized)  Chronic bilateral  low back pain without sciatica     Problem List Patient Active Problem List   Diagnosis Date Noted  . MDD (major depressive disorder) 09/18/2013  . Opiate dependence (HCC) 09/18/2013    Sharmon Leyden,  MPT 09/11/2019, 1:38 PM  [facility] [address] [place], [place], [address] Phone: [phone]   Fax:  [phone]  Name: David Carroll MRN: [id number] Date of Birth: June 30, 1940

## 2019-09-15 ENCOUNTER — Ambulatory Visit: Payer: Medicare Other | Admitting: Physical Therapy

## 2019-09-15 ENCOUNTER — Encounter: Payer: Self-pay | Admitting: Physical Therapy

## 2019-09-15 ENCOUNTER — Other Ambulatory Visit: Payer: Self-pay

## 2019-09-15 DIAGNOSIS — G8929 Other chronic pain: Secondary | ICD-10-CM

## 2019-09-15 DIAGNOSIS — M5442 Lumbago with sciatica, left side: Secondary | ICD-10-CM

## 2019-09-15 DIAGNOSIS — M6281 Muscle weakness (generalized): Secondary | ICD-10-CM

## 2019-09-15 NOTE — Therapy (Signed)
[facility] [address] [place], [place], [address] Phone: [phone]   Fax:  [phone]  Physical Therapy Treatment  Patient Details  Name: David Carroll MRN: [id number] Date of Birth: [birth year] Referring Provider (PT): Rueben Bash, NP   Encounter Date: 09/15/2019  PT End of Session - 09/15/19 1524    Visit Number  16    Number of Visits  18    Date for PT Re-Evaluation  10/10/19    Authorization Type  FOTO; PROGRESS NOTE AT 10TH VISIT.  KX MODIFIER AFTER 15 VISITS.    PT Start Time  1518    PT Stop Time  1604    PT Time Calculation (min)  46 min    Activity Tolerance  Patient tolerated treatment well    Behavior During Therapy  WFL for tasks assessed/performed       Past Medical History:  Diagnosis Date  . Arthritis    KNEES  . BPH (benign prostatic hypertrophy)   . Complication of anesthesia SLOW RECOVERING FROM ANES. DRUGS   HX OPIATE ADDICTION-- PT ON SUBOXONE  . Depression   . Glaucoma   . History of gastric ulcer   . History of gastroesophageal reflux (GERD)   . History of kidney stones   . Hydronephrosis, left   . Hypertension   . Nephrolithiasis   . Opiate addiction (HCC)   . Urgency of urination     Past Surgical History:  Procedure Laterality Date  . CATARACT EXTRACTION W/ INTRAOCULAR LENS  IMPLANT, BILATERAL    . CYSTOSCOPY WITH BIOPSY Left 11/06/2012   Procedure: CYSTOSCOPY WITH BIOPSY;  Surgeon: Sebastian Ache, MD;  Location: [facility];  Service: Urology;  Laterality: Left;  . EXTRACORPOREAL SHOCK WAVE LITHOTRIPSY Left 03-06-2011  . HOLMIUM LASER APPLICATION Left 11/06/2012   Procedure: HOLMIUM LASER APPLICATION;  Surgeon: Sebastian Ache, MD;  Location: [facility];  Service: Urology;  Laterality: Left;  . INGUINAL HERNIA REPAIR Bilateral 1970   RE-DO LEFT 1985  . LUMBAR DISC SURGERY  09-24-2008   L3 -- L4  . LUMBAR LAMINECTOMY  05/15/2019   with partial facet  joint removal  . MEDIAL COMPARTMENT ARTHROPLASTY, LEFT KNEE  02-22-2010  . REMOVAL BENIGN MASS LEFT LUNG  1977  . VAGOTOMY  1986   ULCERS    There were no vitals filed for this visit.  Subjective Assessment - 09/15/19 1526    Subjective  COVID-19 screening performed upon arrival. Patient arrives with no pain right now in the low back but states the more he does, the more the pain increases.    Pertinent History  Lumbar laminectomy L4-L5 05/15/2019, Left TKA, HTN    Limitations  House hold activities;Walking    How long can you stand comfortably?  20 minutes.    How long can you walk comfortably?  Short community distances.    Patient Stated Goals  improve mobility and return to exercise routine at the [facility]    Currently in Pain?  No/denies    Pain Score  0-No pain         [facility] PT Assessment - 09/15/19 0001      Assessment   Medical Diagnosis  Disc Degeneration, lumbar    Referring Provider (PT)  Rueben Bash, NP    Onset Date/Surgical Date  05/15/19    Next MD Visit  none    Prior Therapy  yes      Precautions   Precautions  Fall;Back  Precaution Comments  No bending, twisting, and liftinger greater than 15 lb                   [facility] Adult PT Treatment/Exercise - 09/15/19 0001      Lumbar Exercises: Stretches   Single Knee to Chest Stretch  Right;Left;3 reps;30 seconds    Double Knee to Chest Stretch  2 reps;30 seconds    Figure 4 Stretch  3 reps;30 seconds      Lumbar Exercises: Aerobic   Nustep  L4 x10 min      Lumbar Exercises: Supine   Ab Set  5 seconds;15 reps    Clam  20 reps    Clam Limitations  red    Bent Knee Raise  20 reps    Bridge with Ball Squeeze  20 reps;2 seconds    Straight Leg Raise  20 reps      Modalities   Modalities  Electrical Stimulation;Moist Heat;Ultrasound      Moist Heat Therapy   Number Minutes Moist Heat  10 Minutes    Moist Heat Location  Lumbar Spine      Electrical Stimulation   Electrical Stimulation  Location  lumbar    Electrical Stimulation Action  IFC    Electrical Stimulation Parameters  80-150 hz x10 mins    Electrical Stimulation Goals  Pain             PT Education - 09/15/19 1553    Education Details  Piriformis stretching supine and seated    Person(s) Educated  Patient    Methods  Explanation;Demonstration;Handout    Comprehension  Verbalized understanding;Returned demonstration          PT Long Term Goals - 09/11/19 1337      PT LONG TERM GOAL #1   Title  Patient will be independent with HEP    Period  Weeks    Status  Achieved      PT LONG TERM GOAL #2   Title  Patient will report ability to perform ADLs and lower body dressing with low back pain less than or equal to 2/10.    Time  6    Period  Weeks    Status  On-going      PT LONG TERM GOAL #3   Title  Patient will demonstrate proper bed mobility and log rolling technique to protect spine during transitional movements.    Time  6    Period  Weeks    Status  Achieved            Plan - 09/15/19 1538    Clinical Impression Statement  Patient was able to tolerate treatment fairly well with minimal reports of increased pain. Patient guided through TE to which he was able to complete with good form. No adverse affects upon removal of modalities.    Personal Factors and Comorbidities  Comorbidity 1    Comorbidities  lumbar laminectomy L4-L5 05/15/2019; left TKA, HTN    Examination-Activity Limitations  Other;Stand    Examination-Participation Restrictions  Other    Stability/Clinical Decision Making  Evolving/Moderate complexity    Clinical Decision Making  Low    Rehab Potential  Good    PT Frequency  2x / week    PT Duration  6 weeks    PT Treatment/Interventions  ADLs/Self Care Home Management;Cryotherapy;Electrical Stimulation;Ultrasound;Traction;Moist Heat;Therapeutic activities;Therapeutic exercise;Manual techniques;Patient/family education;Passive range of motion;Dry needling;Spinal  Manipulations    PT Next Visit Plan  cont with POC for  core strengthening and stability, LE strengthening, modalities PRN for pain relief. sent for re-cert awaitng signature    PT Home Exercise Plan  see patient education section    Consulted and Agree with Plan of Care  Patient       Patient will benefit from skilled therapeutic intervention in order to improve the following deficits and impairments:  Pain, Decreased mobility, Decreased activity tolerance, Decreased range of motion, Postural dysfunction  Visit Diagnosis: Chronic bilateral low back pain with left-sided sciatica  Muscle weakness (generalized)     Problem List Patient Active Problem List   Diagnosis Date Noted  . MDD (major depressive disorder) 09/18/2013  . Opiate dependence ([place]) 09/18/2013    Gabriela Eves, PT, DPT 09/15/2019, 4:41 PM  [facility] [address] [place], [place], [address] Phone: [phone]   Fax:  [phone]  Name: David Carroll MRN: [id number] Date of Birth: 1940/07/31

## 2019-09-22 ENCOUNTER — Encounter: Payer: Medicare Other | Admitting: Physical Therapy

## 2019-09-27 ENCOUNTER — Other Ambulatory Visit: Payer: Self-pay

## 2019-09-27 ENCOUNTER — Observation Stay (HOSPITAL_COMMUNITY)
Admission: EM | Admit: 2019-09-27 | Discharge: 2019-09-28 | Disposition: A | Discharge status: Home / Self Care | Payer: Medicare Other | Attending: Internal Medicine | Admitting: Internal Medicine

## 2019-09-27 ENCOUNTER — Encounter (HOSPITAL_COMMUNITY): Payer: Self-pay | Admitting: Emergency Medicine

## 2019-09-27 ENCOUNTER — Emergency Department (HOSPITAL_COMMUNITY): Payer: Medicare Other

## 2019-09-27 DIAGNOSIS — W108XXA Fall (on) (from) other stairs and steps, initial encounter: Secondary | ICD-10-CM | POA: Diagnosis not present

## 2019-09-27 DIAGNOSIS — Z79899 Other long term (current) drug therapy: Secondary | ICD-10-CM | POA: Insufficient documentation

## 2019-09-27 DIAGNOSIS — S065X0A Traumatic subdural hemorrhage without loss of consciousness, initial encounter: Secondary | ICD-10-CM | POA: Diagnosis not present

## 2019-09-27 DIAGNOSIS — I609 Nontraumatic subarachnoid hemorrhage, unspecified: Secondary | ICD-10-CM

## 2019-09-27 DIAGNOSIS — W19XXXA Unspecified fall, initial encounter: Secondary | ICD-10-CM

## 2019-09-27 DIAGNOSIS — Y9301 Activity, walking, marching and hiking: Secondary | ICD-10-CM | POA: Insufficient documentation

## 2019-09-27 DIAGNOSIS — F1010 Alcohol abuse, uncomplicated: Secondary | ICD-10-CM | POA: Diagnosis present

## 2019-09-27 DIAGNOSIS — Z20822 Contact with and (suspected) exposure to covid-19: Secondary | ICD-10-CM | POA: Diagnosis not present

## 2019-09-27 DIAGNOSIS — Y92007 Garden or yard of unspecified non-institutional (private) residence as the place of occurrence of the external cause: Secondary | ICD-10-CM | POA: Diagnosis not present

## 2019-09-27 DIAGNOSIS — M549 Dorsalgia, unspecified: Secondary | ICD-10-CM | POA: Diagnosis not present

## 2019-09-27 DIAGNOSIS — Y999 Unspecified external cause status: Secondary | ICD-10-CM | POA: Insufficient documentation

## 2019-09-27 DIAGNOSIS — F329 Major depressive disorder, single episode, unspecified: Secondary | ICD-10-CM | POA: Diagnosis present

## 2019-09-27 DIAGNOSIS — S0219XA Other fracture of base of skull, initial encounter for closed fracture: Secondary | ICD-10-CM | POA: Diagnosis not present

## 2019-09-27 DIAGNOSIS — I10 Essential (primary) hypertension: Secondary | ICD-10-CM | POA: Diagnosis not present

## 2019-09-27 DIAGNOSIS — S065XAA Traumatic subdural hemorrhage with loss of consciousness status unknown, initial encounter: Secondary | ICD-10-CM

## 2019-09-27 DIAGNOSIS — Z7982 Long term (current) use of aspirin: Secondary | ICD-10-CM | POA: Insufficient documentation

## 2019-09-27 DIAGNOSIS — S065X9A Traumatic subdural hemorrhage with loss of consciousness of unspecified duration, initial encounter: Secondary | ICD-10-CM

## 2019-09-27 DIAGNOSIS — N400 Enlarged prostate without lower urinary tract symptoms: Secondary | ICD-10-CM | POA: Diagnosis present

## 2019-09-27 DIAGNOSIS — Z720 Tobacco use: Secondary | ICD-10-CM | POA: Insufficient documentation

## 2019-09-27 DIAGNOSIS — F1210 Cannabis abuse, uncomplicated: Secondary | ICD-10-CM | POA: Diagnosis present

## 2019-09-27 DIAGNOSIS — G8929 Other chronic pain: Secondary | ICD-10-CM | POA: Diagnosis not present

## 2019-09-27 DIAGNOSIS — Z87891 Personal history of nicotine dependence: Secondary | ICD-10-CM | POA: Diagnosis not present

## 2019-09-27 DIAGNOSIS — Y92009 Unspecified place in unspecified non-institutional (private) residence as the place of occurrence of the external cause: Secondary | ICD-10-CM | POA: Diagnosis not present

## 2019-09-27 DIAGNOSIS — F1120 Opioid dependence, uncomplicated: Secondary | ICD-10-CM | POA: Diagnosis present

## 2019-09-27 LAB — SARS CORONAVIRUS 2 (TAT 6-24 HRS): SARS Coronavirus 2: NEGATIVE

## 2019-09-27 LAB — MAGNESIUM: Magnesium: 2.3 mg/dL (ref 1.7–2.4)

## 2019-09-27 LAB — URINALYSIS, ROUTINE W REFLEX MICROSCOPIC
Bacteria, UA: NONE SEEN
Bilirubin Urine: NEGATIVE
Glucose, UA: NEGATIVE mg/dL
Hgb urine dipstick: NEGATIVE
Ketones, ur: NEGATIVE mg/dL
Leukocytes,Ua: NEGATIVE
Nitrite: NEGATIVE
Protein, ur: 100 mg/dL — AB
Specific Gravity, Urine: 1.016 (ref 1.005–1.030)
pH: 6 (ref 5.0–8.0)

## 2019-09-27 LAB — CBC WITH DIFFERENTIAL/PLATELET
Abs Immature Granulocytes: 0.06 10*3/uL (ref 0.00–0.07)
Basophils Absolute: 0 10*3/uL (ref 0.0–0.1)
Basophils Relative: 0 %
Eosinophils Absolute: 0.2 10*3/uL (ref 0.0–0.5)
Eosinophils Relative: 2 %
HCT: 41.9 % (ref 39.0–52.0)
Hemoglobin: 14 g/dL (ref 13.0–17.0)
Immature Granulocytes: 1 %
Lymphocytes Relative: 13 %
Lymphs Abs: 1.1 10*3/uL (ref 0.7–4.0)
MCH: 31.8 pg (ref 26.0–34.0)
MCHC: 33.4 g/dL (ref 30.0–36.0)
MCV: 95.2 fL (ref 80.0–100.0)
Monocytes Absolute: 0.6 10*3/uL (ref 0.1–1.0)
Monocytes Relative: 7 %
Neutro Abs: 6.4 10*3/uL (ref 1.7–7.7)
Neutrophils Relative %: 77 %
Platelets: 199 10*3/uL (ref 150–400)
RBC: 4.4 MIL/uL (ref 4.22–5.81)
RDW: 13.2 % (ref 11.5–15.5)
WBC: 8.4 10*3/uL (ref 4.0–10.5)
nRBC: 0 % (ref 0.0–0.2)

## 2019-09-27 LAB — BASIC METABOLIC PANEL
Anion gap: 8 (ref 5–15)
BUN: 19 mg/dL (ref 8–23)
CO2: 31 mmol/L (ref 22–32)
Calcium: 9.2 mg/dL (ref 8.9–10.3)
Chloride: 100 mmol/L (ref 98–111)
Creatinine, Ser: 1.11 mg/dL (ref 0.61–1.24)
GFR calc Af Amer: >60 mL/min (ref >60)
GFR calc non Af Amer: >60 mL/min (ref >60)
Glucose, Bld: 121 mg/dL — ABNORMAL HIGH (ref 70–99)
Potassium: 3.9 mmol/L (ref 3.5–5.1)
Sodium: 139 mmol/L (ref 135–145)

## 2019-09-27 LAB — RAPID URINE DRUG SCREEN, HOSP PERFORMED
Amphetamines: POSITIVE — AB
Barbiturates: NOT DETECTED
Benzodiazepines: POSITIVE — AB
Cocaine: NOT DETECTED
Opiates: NOT DETECTED
Tetrahydrocannabinol: POSITIVE — AB

## 2019-09-27 LAB — PHOSPHORUS: Phosphorus: 3.1 mg/dL (ref 2.5–4.6)

## 2019-09-27 LAB — PROTIME-INR
INR: 1.1 (ref 0.8–1.2)
Prothrombin Time: 13.7 seconds (ref 11.4–15.2)

## 2019-09-27 MED ORDER — MORPHINE SULFATE (PF) 2 MG/ML IV SOLN: 2.0000 mg | INTRAVENOUS | Status: DC | PRN

## 2019-09-27 MED ORDER — ACETAMINOPHEN 650 MG RE SUPP: 650.0000 mg | Freq: Four times a day (QID) | RECTAL | Status: DC | PRN

## 2019-09-27 MED ORDER — ONDANSETRON HCL 4 MG PO TABS: 4.0000 mg | ORAL_TABLET | Freq: Four times a day (QID) | ORAL | Status: DC | PRN

## 2019-09-27 MED ORDER — FENTANYL CITRATE (PF) 100 MCG/2ML IJ SOLN
50.0000 ug | Freq: Once | INTRAMUSCULAR | Status: AC
  Administered 2019-09-27: 50 ug via INTRAVENOUS
  Filled 2019-09-27: qty 2

## 2019-09-27 MED ORDER — SERTRALINE HCL 100 MG PO TABS
100.0000 mg | ORAL_TABLET | Freq: Every day | ORAL | Status: DC
  Administered 2019-09-27 – 2019-09-28 (×2): 100 mg via ORAL
  Filled 2019-09-27 (×2): qty 1

## 2019-09-27 MED ORDER — SENNOSIDES-DOCUSATE SODIUM 8.6-50 MG PO TABS
1.0000 | ORAL_TABLET | Freq: Every day | ORAL | Status: DC | PRN
  Administered 2019-09-28: 1 via ORAL
  Filled 2019-09-27: qty 1

## 2019-09-27 MED ORDER — AMPHETAMINE-DEXTROAMPHET ER 10 MG PO CP24
30.0000 mg | ORAL_CAPSULE | Freq: Every day | ORAL | Status: DC
  Administered 2019-09-28: 30 mg via ORAL
  Filled 2019-09-27 (×2): qty 3

## 2019-09-27 MED ORDER — MONTELUKAST SODIUM 10 MG PO TABS
10.0000 mg | ORAL_TABLET | Freq: Every day | ORAL | Status: DC
  Administered 2019-09-27 – 2019-09-28 (×2): 10 mg via ORAL
  Filled 2019-09-27 (×2): qty 1

## 2019-09-27 MED ORDER — OXYCODONE HCL 5 MG PO TABS
5.0000 mg | ORAL_TABLET | ORAL | Status: DC | PRN
  Administered 2019-09-27 – 2019-09-28 (×3): 5 mg via ORAL
  Filled 2019-09-27 (×3): qty 1

## 2019-09-27 MED ORDER — FUROSEMIDE 40 MG PO TABS
40.0000 mg | ORAL_TABLET | Freq: Every day | ORAL | Status: DC
  Administered 2019-09-27 – 2019-09-28 (×2): 40 mg via ORAL
  Filled 2019-09-27: qty 1
  Filled 2019-09-27: qty 2

## 2019-09-27 MED ORDER — ONDANSETRON HCL 4 MG/2ML IJ SOLN
4.0000 mg | Freq: Once | INTRAMUSCULAR | Status: AC
  Administered 2019-09-27: 4 mg via INTRAVENOUS
  Filled 2019-09-27: qty 2

## 2019-09-27 MED ORDER — ACETAMINOPHEN 325 MG PO TABS
650.0000 mg | ORAL_TABLET | Freq: Four times a day (QID) | ORAL | Status: DC | PRN
  Administered 2019-09-27 – 2019-09-28 (×3): 650 mg via ORAL
  Filled 2019-09-27 (×3): qty 2

## 2019-09-27 MED ORDER — DOXAZOSIN MESYLATE 8 MG PO TABS
8.0000 mg | ORAL_TABLET | Freq: Every day | ORAL | Status: DC
  Administered 2019-09-27: 8 mg via ORAL
  Filled 2019-09-27 (×2): qty 1

## 2019-09-27 MED ORDER — LEVETIRACETAM 500 MG PO TABS
500.0000 mg | ORAL_TABLET | Freq: Two times a day (BID) | ORAL | Status: DC
  Administered 2019-09-27 – 2019-09-28 (×3): 500 mg via ORAL
  Filled 2019-09-27 (×3): qty 1

## 2019-09-27 MED ORDER — ONDANSETRON HCL 4 MG/2ML IJ SOLN: 4.0000 mg | Freq: Four times a day (QID) | INTRAMUSCULAR | Status: DC | PRN

## 2019-09-27 MED ORDER — AMLODIPINE BESYLATE 10 MG PO TABS
10.0000 mg | ORAL_TABLET | Freq: Every day | ORAL | Status: DC
  Administered 2019-09-27 – 2019-09-28 (×2): 10 mg via ORAL
  Filled 2019-09-27: qty 2
  Filled 2019-09-27: qty 1

## 2019-09-27 NOTE — ED Notes (Signed)
Neuro at bedside.

## 2019-09-27 NOTE — H&P (Signed)
History and Physical    David Carroll ZOX:[id number] DOB: [birth year] DOA: 09/27/2019  PCP: Vivien Presto, MD  Patient coming from: Home  I have personally briefly reviewed patient's old medical records in [facility]  Chief Complaint: Fall  HPI: David Carroll is a 79 y.o. male with medical history significant of hypertension, major depression, generalized anxiety, BPH, chronic opioid dependence, chronic back pain status post lumbar fusion in December 2020 with Dr. Petra Kuba, history of recurrent falls, alcohol and marijuana abuse presents to emergency department after an unwitnessed fall.  Patient tells me that he was in the backyard this morning and he fell backwards from a standing position and hit on his head.  Patient does not remember the event.  He tells me that he had no dizziness, lightheadedness, blurry vision, headache prior to the fall.  Has recurrent falls over the last several months which he attributes to recent lumbar fusion.  He is scheduled for outpatient brain MRI this week.  He is not on any anticoagulation.  He takes ibuprofen for his back pain.  Takes Xanax, Valium as needed at bedtime, Zoloft for major depression/anxiety.  He tells me that he takes Adderall for his depression?.  Takes Lasix for his bilateral lower extremity edema.  No history of seizure, loss of consciousness, chest pain, shortness of breath, fever, chills, cough, congestion, urinary or bowel changes.  He lives with his wife at home.  No history of smoking however drinks vodka every night and uses marijuana every day.  ED Course: Upon arrival to ED: Patient's blood pressure was 178/97, afebrile, initial labs such as CBC, BMP, UA, INR: WNL.  COVID-19 pending.  CT head, cervical spine, maxillofacial obtained which came back positive for nondisplaced to minimally displaced fracture within the right temporal bone, associated fluid/blood products throughout the mastoid air cells.  The temporal bone  fracture extends to the posterior wall of the external auditory canal and there is associated fluid/blood products within the EAC.  The right temporal bone fracture also extends posteriorly there is centrally to the right occipital bone where it is nondisplaced.  #2 thin acute subdural hematoma overlying the upper left occipital lobe, measuring 6 mm thickness.  No associated mass-effect or midline shift.  #3 small amount of traumatic subarachnoid and or parenchymal hemorrhage within the lower aspects of the bilateral frontal lobes and right temporal lobe with associated parenchymal edema within the left frontal lobe.  No associated mass-effect or midline shift. EDP consulted neurosurgery.  [facility] hospitalist consulted for admission for medical management.  Review of Systems: As per HPI otherwise negative.    Past Medical History:  Diagnosis Date  . Arthritis    KNEES  . BPH (benign prostatic hypertrophy)   . Complication of anesthesia SLOW RECOVERING FROM ANES. DRUGS   HX OPIATE ADDICTION-- PT ON SUBOXONE  . Depression   . Glaucoma   . History of gastric ulcer   . History of gastroesophageal reflux (GERD)   . History of kidney stones   . Hydronephrosis, left   . Hypertension   . Nephrolithiasis   . Opiate addiction (HCC)   . Urgency of urination     Past Surgical History:  Procedure Laterality Date  . CATARACT EXTRACTION W/ INTRAOCULAR LENS  IMPLANT, BILATERAL    . CYSTOSCOPY WITH BIOPSY Left 11/06/2012   Procedure: CYSTOSCOPY WITH BIOPSY;  Surgeon: Sebastian Ache, MD;  Location: [facility];  Service: Urology;  Laterality: Left;  . EXTRACORPOREAL SHOCK WAVE LITHOTRIPSY Left  03-06-2011  . HOLMIUM LASER APPLICATION Left 11/06/2012   Procedure: HOLMIUM LASER APPLICATION;  Surgeon: Sebastian Ache, MD;  Location: [facility];  Service: Urology;  Laterality: Left;  . INGUINAL HERNIA REPAIR Bilateral 1970   RE-DO LEFT 1985  . LUMBAR DISC SURGERY  09-24-2008     L3 -- L4  . LUMBAR LAMINECTOMY  05/15/2019   with partial facet joint removal  . MEDIAL COMPARTMENT ARTHROPLASTY, LEFT KNEE  02-22-2010  . REMOVAL BENIGN MASS LEFT LUNG  1977  . VAGOTOMY  1986   ULCERS     reports that he quit smoking about 12 years ago. His smoking use included cigarettes. He has a 80.00 pack-year smoking history. He has never used smokeless tobacco. He reports current alcohol use of about 7.0 standard drinks of alcohol per week. He reports that he does not use drugs.  No Known Allergies  Family History  Problem Relation Age of Onset  . Depression Mother     Prior to Admission medications   Medication Sig Start Date End Date Taking? Authorizing Provider  ALPRAZolam Prudy Feeler) 1 MG tablet Take 1 mg by mouth 3 (three) times daily as needed. 09/17/19  Yes [provider]  amLODipine (NORVASC) 10 MG tablet Take 10 mg by mouth daily.   Yes [provider]  amphetamine-dextroamphetamine (ADDERALL) 15 MG tablet Take 30 mg by mouth daily. 30 mg ER   Yes [provider]  carisoprodol (SOMA) 350 MG tablet Take 350 mg by mouth 3 (three) times daily as needed. 08/14/19  Yes [provider]  chlorpheniramine (CHLOR-TRIMETON) 4 MG tablet Take 4 mg by mouth 2 (two) times daily as needed for allergies.   Yes [provider]  Cholecalciferol (VITAMIN D) 2000 UNITS tablet Take 2,000 Units by mouth daily.   Yes [provider]  co-enzyme Q-10 30 MG capsule Take 30 mg by mouth at bedtime.    Yes [provider]  diazepam (VALIUM) 10 MG tablet Take 10 mg by mouth every 6 (six) hours as needed. 09/08/19  Yes [provider]  doxazosin (CARDURA XL) 8 MG 24 hr tablet Take 8 mg by mouth at bedtime.    Yes [provider]  furosemide (LASIX) 40 MG tablet Take 40 mg by mouth daily. 08/24/19  Yes [provider]  ibuprofen (ADVIL,MOTRIN) 800 MG tablet Take 600 mg by mouth every 6 (six) hours as needed for moderate  pain (pain).    Yes [provider]  montelukast (SINGULAIR) 10 MG tablet Take 10 mg by mouth daily. 09/24/19  Yes [provider]  psyllium (METAMUCIL) 58.6 % powder Take 1 packet by mouth daily.   Yes [provider]  sennosides-docusate sodium (SENOKOT-S) 8.6-50 MG tablet Take 1 tablet by mouth 2 (two) times daily. While taking pain meds to prevent constipation. Patient taking differently: Take 1 tablet by mouth daily as needed for constipation (constipation). While taking pain meds to prevent constipation. 11/06/12  Yes Sebastian Ache, MD  sertraline (ZOLOFT) 100 MG tablet Take 100 mg by mouth daily.    Yes [provider]  oxybutynin (DITROPAN) 5 MG tablet Take 1 tablet (5 mg total) by mouth every 8 (eight) hours as needed. For bladder spasms / stent colic Patient not taking: Reported on 06/18/2019 11/06/12   Sebastian Ache, MD  oxyCODONE-acetaminophen (PERCOCET) 5-325 MG per tablet Take 1-2 tablets by mouth every 6 (six) hours as needed. Patient not taking: Reported on 06/18/2019 07/13/14   Charlynne Pander,  MD    Physical Exam: Vitals:   09/27/19 1018 09/27/19 1022 09/27/19 1100 09/27/19 1300  BP: (!) 170/96  (!) 159/91 (!) 178/97  Pulse: 66  66 67  Resp: 16  17 17   Temp: 97.6 F (36.4 C)     TempSrc: Oral     SpO2: 93%  94% 97%  Weight:  93.4 kg    Height:  5' 8.5" (1.74 m)      Constitutional: NAD, calm, comfortable, on RA, communicating well Eyes: PERRL, lids and conjunctivae normal ENMT: Mucous membranes are moist. Posterior pharynx clear of any exudate or lesions.Normal dentition.  Bright red blood noted in right ear. Neck: normal, supple, no masses, no thyromegaly Respiratory: clear to auscultation bilaterally, no wheezing, no crackles. Normal respiratory effort. No accessory muscle use.  Cardiovascular: Regular rate and rhythm, no murmurs / rubs / gallops. No extremity edema. 2+ pedal pulses. No carotid bruits.  Abdomen: no tenderness, no  masses palpated. No hepatosplenomegaly. Bowel sounds positive.  Musculoskeletal: no clubbing / cyanosis. No joint deformity upper and lower extremities. Good ROM, no contractures. Normal muscle tone.  Skin: no rashes, lesions, ulcers. No induration Neurologic: CN 2-12 grossly intact. Sensation intact, DTR normal. Strength 5/5 in all 4.  Psychiatric: Normal judgment and insight. Alert and oriented x 3. Normal mood.    Labs on Admission: I have personally reviewed following labs and imaging studies  CBC: Recent Labs  Lab 09/27/19 1257  WBC 8.4  NEUTROABS 6.4  HGB 14.0  HCT 41.9  MCV 95.2  PLT 199   Basic Metabolic Panel: Recent Labs  Lab 09/27/19 1257  NA 139  K 3.9  CL 100  CO2 31  GLUCOSE 121*  BUN 19  CREATININE 1.11  CALCIUM 9.2   GFR: Estimated Creatinine Clearance: 59.4 mL/min (by C-G formula based on SCr of 1.11 mg/dL). Liver Function Tests: No results for input(s): AST, ALT, ALKPHOS, BILITOT, PROT, ALBUMIN in the last 168 hours. No results for input(s): LIPASE, AMYLASE in the last 168 hours. No results for input(s): AMMONIA in the last 168 hours. Coagulation Profile: Recent Labs  Lab 09/27/19 1257  INR 1.1   Cardiac Enzymes: No results for input(s): CKTOTAL, CKMB, CKMBINDEX, TROPONINI in the last 168 hours. BNP (last 3 results) No results for input(s): PROBNP in the last 8760 hours. HbA1C: No results for input(s): HGBA1C in the last 72 hours. CBG: No results for input(s): GLUCAP in the last 168 hours. Lipid Profile: No results for input(s): CHOL, HDL, LDLCALC, TRIG, CHOLHDL, LDLDIRECT in the last 72 hours. Thyroid Function Tests: No results for input(s): TSH, T4TOTAL, FREET4, T3FREE, THYROIDAB in the last 72 hours. Anemia Panel: No results for input(s): VITAMINB12, FOLATE, FERRITIN, TIBC, IRON, RETICCTPCT in the last 72 hours. Urine analysis:    Component Value Date/Time   COLORURINE YELLOW 09/27/2019 1258   APPEARANCEUR CLEAR 09/27/2019 1258    LABSPEC 1.016 09/27/2019 1258   PHURINE 6.0 09/27/2019 1258   GLUCOSEU NEGATIVE 09/27/2019 1258   HGBUR NEGATIVE 09/27/2019 1258   BILIRUBINUR NEGATIVE 09/27/2019 1258   KETONESUR NEGATIVE 09/27/2019 1258   PROTEINUR 100 (A) 09/27/2019 1258   UROBILINOGEN 0.2 10/15/2012 0119   NITRITE NEGATIVE 09/27/2019 1258   LEUKOCYTESUR NEGATIVE 09/27/2019 1258    Radiological Exams on Admission: CT Head Wo Contrast  Result Date: 09/27/2019 CLINICAL DATA:  Head trauma. Head and neck pain. EXAM: CT HEAD WITHOUT CONTRAST CT MAXILLOFACIAL WITHOUT CONTRAST CT CERVICAL SPINE WITHOUT CONTRAST TECHNIQUE: Multidetector CT imaging of the head,  cervical spine, and maxillofacial structures were performed using the standard protocol without intravenous contrast. Multiplanar CT image reconstructions of the cervical spine and maxillofacial structures were also generated. COMPARISON:  None. FINDINGS: CT HEAD FINDINGS Brain: Mild generalized age related parenchymal atrophy with commensurate dilatation of the ventricles and sulci. Small amount of subarachnoid and/or parenchymal hemorrhage within the lower aspects of the LEFT frontal lobe, presumably posttraumatic, with surrounding parenchymal edema. Additional smaller foci of subarachnoid hemorrhage overlying the RIGHT frontal lobe and RIGHT temporal lobe. No associated mass effect or midline shift seen. Thin acute subdural hematoma overlies the upper LEFT occipital lobe, measuring 6 mm thickness. No associated mass effect or midline shift. Vascular: Aortic atherosclerosis. Skull: Nondisplaced to minimally displaced fracture within the RIGHT temporal bone (series 4, image 19), with associated fluid/blood products throughout the mastoid air cells. Associated small foci of pneumocephalus underlying the RIGHT temporal bone/mastoid. The RIGHT temporal bone fracture extends centrally to the RIGHT occipital bone where it is nondisplaced. Associated blood products versus unrelated  cerumen in the RIGHT external auditory canal. Other: None. CT MAXILLOFACIAL FINDINGS Osseous: Lower frontal bones are intact and normally aligned. Node displaced nasal bone fracture. Osseous structures about the orbits are intact and normally aligned bilaterally. Walls of the maxillary sinuses appear intact and normally aligned. Bilateral zygomatic arches and pterygoid plates are intact. No mandible fracture or displacement is seen. Nondisplaced minimally displaced fracture of the LEFT temporal bone (series 10, images 10 through 18, with extension to the LEFT occipital bone. Associated fluid/blood products within the mastoid air cells and external auditory canal. Orbits: Negative. No traumatic or inflammatory finding. Sinuses: Small amount of layering fluid and/or mucosal thickening in the maxillary sinuses. Soft tissues: No circumscribed soft tissue hematoma. CT CERVICAL SPINE FINDINGS Alignment: No evidence of acute vertebral body subluxation. Skull base and vertebrae: No fracture line or displaced fracture fragment is seen. Soft tissues and spinal canal: No prevertebral fluid or swelling. No visible canal hematoma. Disc levels: Degenerative spondylosis, mild to moderate in degree. No more than mild central canal stenosis at any level. Upper chest: No acute findings. Chronic pleuroparenchymal scarring/fibrosis at the lung apices. Other: Bilateral carotid atherosclerosis. IMPRESSION: 1. Nondisplaced to minimally displaced fracture within the RIGHT temporal bone, with associated fluid/blood products throughout the mastoid air cells. The temporal bone fracture extends to the posterior wall of the external auditory canal and there is associated fluid/blood products within the EAC. The RIGHT temporal bone fracture also extends posteriorly-centrally to the RIGHT occipital bone where it is nondisplaced. Associated small foci of pneumocephalus underlying the RIGHT temporal bone/mastoid. 2. No additional facial bone  fracture identified. 3. Thin acute subdural hematoma overlying the upper LEFT occipital lobe, measuring 6 mm thickness. No associated mass effect or midline shift. 4. Small amount of traumatic subarachnoid and/or parenchymal hemorrhage within the lower aspects of the bilateral frontal lobes and RIGHT temporal lobe, with associated parenchymal edema within the LEFT frontal lobe. No associated mass effect or midline shift. 5. No fracture or acute subluxation within the cervical spine. Degenerative spondylosis of the cervical spine, mild to moderate in degree. Aortic Atherosclerosis (ICD10-I70.0). Critical Value/emergent results were called by telephone at the time of interpretation on 09/27/2019 at 12:05 pm to provider [facility] , who verbally acknowledged these results. Electronically Signed   By: Bary Richard M.D.   On: 09/27/2019 12:09   CT Cervical Spine Wo Contrast  Result Date: 09/27/2019 CLINICAL DATA:  Head trauma. Head and neck pain. EXAM: CT HEAD WITHOUT  CONTRAST CT MAXILLOFACIAL WITHOUT CONTRAST CT CERVICAL SPINE WITHOUT CONTRAST TECHNIQUE: Multidetector CT imaging of the head, cervical spine, and maxillofacial structures were performed using the standard protocol without intravenous contrast. Multiplanar CT image reconstructions of the cervical spine and maxillofacial structures were also generated. COMPARISON:  None. FINDINGS: CT HEAD FINDINGS Brain: Mild generalized age related parenchymal atrophy with commensurate dilatation of the ventricles and sulci. Small amount of subarachnoid and/or parenchymal hemorrhage within the lower aspects of the LEFT frontal lobe, presumably posttraumatic, with surrounding parenchymal edema. Additional smaller foci of subarachnoid hemorrhage overlying the RIGHT frontal lobe and RIGHT temporal lobe. No associated mass effect or midline shift seen. Thin acute subdural hematoma overlies the upper LEFT occipital lobe, measuring 6 mm thickness. No associated mass effect  or midline shift. Vascular: Aortic atherosclerosis. Skull: Nondisplaced to minimally displaced fracture within the RIGHT temporal bone (series 4, image 19), with associated fluid/blood products throughout the mastoid air cells. Associated small foci of pneumocephalus underlying the RIGHT temporal bone/mastoid. The RIGHT temporal bone fracture extends centrally to the RIGHT occipital bone where it is nondisplaced. Associated blood products versus unrelated cerumen in the RIGHT external auditory canal. Other: None. CT MAXILLOFACIAL FINDINGS Osseous: Lower frontal bones are intact and normally aligned. Node displaced nasal bone fracture. Osseous structures about the orbits are intact and normally aligned bilaterally. Walls of the maxillary sinuses appear intact and normally aligned. Bilateral zygomatic arches and pterygoid plates are intact. No mandible fracture or displacement is seen. Nondisplaced minimally displaced fracture of the LEFT temporal bone (series 10, images 10 through 18, with extension to the LEFT occipital bone. Associated fluid/blood products within the mastoid air cells and external auditory canal. Orbits: Negative. No traumatic or inflammatory finding. Sinuses: Small amount of layering fluid and/or mucosal thickening in the maxillary sinuses. Soft tissues: No circumscribed soft tissue hematoma. CT CERVICAL SPINE FINDINGS Alignment: No evidence of acute vertebral body subluxation. Skull base and vertebrae: No fracture line or displaced fracture fragment is seen. Soft tissues and spinal canal: No prevertebral fluid or swelling. No visible canal hematoma. Disc levels: Degenerative spondylosis, mild to moderate in degree. No more than mild central canal stenosis at any level. Upper chest: No acute findings. Chronic pleuroparenchymal scarring/fibrosis at the lung apices. Other: Bilateral carotid atherosclerosis. IMPRESSION: 1. Nondisplaced to minimally displaced fracture within the RIGHT temporal bone,  with associated fluid/blood products throughout the mastoid air cells. The temporal bone fracture extends to the posterior wall of the external auditory canal and there is associated fluid/blood products within the EAC. The RIGHT temporal bone fracture also extends posteriorly-centrally to the RIGHT occipital bone where it is nondisplaced. Associated small foci of pneumocephalus underlying the RIGHT temporal bone/mastoid. 2. No additional facial bone fracture identified. 3. Thin acute subdural hematoma overlying the upper LEFT occipital lobe, measuring 6 mm thickness. No associated mass effect or midline shift. 4. Small amount of traumatic subarachnoid and/or parenchymal hemorrhage within the lower aspects of the bilateral frontal lobes and RIGHT temporal lobe, with associated parenchymal edema within the LEFT frontal lobe. No associated mass effect or midline shift. 5. No fracture or acute subluxation within the cervical spine. Degenerative spondylosis of the cervical spine, mild to moderate in degree. Aortic Atherosclerosis (ICD10-I70.0). Critical Value/emergent results were called by telephone at the time of interpretation on 09/27/2019 at 12:05 pm to provider [facility] , who verbally acknowledged these results. Electronically Signed   By: Franki Cabot M.D.   On: 09/27/2019 12:09   CT Maxillofacial Wo Contrast  Result Date: 09/27/2019 CLINICAL DATA:  Head trauma. Head and neck pain. EXAM: CT HEAD WITHOUT CONTRAST CT MAXILLOFACIAL WITHOUT CONTRAST CT CERVICAL SPINE WITHOUT CONTRAST TECHNIQUE: Multidetector CT imaging of the head, cervical spine, and maxillofacial structures were performed using the standard protocol without intravenous contrast. Multiplanar CT image reconstructions of the cervical spine and maxillofacial structures were also generated. COMPARISON:  None. FINDINGS: CT HEAD FINDINGS Brain: Mild generalized age related parenchymal atrophy with commensurate dilatation of the ventricles and  sulci. Small amount of subarachnoid and/or parenchymal hemorrhage within the lower aspects of the LEFT frontal lobe, presumably posttraumatic, with surrounding parenchymal edema. Additional smaller foci of subarachnoid hemorrhage overlying the RIGHT frontal lobe and RIGHT temporal lobe. No associated mass effect or midline shift seen. Thin acute subdural hematoma overlies the upper LEFT occipital lobe, measuring 6 mm thickness. No associated mass effect or midline shift. Vascular: Aortic atherosclerosis. Skull: Nondisplaced to minimally displaced fracture within the RIGHT temporal bone (series 4, image 19), with associated fluid/blood products throughout the mastoid air cells. Associated small foci of pneumocephalus underlying the RIGHT temporal bone/mastoid. The RIGHT temporal bone fracture extends centrally to the RIGHT occipital bone where it is nondisplaced. Associated blood products versus unrelated cerumen in the RIGHT external auditory canal. Other: None. CT MAXILLOFACIAL FINDINGS Osseous: Lower frontal bones are intact and normally aligned. Node displaced nasal bone fracture. Osseous structures about the orbits are intact and normally aligned bilaterally. Walls of the maxillary sinuses appear intact and normally aligned. Bilateral zygomatic arches and pterygoid plates are intact. No mandible fracture or displacement is seen. Nondisplaced minimally displaced fracture of the LEFT temporal bone (series 10, images 10 through 18, with extension to the LEFT occipital bone. Associated fluid/blood products within the mastoid air cells and external auditory canal. Orbits: Negative. No traumatic or inflammatory finding. Sinuses: Small amount of layering fluid and/or mucosal thickening in the maxillary sinuses. Soft tissues: No circumscribed soft tissue hematoma. CT CERVICAL SPINE FINDINGS Alignment: No evidence of acute vertebral body subluxation. Skull base and vertebrae: No fracture line or displaced fracture  fragment is seen. Soft tissues and spinal canal: No prevertebral fluid or swelling. No visible canal hematoma. Disc levels: Degenerative spondylosis, mild to moderate in degree. No more than mild central canal stenosis at any level. Upper chest: No acute findings. Chronic pleuroparenchymal scarring/fibrosis at the lung apices. Other: Bilateral carotid atherosclerosis. IMPRESSION: 1. Nondisplaced to minimally displaced fracture within the RIGHT temporal bone, with associated fluid/blood products throughout the mastoid air cells. The temporal bone fracture extends to the posterior wall of the external auditory canal and there is associated fluid/blood products within the EAC. The RIGHT temporal bone fracture also extends posteriorly-centrally to the RIGHT occipital bone where it is nondisplaced. Associated small foci of pneumocephalus underlying the RIGHT temporal bone/mastoid. 2. No additional facial bone fracture identified. 3. Thin acute subdural hematoma overlying the upper LEFT occipital lobe, measuring 6 mm thickness. No associated mass effect or midline shift. 4. Small amount of traumatic subarachnoid and/or parenchymal hemorrhage within the lower aspects of the bilateral frontal lobes and RIGHT temporal lobe, with associated parenchymal edema within the LEFT frontal lobe. No associated mass effect or midline shift. 5. No fracture or acute subluxation within the cervical spine. Degenerative spondylosis of the cervical spine, mild to moderate in degree. Aortic Atherosclerosis (ICD10-I70.0). Critical Value/emergent results were called by telephone at the time of interpretation on 09/27/2019 at 12:05 pm to provider [facility] , who verbally acknowledged these results. Electronically Signed   By:  Bary Richard M.D.   On: 09/27/2019 12:09    EKG: Independently reviewed.  Normal sinus rhythm, LVH, no ST elevation or depression noted.  Assessment/Plan Principal Problem:   Fall at home, initial  encounter Active Problems:   MDD (major depressive disorder)   Opiate dependence (HCC)   Hypertension   Benign prostatic hyperplasia   Chronic back pain   Temporal bone fracture (HCC)   SDH (subdural hematoma) (HCC)   Fall   Marijuana abuse   Alcohol abuse   Temporal bone fracture extends to posterior wall of external auditory canal/ acute subdural hematoma/small subarachnoid hemorrhage: -S/P Fall -Patient has history of recurrent falls-could be due to polypharmacy (patient takes Xanax, Valium, doxazosin, Lasix) versus alcohol abuse versus chronic back pain -Reviewed CT head, cervical spine, maxillofacial  -Initial labs such as CBC, BMP, UA, INR: Within normal limits, COVID-19 pending. -Patient evaluated by neurosurgery in ED-recommended no intervention at this time. -Repeat CT head without contrast tomorrow AM. -Start on Keppra 500 mg p.o. twice daily for 7 days for seizure precautions -Tylenol/oxycodone/morphine for mild/moderate/severe pain respectively. -Currently H&H is stable.  Monitor H&H closely.  Transfuse as needed. -Consulted PT/ST -Neurochecks every 4 hour -On fall and seizure precautions -Monitor vitals closely  Hypertension: Blood pressure elevated upon arrival -Continue amlodipine -Monitor blood pressure closely.  Major depression/generalized anxiety: -Continue Zoloft, hold Xanax and Valium  Chronic back pain: Status post lumbar fusion in December 2020. -Pain control -Consulted PT  Chronic bilateral lower extremity edema -Continue Lasix.  BPH: Continue doxazosin  Alcohol abuse: -Patient drinks vodka every night. -On seizure precautions.  Start on CIWA protocol  Marijuana abuse: Check UDS -Counseled about cessation  DVT prophylaxis: SCD/TED, no chemical anticoagulation as patient is actively bleeding from right ear Code Status: Full code-confirmed with patient and his wife at bedside  family Communication: Patient's wife present at bedside.  Plan of  care discussed with patient and his wife at bedside in length and they verbalized understanding and agreed with it. Disposition Plan: Likely home in 2 to 3 days Consults called: Neurosurgery by EDP Admission status: Inpatient  Ollen Bowl MD [facility] Pager [phone]  If 7PM-7AM, please contact night-coverage [email or web address] Password [facility]  09/27/2019, 2:48 PM

## 2019-09-27 NOTE — Progress Notes (Signed)
Pt refused his Adderall medication tonight, said he takes it during the day time, however, medication was already opened to be given, pharmacy called to determine what to do with medication, advised to waste it since it cannot be stored, medication wasted into the stericycle container in the med room, witnessed by the [employer]. Obasogie-Asidi, Fardeen Steinberger Efe

## 2019-09-27 NOTE — ED Provider Notes (Signed)
[facility] EMERGENCY DEPARTMENT Provider Note   CSN: [id number] Arrival date & time: 09/27/19  1007     History Chief Complaint  Patient presents with  . Fall  . Head Injury    David Carroll is a 79 y.o. male.  Patient presents from home with complaint of head injury after a fall occurring this morning.  Patient thinks that he slipped on a stair on his back patio causing her to fall hitting the back of his head.  Wife at bedside corroborates the story.  Patient lives at home with his wife.  Patient had bleeding from his right ear canal with decreased hearing.  He does not member details of falling.  He is unsure if he felt dizzy or had any pain or shortness of breath prior to falling.  He takes aspirin, no other anticoagulation.  No weakness, numbness, or tingling in his arms or his legs.  He denies chest pain or shortness of breath, abdominal pain, vomiting, diarrhea at the current time.  No vision changes.         Past Medical History:  Diagnosis Date  . Arthritis    KNEES  . BPH (benign prostatic hypertrophy)   . Complication of anesthesia SLOW RECOVERING FROM ANES. DRUGS   HX OPIATE ADDICTION-- PT ON SUBOXONE  . Depression   . Glaucoma   . History of gastric ulcer   . History of gastroesophageal reflux (GERD)   . History of kidney stones   . Hydronephrosis, left   . Hypertension   . Nephrolithiasis   . Opiate addiction (HCC)   . Urgency of urination     Patient Active Problem List   Diagnosis Date Noted  . MDD (major depressive disorder) 09/18/2013  . Opiate dependence (HCC) 09/18/2013    Past Surgical History:  Procedure Laterality Date  . CATARACT EXTRACTION W/ INTRAOCULAR LENS  IMPLANT, BILATERAL    . CYSTOSCOPY WITH BIOPSY Left 11/06/2012   Procedure: CYSTOSCOPY WITH BIOPSY;  Surgeon: Sebastian Ache, MD;  Location: [facility];  Service: Urology;  Laterality: Left;  . EXTRACORPOREAL SHOCK WAVE LITHOTRIPSY Left 03-06-2011    . HOLMIUM LASER APPLICATION Left 11/06/2012   Procedure: HOLMIUM LASER APPLICATION;  Surgeon: Sebastian Ache, MD;  Location: [facility];  Service: Urology;  Laterality: Left;  . INGUINAL HERNIA REPAIR Bilateral 1970   RE-DO LEFT 1985  . LUMBAR DISC SURGERY  09-24-2008   L3 -- L4  . LUMBAR LAMINECTOMY  05/15/2019   with partial facet joint removal  . MEDIAL COMPARTMENT ARTHROPLASTY, LEFT KNEE  02-22-2010  . REMOVAL BENIGN MASS LEFT LUNG  1977  . VAGOTOMY  1986   ULCERS       Family History  Problem Relation Age of Onset  . Depression Mother     Social History   Tobacco Use  . Smoking status: Former Smoker    Packs/day: 2.00    Years: 40.00    Pack years: 80.00    Types: Cigarettes    Quit date: 10/31/2006    Years since quitting: 12.9  . Smokeless tobacco: Never Used  Substance Use Topics  . Alcohol use: Yes    Alcohol/week: 7.0 standard drinks    Types: 7 Cans of beer per week  . Drug use: No    Comment: hx opiate addiction in past, stopped Suboxone 4 days ago    Home Medications Prior to Admission medications   Medication Sig Start Date End Date Taking? Authorizing Provider  acidophilus (RISAQUAD) CAPS Take 1 capsule by mouth daily.    [provider]  amLODipine (NORVASC) 10 MG tablet Take 10 mg by mouth daily.    [provider]  amLODipine (NORVASC) 5 MG tablet Take 5 mg by mouth every morning.     [provider]  amphetamine-dextroamphetamine (ADDERALL) 15 MG tablet Take 15 mg by mouth 2 (two) times daily. 30 mg ER    [provider]  aspirin EC 81 MG tablet Take 81 mg by mouth every evening.     [provider]  buPROPion (WELLBUTRIN XL) 300 MG 24 hr tablet Take 300 mg by mouth at bedtime.    [provider]  Cholecalciferol (VITAMIN D) 2000 UNITS tablet Take 2,000 Units by mouth daily.    [provider]  co-enzyme Q-10 30 MG capsule Take 30 mg by mouth at bedtime.     [provider]  doxazosin (CARDURA XL) 8 MG 24 hr tablet Take 8 mg by mouth at bedtime.     [provider]  gabapentin (NEURONTIN) 300 MG capsule Take 300 mg by mouth 2 (two) times daily.    [provider]  gabapentin (NEURONTIN) 400 MG capsule Take 400 mg by mouth 3 (three) times daily.    [provider]  ibuprofen (ADVIL,MOTRIN) 800 MG tablet Take 800 mg by mouth daily as needed for moderate pain (pain).    [provider]  losartan (COZAAR) 50 MG tablet Take 50 mg by mouth daily.    [provider]  oxybutynin (DITROPAN) 5 MG tablet Take 1 tablet (5 mg total) by mouth every 8 (eight) hours as needed. For bladder spasms / stent colic Patient not taking: Reported on 06/18/2019 11/06/12   Sebastian AcheManny, Theodore, MD  oxyCODONE-acetaminophen (PERCOCET) 5-325 MG per tablet Take 1-2 tablets by mouth every 6 (six) hours as needed. Patient not taking: Reported on 06/18/2019 07/13/14   Charlynne PanderYao, David Hsienta, MD  psyllium (METAMUCIL) 58.6 % powder Take 1 packet by mouth daily.    [provider]  sennosides-docusate sodium (SENOKOT-S) 8.6-50 MG tablet Take 1 tablet by mouth 2 (two) times daily. While taking pain meds to prevent constipation. Patient taking differently: Take 1 tablet by mouth daily as needed for constipation (constipation). While taking pain meds to prevent constipation. 11/06/12   Sebastian AcheManny, Theodore, MD  sertraline (ZOLOFT) 100 MG tablet Take 100 mg by mouth at bedtime.    [provider]    Allergies    Patient has no known allergies.  Review of Systems   Review of Systems  Constitutional: Negative for fatigue.  HENT: Positive for ear discharge (blood), ear pain and hearing loss. Negative for congestion, rhinorrhea and tinnitus.   Eyes: Negative for photophobia, pain and visual disturbance.  Respiratory: Negative for shortness of breath.   Cardiovascular: Negative for chest pain.  Gastrointestinal: Negative for nausea and vomiting.    Musculoskeletal: Negative for back pain, gait problem and neck pain.  Skin: Negative for wound.  Neurological: Negative for dizziness, weakness, light-headedness, numbness and headaches.  Psychiatric/Behavioral: Negative for confusion and decreased concentration.    Physical Exam Updated Vital Signs BP (!) 170/96 (BP Location: Right Arm)   Pulse 66   Temp 97.6 F (36.4 C) (Oral)   Resp 16   Ht 5' 8.5" (1.74 m)   Wt 93.4 kg   SpO2 93%   BMI 30.87 kg/m   Physical Exam Vitals and nursing note reviewed.  Constitutional:  Appearance: He is well-developed.  HENT:     Head: Normocephalic. No raccoon eyes or Battle's sign.     Comments: Blood on scalp posterior to right ear without laceration.  Blood appears to be coming from the ear canal.    Right Ear: Decreased hearing noted. Drainage (blood) present.     Left Ear: Tympanic membrane, ear canal and external ear normal. No hemotympanum.     Ears:     Comments: Unable to visualize ear canal or TM on right 2/2 blood in canal.     Nose: Nose normal.  Eyes:     General: Lids are normal.     Conjunctiva/sclera: Conjunctivae normal.     Pupils: Pupils are equal, round, and reactive to light.     Comments: No visible hyphema  Cardiovascular:     Rate and Rhythm: Normal rate and regular rhythm.  Pulmonary:     Effort: Pulmonary effort is normal.     Breath sounds: Normal breath sounds.  Abdominal:     Palpations: Abdomen is soft.     Tenderness: There is no abdominal tenderness.  Musculoskeletal:        General: Normal range of motion.     Cervical back: Normal range of motion and neck supple. No tenderness or bony tenderness.     Thoracic back: No tenderness or bony tenderness.     Lumbar back: No tenderness or bony tenderness.     Comments: Moves upper and lower extremities.   Skin:    General: Skin is warm and dry.  Neurological:     Mental Status: He is alert and oriented to person, place, and time.     GCS: GCS eye  subscore is 4. GCS verbal subscore is 5. GCS motor subscore is 6.     Cranial Nerves: No cranial nerve deficit.     Sensory: No sensory deficit.     Coordination: Coordination normal.     Deep Tendon Reflexes: Reflexes are normal and symmetric.     ED Results / Procedures / Treatments   Labs (all labs ordered are listed, but only abnormal results are displayed) Labs Reviewed  BASIC METABOLIC PANEL - Abnormal; Notable for the following components:      Result Value   Glucose, Bld 121 (*)    All other components within normal limits  URINALYSIS, ROUTINE W REFLEX MICROSCOPIC - Abnormal; Notable for the following components:   Protein, ur 100 (*)    All other components within normal limits  SARS CORONAVIRUS 2 (TAT 6-24 HRS)  CBC WITH DIFFERENTIAL/PLATELET  PROTIME-INR  MAGNESIUM  PHOSPHORUS  RAPID URINE DRUG SCREEN, HOSP PERFORMED    ED ECG REPORT   Date: 09/27/2019  Rate: 68  Rhythm: normal sinus rhythm  QRS Axis: left  Intervals: normal  ST/T Wave abnormalities: nonspecific T wave changes  Conduction Disutrbances:nonspecific intraventricular conduction delay  Narrative Interpretation:   Old EKG Reviewed: unchanged  I have personally reviewed the EKG tracing and agree with the computerized printout as noted.  Radiology CT Head Wo Contrast  Result Date: 09/27/2019 CLINICAL DATA:  Head trauma. Head and neck pain. EXAM: CT HEAD WITHOUT CONTRAST CT MAXILLOFACIAL WITHOUT CONTRAST CT CERVICAL SPINE WITHOUT CONTRAST TECHNIQUE: Multidetector CT imaging of the head, cervical spine, and maxillofacial structures were performed using the standard protocol without intravenous contrast. Multiplanar CT image reconstructions of the cervical spine and maxillofacial structures were also generated. COMPARISON:  None. FINDINGS: CT HEAD FINDINGS Brain: Mild generalized age related parenchymal atrophy  with commensurate dilatation of the ventricles and sulci. Small amount of subarachnoid and/or  parenchymal hemorrhage within the lower aspects of the LEFT frontal lobe, presumably posttraumatic, with surrounding parenchymal edema. Additional smaller foci of subarachnoid hemorrhage overlying the RIGHT frontal lobe and RIGHT temporal lobe. No associated mass effect or midline shift seen. Thin acute subdural hematoma overlies the upper LEFT occipital lobe, measuring 6 mm thickness. No associated mass effect or midline shift. Vascular: Aortic atherosclerosis. Skull: Nondisplaced to minimally displaced fracture within the RIGHT temporal bone (series 4, image 19), with associated fluid/blood products throughout the mastoid air cells. Associated small foci of pneumocephalus underlying the RIGHT temporal bone/mastoid. The RIGHT temporal bone fracture extends centrally to the RIGHT occipital bone where it is nondisplaced. Associated blood products versus unrelated cerumen in the RIGHT external auditory canal. Other: None. CT MAXILLOFACIAL FINDINGS Osseous: Lower frontal bones are intact and normally aligned. Node displaced nasal bone fracture. Osseous structures about the orbits are intact and normally aligned bilaterally. Walls of the maxillary sinuses appear intact and normally aligned. Bilateral zygomatic arches and pterygoid plates are intact. No mandible fracture or displacement is seen. Nondisplaced minimally displaced fracture of the LEFT temporal bone (series 10, images 10 through 18, with extension to the LEFT occipital bone. Associated fluid/blood products within the mastoid air cells and external auditory canal. Orbits: Negative. No traumatic or inflammatory finding. Sinuses: Small amount of layering fluid and/or mucosal thickening in the maxillary sinuses. Soft tissues: No circumscribed soft tissue hematoma. CT CERVICAL SPINE FINDINGS Alignment: No evidence of acute vertebral body subluxation. Skull base and vertebrae: No fracture line or displaced fracture fragment is seen. Soft tissues and spinal canal:  No prevertebral fluid or swelling. No visible canal hematoma. Disc levels: Degenerative spondylosis, mild to moderate in degree. No more than mild central canal stenosis at any level. Upper chest: No acute findings. Chronic pleuroparenchymal scarring/fibrosis at the lung apices. Other: Bilateral carotid atherosclerosis. IMPRESSION: 1. Nondisplaced to minimally displaced fracture within the RIGHT temporal bone, with associated fluid/blood products throughout the mastoid air cells. The temporal bone fracture extends to the posterior wall of the external auditory canal and there is associated fluid/blood products within the EAC. The RIGHT temporal bone fracture also extends posteriorly-centrally to the RIGHT occipital bone where it is nondisplaced. Associated small foci of pneumocephalus underlying the RIGHT temporal bone/mastoid. 2. No additional facial bone fracture identified. 3. Thin acute subdural hematoma overlying the upper LEFT occipital lobe, measuring 6 mm thickness. No associated mass effect or midline shift. 4. Small amount of traumatic subarachnoid and/or parenchymal hemorrhage within the lower aspects of the bilateral frontal lobes and RIGHT temporal lobe, with associated parenchymal edema within the LEFT frontal lobe. No associated mass effect or midline shift. 5. No fracture or acute subluxation within the cervical spine. Degenerative spondylosis of the cervical spine, mild to moderate in degree. Aortic Atherosclerosis (ICD10-I70.0). Critical Value/emergent results were called by telephone at the time of interpretation on 09/27/2019 at 12:05 pm to provider [facility] , who verbally acknowledged these results. Electronically Signed   By: Bary Richard M.D.   On: 09/27/2019 12:09   CT Cervical Spine Wo Contrast  Result Date: 09/27/2019 CLINICAL DATA:  Head trauma. Head and neck pain. EXAM: CT HEAD WITHOUT CONTRAST CT MAXILLOFACIAL WITHOUT CONTRAST CT CERVICAL SPINE WITHOUT CONTRAST TECHNIQUE:  Multidetector CT imaging of the head, cervical spine, and maxillofacial structures were performed using the standard protocol without intravenous contrast. Multiplanar CT image reconstructions of the cervical spine and maxillofacial  structures were also generated. COMPARISON:  None. FINDINGS: CT HEAD FINDINGS Brain: Mild generalized age related parenchymal atrophy with commensurate dilatation of the ventricles and sulci. Small amount of subarachnoid and/or parenchymal hemorrhage within the lower aspects of the LEFT frontal lobe, presumably posttraumatic, with surrounding parenchymal edema. Additional smaller foci of subarachnoid hemorrhage overlying the RIGHT frontal lobe and RIGHT temporal lobe. No associated mass effect or midline shift seen. Thin acute subdural hematoma overlies the upper LEFT occipital lobe, measuring 6 mm thickness. No associated mass effect or midline shift. Vascular: Aortic atherosclerosis. Skull: Nondisplaced to minimally displaced fracture within the RIGHT temporal bone (series 4, image 19), with associated fluid/blood products throughout the mastoid air cells. Associated small foci of pneumocephalus underlying the RIGHT temporal bone/mastoid. The RIGHT temporal bone fracture extends centrally to the RIGHT occipital bone where it is nondisplaced. Associated blood products versus unrelated cerumen in the RIGHT external auditory canal. Other: None. CT MAXILLOFACIAL FINDINGS Osseous: Lower frontal bones are intact and normally aligned. Node displaced nasal bone fracture. Osseous structures about the orbits are intact and normally aligned bilaterally. Walls of the maxillary sinuses appear intact and normally aligned. Bilateral zygomatic arches and pterygoid plates are intact. No mandible fracture or displacement is seen. Nondisplaced minimally displaced fracture of the LEFT temporal bone (series 10, images 10 through 18, with extension to the LEFT occipital bone. Associated fluid/blood products  within the mastoid air cells and external auditory canal. Orbits: Negative. No traumatic or inflammatory finding. Sinuses: Small amount of layering fluid and/or mucosal thickening in the maxillary sinuses. Soft tissues: No circumscribed soft tissue hematoma. CT CERVICAL SPINE FINDINGS Alignment: No evidence of acute vertebral body subluxation. Skull base and vertebrae: No fracture line or displaced fracture fragment is seen. Soft tissues and spinal canal: No prevertebral fluid or swelling. No visible canal hematoma. Disc levels: Degenerative spondylosis, mild to moderate in degree. No more than mild central canal stenosis at any level. Upper chest: No acute findings. Chronic pleuroparenchymal scarring/fibrosis at the lung apices. Other: Bilateral carotid atherosclerosis. IMPRESSION: 1. Nondisplaced to minimally displaced fracture within the RIGHT temporal bone, with associated fluid/blood products throughout the mastoid air cells. The temporal bone fracture extends to the posterior wall of the external auditory canal and there is associated fluid/blood products within the EAC. The RIGHT temporal bone fracture also extends posteriorly-centrally to the RIGHT occipital bone where it is nondisplaced. Associated small foci of pneumocephalus underlying the RIGHT temporal bone/mastoid. 2. No additional facial bone fracture identified. 3. Thin acute subdural hematoma overlying the upper LEFT occipital lobe, measuring 6 mm thickness. No associated mass effect or midline shift. 4. Small amount of traumatic subarachnoid and/or parenchymal hemorrhage within the lower aspects of the bilateral frontal lobes and RIGHT temporal lobe, with associated parenchymal edema within the LEFT frontal lobe. No associated mass effect or midline shift. 5. No fracture or acute subluxation within the cervical spine. Degenerative spondylosis of the cervical spine, mild to moderate in degree. Aortic Atherosclerosis (ICD10-I70.0). Critical  Value/emergent results were called by telephone at the time of interpretation on 09/27/2019 at 12:05 pm to provider [facility] , who verbally acknowledged these results. Electronically Signed   By: Bary Richard M.D.   On: 09/27/2019 12:09   CT Maxillofacial Wo Contrast  Result Date: 09/27/2019 CLINICAL DATA:  Head trauma. Head and neck pain. EXAM: CT HEAD WITHOUT CONTRAST CT MAXILLOFACIAL WITHOUT CONTRAST CT CERVICAL SPINE WITHOUT CONTRAST TECHNIQUE: Multidetector CT imaging of the head, cervical spine, and maxillofacial structures were performed using  the standard protocol without intravenous contrast. Multiplanar CT image reconstructions of the cervical spine and maxillofacial structures were also generated. COMPARISON:  None. FINDINGS: CT HEAD FINDINGS Brain: Mild generalized age related parenchymal atrophy with commensurate dilatation of the ventricles and sulci. Small amount of subarachnoid and/or parenchymal hemorrhage within the lower aspects of the LEFT frontal lobe, presumably posttraumatic, with surrounding parenchymal edema. Additional smaller foci of subarachnoid hemorrhage overlying the RIGHT frontal lobe and RIGHT temporal lobe. No associated mass effect or midline shift seen. Thin acute subdural hematoma overlies the upper LEFT occipital lobe, measuring 6 mm thickness. No associated mass effect or midline shift. Vascular: Aortic atherosclerosis. Skull: Nondisplaced to minimally displaced fracture within the RIGHT temporal bone (series 4, image 19), with associated fluid/blood products throughout the mastoid air cells. Associated small foci of pneumocephalus underlying the RIGHT temporal bone/mastoid. The RIGHT temporal bone fracture extends centrally to the RIGHT occipital bone where it is nondisplaced. Associated blood products versus unrelated cerumen in the RIGHT external auditory canal. Other: None. CT MAXILLOFACIAL FINDINGS Osseous: Lower frontal bones are intact and normally aligned. Node  displaced nasal bone fracture. Osseous structures about the orbits are intact and normally aligned bilaterally. Walls of the maxillary sinuses appear intact and normally aligned. Bilateral zygomatic arches and pterygoid plates are intact. No mandible fracture or displacement is seen. Nondisplaced minimally displaced fracture of the LEFT temporal bone (series 10, images 10 through 18, with extension to the LEFT occipital bone. Associated fluid/blood products within the mastoid air cells and external auditory canal. Orbits: Negative. No traumatic or inflammatory finding. Sinuses: Small amount of layering fluid and/or mucosal thickening in the maxillary sinuses. Soft tissues: No circumscribed soft tissue hematoma. CT CERVICAL SPINE FINDINGS Alignment: No evidence of acute vertebral body subluxation. Skull base and vertebrae: No fracture line or displaced fracture fragment is seen. Soft tissues and spinal canal: No prevertebral fluid or swelling. No visible canal hematoma. Disc levels: Degenerative spondylosis, mild to moderate in degree. No more than mild central canal stenosis at any level. Upper chest: No acute findings. Chronic pleuroparenchymal scarring/fibrosis at the lung apices. Other: Bilateral carotid atherosclerosis. IMPRESSION: 1. Nondisplaced to minimally displaced fracture within the RIGHT temporal bone, with associated fluid/blood products throughout the mastoid air cells. The temporal bone fracture extends to the posterior wall of the external auditory canal and there is associated fluid/blood products within the EAC. The RIGHT temporal bone fracture also extends posteriorly-centrally to the RIGHT occipital bone where it is nondisplaced. Associated small foci of pneumocephalus underlying the RIGHT temporal bone/mastoid. 2. No additional facial bone fracture identified. 3. Thin acute subdural hematoma overlying the upper LEFT occipital lobe, measuring 6 mm thickness. No associated mass effect or midline  shift. 4. Small amount of traumatic subarachnoid and/or parenchymal hemorrhage within the lower aspects of the bilateral frontal lobes and RIGHT temporal lobe, with associated parenchymal edema within the LEFT frontal lobe. No associated mass effect or midline shift. 5. No fracture or acute subluxation within the cervical spine. Degenerative spondylosis of the cervical spine, mild to moderate in degree. Aortic Atherosclerosis (ICD10-I70.0). Critical Value/emergent results were called by telephone at the time of interpretation on 09/27/2019 at 12:05 pm to provider [facility] , who verbally acknowledged these results. Electronically Signed   By: Bary Richard M.D.   On: 09/27/2019 12:09    Procedures Procedures (including critical care time)  Medications Ordered in ED Medications - No data to display  ED Course  I have reviewed the triage vital signs and  the nursing notes.  Pertinent labs & imaging results that were available during my care of the patient were reviewed by me and considered in my medical decision making (see chart for details).  Patient seen and examined.  Wounds cleaned by RN and myself.  Discussed with Dr. Stark Jock who will see.   Vital signs reviewed and are as follows: BP (!) 170/96 (BP Location: Right Arm)   Pulse 66   Temp 97.6 F (36.4 C) (Oral)   Resp 16   Ht 5' 8.5" (1.74 m)   Wt 93.4 kg   SpO2 93%   BMI 30.87 kg/m   1:04 PM Discussed CT results with neurosurgery who will consult. Labs ordered.   2:23 PM [place] PA-C with neurosurgery has seen.  Keppra has been ordered.  No indications for emergent neurosurgical intervention.  Neurosurgery advises admission for frequent neuro checks and reimaging in the morning to assess for stability of bleeding.  I spoke with Dr. Doristine Bosworth of [facility] who will see.   CRITICAL CARE Performed by: Carlisle Cater PA-C Total critical care time: 35 minutes Critical care time was exclusive of separately billable procedures and treating  other patients. Critical care was necessary to treat or prevent imminent or life-threatening deterioration. Critical care was time spent personally by me on the following activities: development of treatment plan with patient and/or surrogate as well as nursing, discussions with consultants, evaluation of patient's response to treatment, examination of patient, obtaining history from patient or surrogate, ordering and performing treatments and interventions, ordering and review of laboratory studies, ordering and review of radiographic studies, pulse oximetry and re-evaluation of patient's condition.     MDM Rules/Calculators/A&P                      Admit.   Final Clinical Impression(s) / ED Diagnoses Final diagnoses:  Closed fracture of temporal bone, initial encounter ([place])  Subdural hematoma ([place])  Subarachnoid hemorrhage ([place])  Fall, initial encounter    Rx / DC Orders ED Discharge Orders    None       Carlisle Cater, PA-C 09/27/19 1532    Veryl Speak, MD 09/28/19 [phone]

## 2019-09-27 NOTE — ED Notes (Signed)
Patient transported to CT 

## 2019-09-27 NOTE — ED Notes (Signed)
Pt currently resting in the bed. Pt's O2 saturation dropped to 88-89%. Pt states he usually wears a CPAP at night. This RN placed pt on 2L [place] O2. Pt's O2 saturation now 94%.

## 2019-09-27 NOTE — ED Notes (Signed)
Reported pain # and bp to EDP

## 2019-09-27 NOTE — Progress Notes (Signed)
Pt admitted to the unit from ED via stretcher with spouse and belongings to the side. Pt A&O x4; ambulated from stretcher to bed upon arrival to the unit. Pt continue to ooze blood from right ear; skin assessment completed with second RN per protocol with no opened wound or pressure ulcer noted except abrasions to left elbow and left knee. petechie on abdomen. Telemetry applied and verified with CCMD; VSS, seizure precautions and fall safety precaution initiated. Pt placed on low bed, and floor mats on floor; spouse at bedside. Pt and spouse oriented to the unit and room; fall/safety precaution and prevention education completed and both voices understanding. Call light with in reach. Reported off to oncoming RN. Arabella Merles Shayanne Gomm RN   09/27/19 1855  Vitals  Temp 98.6 F (37 C)  Temp Source Oral  BP (!) 164/92  MAP (mmHg) 113  BP Location Right Arm  BP Method Automatic  Patient Position (if appropriate) Lying  Pulse Rate 79  Pulse Rate Source Monitor  ECG Heart Rate 80  Resp 18  Level of Consciousness  Level of Consciousness Alert  Oxygen Therapy  SpO2 99 %  O2 Device Nasal Cannula  O2 Flow Rate (L/min) 2 L/min  Pain Assessment  Pain Scale 0-10  Pain Score 5  Pain Type Acute pain;Chronic pain  Pain Location Head (and back)  Pain Descriptors / Indicators Aching  Pain Intervention(s) Repositioned;Emotional support;Rest  MEWS Score  MEWS Temp 0  MEWS Systolic 0  MEWS Pulse 0  MEWS RR 0  MEWS LOC 0  MEWS Score 0  MEWS Score Color [place] Si

## 2019-09-27 NOTE — ED Triage Notes (Signed)
Per [facility] pt coming from home where he was out on his patio when he fell backwards from a standing position, hitting back of head on the ground. Bleeding from right ear and now having a hard time hearing. Patient alert and orientated x 4. C-collar placed PTA.

## 2019-09-27 NOTE — Consult Note (Addendum)
Chief Complaint   Chief Complaint  Patient presents with  . Fall  . Head Injury    HPI   Consult requested by: Carlisle Cater PA, [facility] Reason for consult: tSAH, SDH, cerebral contusions  HPI: David Carroll is a 79 y.o. male with history MDD, opiate dependence, chronic back pain s/p lumbar fusion Dec 2020 with Dr Katherine Roan Jule Ser) who presented to the ED after an unwitnessed fall. Patient is amnestic to event and unable to provide any details. Per wife, patient was found outside on patio at base of step (1 step to ground). He has had difficulties with falling over the last several months which he attributes to recent lumbar fusion, although is scheduled for outpatient brain MRI this week. 1 week ago this past Sunday, wife noticed he was "confused" with sore throat, nasal congestion and worsening falls. He was seen at a local urgent care where it sounds like he underwent work up which was unremarkable and he gradually improved to baseline. Per wife he is at baseline today, with exception of amnesia from fall. He underwent work up by EDP and was found to have a temporal bone fracture that extends to [facility] and occipital bone as well as SDH/SAH. A NSY consultation was requested. Patient complains of moderate occipital headache. Unable to hear out of right ear since fall. No changes in vision, N/V, N/T/W.  Takes ASA for preventative purposes although last dose >1 week ago. Not on other blood thinning medications.  Patient Active Problem List   Diagnosis Date Noted  . MDD (major depressive disorder) 09/18/2013  . Opiate dependence ([place]) 09/18/2013    PMH: Past Medical History:  Diagnosis Date  . Arthritis    KNEES  . BPH (benign prostatic hypertrophy)   . Complication of anesthesia SLOW RECOVERING FROM ANES. DRUGS   HX OPIATE ADDICTION-- PT ON SUBOXONE  . Depression   . Glaucoma   . History of gastric ulcer   . History of gastroesophageal reflux (GERD)   . History of kidney  stones   . Hydronephrosis, left   . Hypertension   . Nephrolithiasis   . Opiate addiction ([place])   . Urgency of urination     PSH: Past Surgical History:  Procedure Laterality Date  . CATARACT EXTRACTION W/ INTRAOCULAR LENS  IMPLANT, BILATERAL    . CYSTOSCOPY WITH BIOPSY Left 11/06/2012   Procedure: CYSTOSCOPY WITH BIOPSY;  Surgeon: Alexis Frock, MD;  Location: [facility];  Service: Urology;  Laterality: Left;  . EXTRACORPOREAL SHOCK WAVE LITHOTRIPSY Left 03-06-2011  . HOLMIUM LASER APPLICATION Left 2/40/9735   Procedure: HOLMIUM LASER APPLICATION;  Surgeon: Alexis Frock, MD;  Location: [facility];  Service: Urology;  Laterality: Left;  . INGUINAL HERNIA REPAIR Bilateral 1970   RE-DO LEFT 1985  . LUMBAR DISC SURGERY  09-24-2008   L3 -- L4  . LUMBAR LAMINECTOMY  05/15/2019   with partial facet joint removal  . MEDIAL COMPARTMENT ARTHROPLASTY, LEFT KNEE  02-22-2010  . REMOVAL BENIGN MASS LEFT LUNG  1977  . VAGOTOMY  1986   ULCERS    (Not in a hospital admission)   SH: Social History   Tobacco Use  . Smoking status: Former Smoker    Packs/day: 2.00    Years: 40.00    Pack years: 80.00    Types: Cigarettes    Quit date: 10/31/2006    Years since quitting: 12.9  . Smokeless tobacco: Never Used  Substance Use Topics  . Alcohol  use: Yes    Alcohol/week: 7.0 standard drinks    Types: 7 Cans of beer per week  . Drug use: No    Comment: hx opiate addiction in past, stopped Suboxone 4 days ago    MEDS: Prior to Admission medications   Medication Sig Start Date End Date Taking? Authorizing Provider  acidophilus (RISAQUAD) CAPS Take 1 capsule by mouth daily.    [provider]  amLODipine (NORVASC) 10 MG tablet Take 10 mg by mouth daily.    [provider]  amLODipine (NORVASC) 5 MG tablet Take 5 mg by mouth every morning.     [provider]  amphetamine-dextroamphetamine (ADDERALL) 15 MG tablet Take 15 mg by  mouth 2 (two) times daily. 30 mg ER    [provider]  aspirin EC 81 MG tablet Take 81 mg by mouth every evening.     [provider]  buPROPion (WELLBUTRIN XL) 300 MG 24 hr tablet Take 300 mg by mouth at bedtime.    [provider]  Cholecalciferol (VITAMIN D) 2000 UNITS tablet Take 2,000 Units by mouth daily.    [provider]  co-enzyme Q-10 30 MG capsule Take 30 mg by mouth at bedtime.     [provider]  doxazosin (CARDURA XL) 8 MG 24 hr tablet Take 8 mg by mouth at bedtime.     [provider]  gabapentin (NEURONTIN) 300 MG capsule Take 300 mg by mouth 2 (two) times daily.    [provider]  gabapentin (NEURONTIN) 400 MG capsule Take 400 mg by mouth 3 (three) times daily.    [provider]  ibuprofen (ADVIL,MOTRIN) 800 MG tablet Take 800 mg by mouth daily as needed for moderate pain (pain).    [provider]  losartan (COZAAR) 50 MG tablet Take 50 mg by mouth daily.    [provider]  oxybutynin (DITROPAN) 5 MG tablet Take 1 tablet (5 mg total) by mouth every 8 (eight) hours as needed. For bladder spasms / stent colic Patient not taking: Reported on 06/18/2019 11/06/12   Sebastian AcheManny, Theodore, MD  oxyCODONE-acetaminophen (PERCOCET) 5-325 MG per tablet Take 1-2 tablets by mouth every 6 (six) hours as needed. Patient not taking: Reported on 06/18/2019 07/13/14   Charlynne PanderYao, David Hsienta, MD  psyllium (METAMUCIL) 58.6 % powder Take 1 packet by mouth daily.    [provider]  sennosides-docusate sodium (SENOKOT-S) 8.6-50 MG tablet Take 1 tablet by mouth 2 (two) times daily. While taking pain meds to prevent constipation. Patient taking differently: Take 1 tablet by mouth daily as needed for constipation (constipation). While taking pain meds to prevent constipation. 11/06/12   Sebastian AcheManny, Theodore, MD  sertraline (ZOLOFT) 100 MG tablet Take 100 mg by mouth at bedtime.    [provider]    ALLERGY: No  Known Allergies  Social History   Tobacco Use  . Smoking status: Former Smoker    Packs/day: 2.00    Years: 40.00    Pack years: 80.00    Types: Cigarettes    Quit date: 10/31/2006    Years since quitting: 12.9  . Smokeless tobacco: Never Used  Substance Use Topics  . Alcohol use: Yes    Alcohol/week: 7.0 standard drinks    Types: 7 Cans of beer per week     Family History  Problem Relation Age of Onset  . Depression Mother      ROS   Review of Systems  Constitutional: Negative.   HENT:  Positive for ear discharge (right ear, blood) and ear pain.   Eyes: Negative.  Negative for blurred vision, double vision and photophobia.  Respiratory: Negative.   Cardiovascular: Negative.  Negative for chest pain.  Gastrointestinal: Negative.  Negative for nausea and vomiting.  Genitourinary: Negative.   Musculoskeletal: Positive for back pain (chronic, no worsening). Negative for myalgias and neck pain.  Skin: Negative.   Neurological: Positive for headaches. Negative for dizziness, tingling, tremors, sensory change, speech change, focal weakness, seizures and weakness.    Exam   Vitals:   09/27/19 1100 09/27/19 1300  BP: (!) 159/91 (!) 178/97  Pulse: 66 67  Resp: 17 17  Temp:    SpO2: 94% 97%   General appearance: elderly male, resting comfortable  GCS 15 Eyes: No scleral injection Cardiovascular: Regular rate and rhythm without murmurs, rubs, gallops. No edema or variciosities. Distal pulses normal. Pulmonary: Effort normal, non-labored breathing Musculoskeletal:     Muscle tone upper extremities: Normal    Muscle tone lower extremities: Normal    Motor exam: Upper Extremities Deltoid Bicep Tricep Grip  Right 5/5 5/5 5/5 5/5  Left 5/5 5/5 5/5 5/5   Lower Extremity IP Quad PF DF EHL  Right 5/5 5/5 5/5 5/5 5/5  Left 5/5 5/5 5/5 5/5 5/5   Neurological Mental Status:    - Patient is awake, alert, oriented to person, place, month, year, and situation    - Patient is  unable to provide any details regarding fall    - No signs of aphasia or neglect Cranial Nerves    - II: Visual Fields are full. PERRL    - III/IV/VI: EOMI without ptosis or diploplia.     - V: Facial sensation is grossly normal    - VII: Facial movement is symmetric.     - VIII: hearing normal left ear.     - X: Uvula elevates symmetrically    - XI: Shoulder shrug is symmetric.    - XII: tongue is midline without atrophy or fasciculations.  Sensory: Sensation grossly intact to LT Plantars   - Toes are downgoing bilaterally.  Cerebellar    - FNF and HKS are intact bilaterally  Right EAC: blood in canal. Unable to visualize TM. No bleeding at present. No drainage consistent with CSF. Nares: No rhinorrhea  Results - Imaging/Labs   Results for orders placed or performed during the hospital encounter of 09/27/19 (from the past 48 hour(s))  CBC with Differential/Platelet     Status: None   Collection Time: 09/27/19 12:57 PM  Result Value Ref Range   WBC 8.4 4.0 - 10.5 K/uL   RBC 4.40 4.22 - 5.81 MIL/uL   Hemoglobin 14.0 13.0 - 17.0 g/dL   HCT 66.4 40.3 - 47.4 %   MCV 95.2 80.0 - 100.0 fL   MCH 31.8 26.0 - 34.0 pg   MCHC 33.4 30.0 - 36.0 g/dL   RDW 25.9 56.3 - 87.5 %   Platelets 199 150 - 400 K/uL   nRBC 0.0 0.0 - 0.2 %   Neutrophils Relative % 77 %   Neutro Abs 6.4 1.7 - 7.7 K/uL   Lymphocytes Relative 13 %   Lymphs Abs 1.1 0.7 - 4.0 K/uL   Monocytes Relative 7 %   Monocytes Absolute 0.6 0.1 - 1.0 K/uL   Eosinophils Relative 2 %   Eosinophils Absolute 0.2 0.0 - 0.5 K/uL   Basophils Relative 0 %   Basophils Absolute 0.0 0.0 - 0.1 K/uL  Immature Granulocytes 1 %   Abs Immature Granulocytes 0.06 0.00 - 0.07 K/uL    Comment: Performed at [facility], [address], [place], [place] [address]  Protime-INR     Status: None   Collection Time: 09/27/19 12:57 PM  Result Value Ref Range   Prothrombin Time 13.7 11.4 - 15.2 seconds   INR 1.1 0.8 - 1.2    Comment:  (NOTE) INR goal varies based on device and disease states. Performed at [facility], [address] [place], [place] [address]   Urinalysis, Routine w reflex microscopic     Status: Abnormal   Collection Time: 09/27/19 12:58 PM  Result Value Ref Range   Color, Urine YELLOW YELLOW   APPearance CLEAR CLEAR   Specific Gravity, Urine 1.016 1.005 - 1.030   pH 6.0 5.0 - 8.0   Glucose, UA NEGATIVE NEGATIVE mg/dL   Hgb urine dipstick NEGATIVE NEGATIVE   Bilirubin Urine NEGATIVE NEGATIVE   Ketones, ur NEGATIVE NEGATIVE mg/dL   Protein, ur 098 (A) NEGATIVE mg/dL   Nitrite NEGATIVE NEGATIVE   Leukocytes,Ua NEGATIVE NEGATIVE   RBC / HPF 0-5 0 - 5 RBC/hpf   WBC, UA 11-20 0 - 5 WBC/hpf   Bacteria, UA NONE SEEN NONE SEEN    Comment: Performed at [facility], [address] [place], [place] [address]    CT Head Wo Contrast  Result Date: 09/27/2019 CLINICAL DATA:  Head trauma. Head and neck pain. EXAM: CT HEAD WITHOUT CONTRAST CT MAXILLOFACIAL WITHOUT CONTRAST CT CERVICAL SPINE WITHOUT CONTRAST TECHNIQUE: Multidetector CT imaging of the head, cervical spine, and maxillofacial structures were performed using the standard protocol without intravenous contrast. Multiplanar CT image reconstructions of the cervical spine and maxillofacial structures were also generated. COMPARISON:  None. FINDINGS: CT HEAD FINDINGS Brain: Mild generalized age related parenchymal atrophy with commensurate dilatation of the ventricles and sulci. Small amount of subarachnoid and/or parenchymal hemorrhage within the lower aspects of the LEFT frontal lobe, presumably posttraumatic, with surrounding parenchymal edema. Additional smaller foci of subarachnoid hemorrhage overlying the RIGHT frontal lobe and RIGHT temporal lobe. No associated mass effect or midline shift seen. Thin acute subdural hematoma overlies the upper LEFT occipital lobe, measuring 6 mm thickness. No associated mass effect or midline shift.  Vascular: Aortic atherosclerosis. Skull: Nondisplaced to minimally displaced fracture within the RIGHT temporal bone (series 4, image 19), with associated fluid/blood products throughout the mastoid air cells. Associated small foci of pneumocephalus underlying the RIGHT temporal bone/mastoid. The RIGHT temporal bone fracture extends centrally to the RIGHT occipital bone where it is nondisplaced. Associated blood products versus unrelated cerumen in the RIGHT external auditory canal. Other: None. CT MAXILLOFACIAL FINDINGS Osseous: Lower frontal bones are intact and normally aligned. Node displaced nasal bone fracture. Osseous structures about the orbits are intact and normally aligned bilaterally. Walls of the maxillary sinuses appear intact and normally aligned. Bilateral zygomatic arches and pterygoid plates are intact. No mandible fracture or displacement is seen. Nondisplaced minimally displaced fracture of the LEFT temporal bone (series 10, images 10 through 18, with extension to the LEFT occipital bone. Associated fluid/blood products within the mastoid air cells and external auditory canal. Orbits: Negative. No traumatic or inflammatory finding. Sinuses: Small amount of layering fluid and/or mucosal thickening in the maxillary sinuses. Soft tissues: No circumscribed soft tissue hematoma. CT CERVICAL SPINE FINDINGS Alignment: No evidence of acute vertebral body subluxation. Skull base and vertebrae: No fracture line or displaced fracture fragment is seen.  Soft tissues and spinal canal: No prevertebral fluid or swelling. No visible canal hematoma. Disc levels: Degenerative spondylosis, mild to moderate in degree. No more than mild central canal stenosis at any level. Upper chest: No acute findings. Chronic pleuroparenchymal scarring/fibrosis at the lung apices. Other: Bilateral carotid atherosclerosis. IMPRESSION: 1. Nondisplaced to minimally displaced fracture within the RIGHT temporal bone, with associated  fluid/blood products throughout the mastoid air cells. The temporal bone fracture extends to the posterior wall of the external auditory canal and there is associated fluid/blood products within the EAC. The RIGHT temporal bone fracture also extends posteriorly-centrally to the RIGHT occipital bone where it is nondisplaced. Associated small foci of pneumocephalus underlying the RIGHT temporal bone/mastoid. 2. No additional facial bone fracture identified. 3. Thin acute subdural hematoma overlying the upper LEFT occipital lobe, measuring 6 mm thickness. No associated mass effect or midline shift. 4. Small amount of traumatic subarachnoid and/or parenchymal hemorrhage within the lower aspects of the bilateral frontal lobes and RIGHT temporal lobe, with associated parenchymal edema within the LEFT frontal lobe. No associated mass effect or midline shift. 5. No fracture or acute subluxation within the cervical spine. Degenerative spondylosis of the cervical spine, mild to moderate in degree. Aortic Atherosclerosis (ICD10-I70.0). Critical Value/emergent results were called by telephone at the time of interpretation on 09/27/2019 at 12:05 pm to provider [facility] , who verbally acknowledged these results. Electronically Signed   By: Bary Richard M.D.   On: 09/27/2019 12:09   CT Cervical Spine Wo Contrast  Result Date: 09/27/2019 CLINICAL DATA:  Head trauma. Head and neck pain. EXAM: CT HEAD WITHOUT CONTRAST CT MAXILLOFACIAL WITHOUT CONTRAST CT CERVICAL SPINE WITHOUT CONTRAST TECHNIQUE: Multidetector CT imaging of the head, cervical spine, and maxillofacial structures were performed using the standard protocol without intravenous contrast. Multiplanar CT image reconstructions of the cervical spine and maxillofacial structures were also generated. COMPARISON:  None. FINDINGS: CT HEAD FINDINGS Brain: Mild generalized age related parenchymal atrophy with commensurate dilatation of the ventricles and sulci. Small  amount of subarachnoid and/or parenchymal hemorrhage within the lower aspects of the LEFT frontal lobe, presumably posttraumatic, with surrounding parenchymal edema. Additional smaller foci of subarachnoid hemorrhage overlying the RIGHT frontal lobe and RIGHT temporal lobe. No associated mass effect or midline shift seen. Thin acute subdural hematoma overlies the upper LEFT occipital lobe, measuring 6 mm thickness. No associated mass effect or midline shift. Vascular: Aortic atherosclerosis. Skull: Nondisplaced to minimally displaced fracture within the RIGHT temporal bone (series 4, image 19), with associated fluid/blood products throughout the mastoid air cells. Associated small foci of pneumocephalus underlying the RIGHT temporal bone/mastoid. The RIGHT temporal bone fracture extends centrally to the RIGHT occipital bone where it is nondisplaced. Associated blood products versus unrelated cerumen in the RIGHT external auditory canal. Other: None. CT MAXILLOFACIAL FINDINGS Osseous: Lower frontal bones are intact and normally aligned. Node displaced nasal bone fracture. Osseous structures about the orbits are intact and normally aligned bilaterally. Walls of the maxillary sinuses appear intact and normally aligned. Bilateral zygomatic arches and pterygoid plates are intact. No mandible fracture or displacement is seen. Nondisplaced minimally displaced fracture of the LEFT temporal bone (series 10, images 10 through 18, with extension to the LEFT occipital bone. Associated fluid/blood products within the mastoid air cells and external auditory canal. Orbits: Negative. No traumatic or inflammatory finding. Sinuses: Small amount of layering fluid and/or mucosal thickening in the maxillary sinuses. Soft tissues: No circumscribed soft tissue hematoma. CT CERVICAL SPINE FINDINGS Alignment: No evidence of  acute vertebral body subluxation. Skull base and vertebrae: No fracture line or displaced fracture fragment is seen.  Soft tissues and spinal canal: No prevertebral fluid or swelling. No visible canal hematoma. Disc levels: Degenerative spondylosis, mild to moderate in degree. No more than mild central canal stenosis at any level. Upper chest: No acute findings. Chronic pleuroparenchymal scarring/fibrosis at the lung apices. Other: Bilateral carotid atherosclerosis. IMPRESSION: 1. Nondisplaced to minimally displaced fracture within the RIGHT temporal bone, with associated fluid/blood products throughout the mastoid air cells. The temporal bone fracture extends to the posterior wall of the external auditory canal and there is associated fluid/blood products within the EAC. The RIGHT temporal bone fracture also extends posteriorly-centrally to the RIGHT occipital bone where it is nondisplaced. Associated small foci of pneumocephalus underlying the RIGHT temporal bone/mastoid. 2. No additional facial bone fracture identified. 3. Thin acute subdural hematoma overlying the upper LEFT occipital lobe, measuring 6 mm thickness. No associated mass effect or midline shift. 4. Small amount of traumatic subarachnoid and/or parenchymal hemorrhage within the lower aspects of the bilateral frontal lobes and RIGHT temporal lobe, with associated parenchymal edema within the LEFT frontal lobe. No associated mass effect or midline shift. 5. No fracture or acute subluxation within the cervical spine. Degenerative spondylosis of the cervical spine, mild to moderate in degree. Aortic Atherosclerosis (ICD10-I70.0). Critical Value/emergent results were called by telephone at the time of interpretation on 09/27/2019 at 12:05 pm to provider [facility] , who verbally acknowledged these results. Electronically Signed   By: Bary Richard M.D.   On: 09/27/2019 12:09   CT Maxillofacial Wo Contrast  Result Date: 09/27/2019 CLINICAL DATA:  Head trauma. Head and neck pain. EXAM: CT HEAD WITHOUT CONTRAST CT MAXILLOFACIAL WITHOUT CONTRAST CT CERVICAL SPINE  WITHOUT CONTRAST TECHNIQUE: Multidetector CT imaging of the head, cervical spine, and maxillofacial structures were performed using the standard protocol without intravenous contrast. Multiplanar CT image reconstructions of the cervical spine and maxillofacial structures were also generated. COMPARISON:  None. FINDINGS: CT HEAD FINDINGS Brain: Mild generalized age related parenchymal atrophy with commensurate dilatation of the ventricles and sulci. Small amount of subarachnoid and/or parenchymal hemorrhage within the lower aspects of the LEFT frontal lobe, presumably posttraumatic, with surrounding parenchymal edema. Additional smaller foci of subarachnoid hemorrhage overlying the RIGHT frontal lobe and RIGHT temporal lobe. No associated mass effect or midline shift seen. Thin acute subdural hematoma overlies the upper LEFT occipital lobe, measuring 6 mm thickness. No associated mass effect or midline shift. Vascular: Aortic atherosclerosis. Skull: Nondisplaced to minimally displaced fracture within the RIGHT temporal bone (series 4, image 19), with associated fluid/blood products throughout the mastoid air cells. Associated small foci of pneumocephalus underlying the RIGHT temporal bone/mastoid. The RIGHT temporal bone fracture extends centrally to the RIGHT occipital bone where it is nondisplaced. Associated blood products versus unrelated cerumen in the RIGHT external auditory canal. Other: None. CT MAXILLOFACIAL FINDINGS Osseous: Lower frontal bones are intact and normally aligned. Node displaced nasal bone fracture. Osseous structures about the orbits are intact and normally aligned bilaterally. Walls of the maxillary sinuses appear intact and normally aligned. Bilateral zygomatic arches and pterygoid plates are intact. No mandible fracture or displacement is seen. Nondisplaced minimally displaced fracture of the LEFT temporal bone (series 10, images 10 through 18, with extension to the LEFT occipital bone.  Associated fluid/blood products within the mastoid air cells and external auditory canal. Orbits: Negative. No traumatic or inflammatory finding. Sinuses: Small amount of layering fluid and/or mucosal thickening in the  maxillary sinuses. Soft tissues: No circumscribed soft tissue hematoma. CT CERVICAL SPINE FINDINGS Alignment: No evidence of acute vertebral body subluxation. Skull base and vertebrae: No fracture line or displaced fracture fragment is seen. Soft tissues and spinal canal: No prevertebral fluid or swelling. No visible canal hematoma. Disc levels: Degenerative spondylosis, mild to moderate in degree. No more than mild central canal stenosis at any level. Upper chest: No acute findings. Chronic pleuroparenchymal scarring/fibrosis at the lung apices. Other: Bilateral carotid atherosclerosis. IMPRESSION: 1. Nondisplaced to minimally displaced fracture within the RIGHT temporal bone, with associated fluid/blood products throughout the mastoid air cells. The temporal bone fracture extends to the posterior wall of the external auditory canal and there is associated fluid/blood products within the EAC. The RIGHT temporal bone fracture also extends posteriorly-centrally to the RIGHT occipital bone where it is nondisplaced. Associated small foci of pneumocephalus underlying the RIGHT temporal bone/mastoid. 2. No additional facial bone fracture identified. 3. Thin acute subdural hematoma overlying the upper LEFT occipital lobe, measuring 6 mm thickness. No associated mass effect or midline shift. 4. Small amount of traumatic subarachnoid and/or parenchymal hemorrhage within the lower aspects of the bilateral frontal lobes and RIGHT temporal lobe, with associated parenchymal edema within the LEFT frontal lobe. No associated mass effect or midline shift. 5. No fracture or acute subluxation within the cervical spine. Degenerative spondylosis of the cervical spine, mild to moderate in degree. Aortic Atherosclerosis  (ICD10-I70.0). Critical Value/emergent results were called by telephone at the time of interpretation on 09/27/2019 at 12:05 pm to provider [facility] , who verbally acknowledged these results. Electronically Signed   By: Bary Richard M.D.   On: 09/27/2019 12:09   Impression/Plan   79 y.o. male found to have a temporal bone fracture that extends to R EAC and occipital bone as well as SDH/SAH after a fall earlier today. He is amnestic to event but otherwise neurologically intact. ?syncope as fall unwitnessed, although with history of recurrent falls. Patient will need to be admitted for observation & repeat head CT tomorrow. Would appreciate TH admission given age and for possible syncope rule out. Also complaining of recurrent falls, s/p lumbar fusion.   Temporal bone fracture that extends to right EAC and R occipital bone - non displaced - No evidence of CSF drainage on exam - No role for NS intervention - Pain control - May need outpatient follow up with ENT  16mm L occipital SDH, bifrontal contusions  - No MLS, hydrocephalus, mass effect - No role for NS intervention - frequent neuro checks - Repeat head CT tomorrow am, sooner as indicated by exam. Would expect contusions to blossom on repeat imaging - Keppra 500mg  BID x7days for routine seizure prophylaxis  Recurrent falls, s/p lumbar fusion - Followed by outpt PT - Continue f/u with Dr - MRI brain scheduled this week?  Petra Kuba, PA-C [facility] [place]

## 2019-09-28 ENCOUNTER — Inpatient Hospital Stay (HOSPITAL_COMMUNITY): Payer: Medicare Other

## 2019-09-28 DIAGNOSIS — W19XXXA Unspecified fall, initial encounter: Secondary | ICD-10-CM | POA: Diagnosis not present

## 2019-09-28 DIAGNOSIS — I10 Essential (primary) hypertension: Secondary | ICD-10-CM

## 2019-09-28 DIAGNOSIS — Y92009 Unspecified place in unspecified non-institutional (private) residence as the place of occurrence of the external cause: Secondary | ICD-10-CM

## 2019-09-28 LAB — CBC
HCT: 35.9 % — ABNORMAL LOW (ref 39.0–52.0)
Hemoglobin: 12.2 g/dL — ABNORMAL LOW (ref 13.0–17.0)
MCH: 33 pg (ref 26.0–34.0)
MCHC: 34 g/dL (ref 30.0–36.0)
MCV: 97 fL (ref 80.0–100.0)
Platelets: 157 10*3/uL (ref 150–400)
RBC: 3.7 MIL/uL — ABNORMAL LOW (ref 4.22–5.81)
RDW: 13.1 % (ref 11.5–15.5)
WBC: 6.7 10*3/uL (ref 4.0–10.5)
nRBC: 0 % (ref 0.0–0.2)

## 2019-09-28 LAB — BASIC METABOLIC PANEL
Anion gap: 8 (ref 5–15)
BUN: 13 mg/dL (ref 8–23)
CO2: 30 mmol/L (ref 22–32)
Calcium: 8.3 mg/dL — ABNORMAL LOW (ref 8.9–10.3)
Chloride: 101 mmol/L (ref 98–111)
Creatinine, Ser: 1.29 mg/dL — ABNORMAL HIGH (ref 0.61–1.24)
GFR calc Af Amer: >60 mL/min (ref >60)
GFR calc non Af Amer: 52 mL/min — ABNORMAL LOW (ref >60)
Glucose, Bld: 134 mg/dL — ABNORMAL HIGH (ref 70–99)
Potassium: 3.4 mmol/L — ABNORMAL LOW (ref 3.5–5.1)
Sodium: 139 mmol/L (ref 135–145)

## 2019-09-28 MED ORDER — OXYCODONE HCL 5 MG PO CAPS: 5.0000 mg | ORAL_CAPSULE | ORAL | 0 refills | Status: DC | PRN

## 2019-09-28 MED ORDER — LEVETIRACETAM 500 MG PO TABS: 500.0000 mg | ORAL_TABLET | Freq: Two times a day (BID) | ORAL | 0 refills | Status: DC

## 2019-09-28 NOTE — Discharge Summary (Addendum)
Physician Discharge Summary  David Carroll ZOX:[id number] DOB: 1941-04-01 DOA: 09/27/2019  PCP: Vivien Presto, MD  Admit date: 09/27/2019 Discharge date: 09/28/2019  Admitted From: Home Disposition: Home  Recommendations for Outpatient Follow-up:  1. Follow up with PCP in 1-2 weeks 2. Please obtain BMP/CBC in one week your next doctors visit.  3. Keppra 7 days twice daily for seizure prophylaxis per neurosurgery 4. Follow-up with outpatient neurosurgery in about 3-4 weeks.  At the same time obtain repeat CT of the head 5. Oxycodone  IR q4hrs, 15 tabs for pain   Discharge Condition: Stable CODE STATUS: Full code Diet recommendation: Heart healthy  Brief/Interim Summary: 79 y.o. male with medical history significant of hypertension, major depression, generalized anxiety, BPH, chronic opioid dependence, chronic back pain status post lumbar fusion in December 2020 with Dr. Petra Kuba, history of recurrent falls, alcohol and marijuana abuse presents to emergency department after an unwitnessed fall.  Patient tells me that he was in the backyard this morning and he fell backwards from a standing position and hit on his head.  Patient does not remember the event.  He tells me that he had no dizziness, lightheadedness, blurry vision, headache prior to the fall.  Has recurrent falls over the last several months which he attributes to recent lumbar fusion.  He is scheduled for outpatient brain MRI this week.  He is not on any anticoagulation.  He takes ibuprofen for his back pain.  Takes Xanax, Valium as needed at bedtime, Zoloft for major depression/anxiety.  He tells me that he takes Adderall for his depression?.  Takes Lasix for his bilateral lower extremity edema.  No history of seizure, loss of consciousness, chest pain, shortness of breath, fever, chills, cough, congestion, urinary or bowel changes.  He lives with his wife at home.  No history of smoking however drinks vodka every  night and uses marijuana every day.  Upon admission noted to have right temporal bone fracture, subdural hematoma, small traumatic subarachnoid hemorrhage.  Patient was seen by by neurosurgery, repeat CT head done the following day which was stable.  Cleared for discharge.  Was given 7 days of prophylaxis Keppra.   Discharge Diagnoses:  Principal Problem:   Fall at home, initial encounter Active Problems:   MDD (major depressive disorder)   Opiate dependence (HCC)   Hypertension   Benign prostatic hyperplasia   Chronic back pain   Temporal bone fracture (HCC)   SDH (subdural hematoma) (HCC)   Fall   Marijuana abuse   Alcohol abuse    Consultations:  Neurosurgery  Subjective: Feels okay no complaints today.  Discharge Exam: Vitals:   09/28/19 1144 09/28/19 1550  BP: 131/82   Pulse: 79 80  Resp: 16   Temp: 98.8 F (37.1 C)   SpO2: 95% 94%   Vitals:   09/28/19 0329 09/28/19 0744 09/28/19 1144 09/28/19 1550  BP: 126/75 (!) 159/92 131/82   Pulse: 80 85 79 80  Resp: Temp: 98.4 F (36.9 C) 98.6 F (37 C) 98.8 F (37.1 C)   TempSrc: Oral Oral Oral   SpO2: 98% 99% 95% 94%  Weight:      Height:        General: Pt is alert, awake, not in acute distress Cardiovascular: RRR, S1/S2 +, no rubs, no gallops Respiratory: CTA bilaterally, no wheezing, no rhonchi Abdominal: Soft, NT, ND, bowel sounds + Extremities: no edema, no cyanosis  Discharge Instructions   Allergies as of 09/28/2019  No Known Allergies     Medication List    TAKE these medications   ALPRAZolam 1 MG tablet Commonly known as: XANAX Take 1 mg by mouth 3 (three) times daily as needed.   amLODipine 10 MG tablet Commonly known as: NORVASC Take 10 mg by mouth daily.   amphetamine-dextroamphetamine 15 MG tablet Commonly known as: ADDERALL Take 30 mg by mouth daily. 30 mg ER   carisoprodol 350 MG tablet Commonly known as: SOMA Take 350 mg by mouth 3 (three) times daily as  needed.   chlorpheniramine 4 MG tablet Commonly known as: CHLOR-TRIMETON Take 4 mg by mouth 2 (two) times daily as needed for allergies.   co-enzyme Q-10 30 MG capsule Take 30 mg by mouth at bedtime.   diazepam 10 MG tablet Commonly known as: VALIUM Take 10 mg by mouth every 6 (six) hours as needed.   doxazosin 8 MG 24 hr tablet Commonly known as: CARDURA XL Take 8 mg by mouth at bedtime.   furosemide 40 MG tablet Commonly known as: LASIX Take 40 mg by mouth daily.   ibuprofen 800 MG tablet Commonly known as: ADVIL Take 600 mg by mouth every 6 (six) hours as needed for moderate pain (pain).   levETIRAcetam 500 MG tablet Commonly known as: KEPPRA Take 1 tablet (500 mg total) by mouth 2 (two) times daily for 7 days.   montelukast 10 MG tablet Commonly known as: SINGULAIR Take 10 mg by mouth daily.   oxybutynin 5 MG tablet Commonly known as: DITROPAN Take 1 tablet (5 mg total) by mouth every 8 (eight) hours as needed. For bladder spasms / stent colic   oxycodone 5 MG capsule Commonly known as: OXY-IR Take 1 capsule (5 mg total) by mouth every 4 (four) hours as needed.   oxyCODONE-acetaminophen 5-325 MG tablet Commonly known as: Percocet Take 1-2 tablets by mouth every 6 (six) hours as needed.   psyllium 58.6 % powder Commonly known as: METAMUCIL Take 1 packet by mouth daily.   sennosides-docusate sodium 8.6-50 MG tablet Commonly known as: SENOKOT-S Take 1 tablet by mouth 2 (two) times daily. While taking pain meds to prevent constipation. What changed:   when to take this  reasons to take this   sertraline 100 MG tablet Commonly known as: ZOLOFT Take 100 mg by mouth daily.   Vitamin D 50 MCG (2000 UT) tablet Take 2,000 Units by mouth daily.      Follow-up Information    Corrington, Kip A, MD. Schedule an appointment as soon as possible for a visit in 1 week(s).   Specialty: Family Medicine Contact information: [address] [place]  [address] [phone]          No Known Allergies  You were cared for by a hospitalist during your hospital stay. If you have any questions about your discharge medications or the care you received while you were in the hospital after you are discharged, you can call the unit and asked to speak with the hospitalist on call if the hospitalist that took care of you is not available. Once you are discharged, your primary care physician will handle any further medical issues. Please note that no refills for any discharge medications will be authorized once you are discharged, as it is imperative that you return to your primary care physician (or establish a relationship with a primary care physician if you do not have one) for your aftercare needs so that they can reassess your  need for medications and monitor your lab values.   Procedures/Studies: CT HEAD WO CONTRAST  Result Date: 09/28/2019 CLINICAL DATA:  Follow-up subdural hemorrhage. EXAM: CT HEAD WITHOUT CONTRAST TECHNIQUE: Contiguous axial images were obtained from the base of the skull through the vertex without intravenous contrast. COMPARISON:  09/27/2019 FINDINGS: Brain: A small subdural hematoma over the left cerebral convexity measures up to 5 mm in thickness, not significantly changed and without associated mass effect. Scattered small volume subarachnoid hemorrhage over both cerebral convexities is unchanged. There is a 3 mm focus of hemorrhage in the left genu of the corpus callosum suggesting shear injury, more conspicuous than on the prior study but not new. There is minimally increased blood in the occipital horns of the lateral ventricles which may reflect redistribution of subarachnoid hemorrhage. Mild edema associated with contusions in the anterior left frontal lobe and right frontal operculum has increased. There is also likely a small contusion in the anterior left temporal lobe. There is no midline shift. The ventricles are  unchanged in size. No acute large territory infarct is evident. Mild cerebral atrophy and mild chronic small vessel ischemic white matter disease are again noted. Vascular: Calcified atherosclerosis at the skull base. Skull: Nondisplaced right-sided skull fracture with involvement of the temporal bone as previously described. Sinuses/Orbits: Mild mucosal thickening and secretions in the paranasal sinuses. Increased, moderate right mastoid air cell and right middle ear opacification likely reflecting blood. Cerumen or other material in both external auditory canals. Bilateral cataract extraction. Other: Mild posterior scalp soft tissue swelling. IMPRESSION: 1. Increased edema associated with small contusions in the left temporal and bilateral frontal lobes. 2. Unchanged small left-sided subdural hematoma without mass effect. 3. Unchanged small volume subarachnoid hemorrhage. Electronically Signed   By: Sebastian Ache M.D.   On: 09/28/2019 08:31   CT Head Wo Contrast  Result Date: 09/27/2019 CLINICAL DATA:  Head trauma. Head and neck pain. EXAM: CT HEAD WITHOUT CONTRAST CT MAXILLOFACIAL WITHOUT CONTRAST CT CERVICAL SPINE WITHOUT CONTRAST TECHNIQUE: Multidetector CT imaging of the head, cervical spine, and maxillofacial structures were performed using the standard protocol without intravenous contrast. Multiplanar CT image reconstructions of the cervical spine and maxillofacial structures were also generated. COMPARISON:  None. FINDINGS: CT HEAD FINDINGS Brain: Mild generalized age related parenchymal atrophy with commensurate dilatation of the ventricles and sulci. Small amount of subarachnoid and/or parenchymal hemorrhage within the lower aspects of the LEFT frontal lobe, presumably posttraumatic, with surrounding parenchymal edema. Additional smaller foci of subarachnoid hemorrhage overlying the RIGHT frontal lobe and RIGHT temporal lobe. No associated mass effect or midline shift seen. Thin acute subdural  hematoma overlies the upper LEFT occipital lobe, measuring 6 mm thickness. No associated mass effect or midline shift. Vascular: Aortic atherosclerosis. Skull: Nondisplaced to minimally displaced fracture within the RIGHT temporal bone (series 4, image 19), with associated fluid/blood products throughout the mastoid air cells. Associated small foci of pneumocephalus underlying the RIGHT temporal bone/mastoid. The RIGHT temporal bone fracture extends centrally to the RIGHT occipital bone where it is nondisplaced. Associated blood products versus unrelated cerumen in the RIGHT external auditory canal. Other: None. CT MAXILLOFACIAL FINDINGS Osseous: Lower frontal bones are intact and normally aligned. Node displaced nasal bone fracture. Osseous structures about the orbits are intact and normally aligned bilaterally. Walls of the maxillary sinuses appear intact and normally aligned. Bilateral zygomatic arches and pterygoid plates are intact. No mandible fracture or displacement is seen. Nondisplaced minimally displaced fracture of the LEFT temporal bone (series 10, images 10  through 18, with extension to the LEFT occipital bone. Associated fluid/blood products within the mastoid air cells and external auditory canal. Orbits: Negative. No traumatic or inflammatory finding. Sinuses: Small amount of layering fluid and/or mucosal thickening in the maxillary sinuses. Soft tissues: No circumscribed soft tissue hematoma. CT CERVICAL SPINE FINDINGS Alignment: No evidence of acute vertebral body subluxation. Skull base and vertebrae: No fracture line or displaced fracture fragment is seen. Soft tissues and spinal canal: No prevertebral fluid or swelling. No visible canal hematoma. Disc levels: Degenerative spondylosis, mild to moderate in degree. No more than mild central canal stenosis at any level. Upper chest: No acute findings. Chronic pleuroparenchymal scarring/fibrosis at the lung apices. Other: Bilateral carotid  atherosclerosis. IMPRESSION: 1. Nondisplaced to minimally displaced fracture within the RIGHT temporal bone, with associated fluid/blood products throughout the mastoid air cells. The temporal bone fracture extends to the posterior wall of the external auditory canal and there is associated fluid/blood products within the EAC. The RIGHT temporal bone fracture also extends posteriorly-centrally to the RIGHT occipital bone where it is nondisplaced. Associated small foci of pneumocephalus underlying the RIGHT temporal bone/mastoid. 2. No additional facial bone fracture identified. 3. Thin acute subdural hematoma overlying the upper LEFT occipital lobe, measuring 6 mm thickness. No associated mass effect or midline shift. 4. Small amount of traumatic subarachnoid and/or parenchymal hemorrhage within the lower aspects of the bilateral frontal lobes and RIGHT temporal lobe, with associated parenchymal edema within the LEFT frontal lobe. No associated mass effect or midline shift. 5. No fracture or acute subluxation within the cervical spine. Degenerative spondylosis of the cervical spine, mild to moderate in degree. Aortic Atherosclerosis (ICD10-I70.0). Critical Value/emergent results were called by telephone at the time of interpretation on 09/27/2019 at 12:05 pm to provider [facility] , who verbally acknowledged these results. Electronically Signed   By: Bary Richard M.D.   On: 09/27/2019 12:09   CT Cervical Spine Wo Contrast  Result Date: 09/27/2019 CLINICAL DATA:  Head trauma. Head and neck pain. EXAM: CT HEAD WITHOUT CONTRAST CT MAXILLOFACIAL WITHOUT CONTRAST CT CERVICAL SPINE WITHOUT CONTRAST TECHNIQUE: Multidetector CT imaging of the head, cervical spine, and maxillofacial structures were performed using the standard protocol without intravenous contrast. Multiplanar CT image reconstructions of the cervical spine and maxillofacial structures were also generated. COMPARISON:  None. FINDINGS: CT HEAD FINDINGS  Brain: Mild generalized age related parenchymal atrophy with commensurate dilatation of the ventricles and sulci. Small amount of subarachnoid and/or parenchymal hemorrhage within the lower aspects of the LEFT frontal lobe, presumably posttraumatic, with surrounding parenchymal edema. Additional smaller foci of subarachnoid hemorrhage overlying the RIGHT frontal lobe and RIGHT temporal lobe. No associated mass effect or midline shift seen. Thin acute subdural hematoma overlies the upper LEFT occipital lobe, measuring 6 mm thickness. No associated mass effect or midline shift. Vascular: Aortic atherosclerosis. Skull: Nondisplaced to minimally displaced fracture within the RIGHT temporal bone (series 4, image 19), with associated fluid/blood products throughout the mastoid air cells. Associated small foci of pneumocephalus underlying the RIGHT temporal bone/mastoid. The RIGHT temporal bone fracture extends centrally to the RIGHT occipital bone where it is nondisplaced. Associated blood products versus unrelated cerumen in the RIGHT external auditory canal. Other: None. CT MAXILLOFACIAL FINDINGS Osseous: Lower frontal bones are intact and normally aligned. Node displaced nasal bone fracture. Osseous structures about the orbits are intact and normally aligned bilaterally. Walls of the maxillary sinuses appear intact and normally aligned. Bilateral zygomatic arches and pterygoid plates are intact. No mandible fracture  or displacement is seen. Nondisplaced minimally displaced fracture of the LEFT temporal bone (series 10, images 10 through 18, with extension to the LEFT occipital bone. Associated fluid/blood products within the mastoid air cells and external auditory canal. Orbits: Negative. No traumatic or inflammatory finding. Sinuses: Small amount of layering fluid and/or mucosal thickening in the maxillary sinuses. Soft tissues: No circumscribed soft tissue hematoma. CT CERVICAL SPINE FINDINGS Alignment: No evidence  of acute vertebral body subluxation. Skull base and vertebrae: No fracture line or displaced fracture fragment is seen. Soft tissues and spinal canal: No prevertebral fluid or swelling. No visible canal hematoma. Disc levels: Degenerative spondylosis, mild to moderate in degree. No more than mild central canal stenosis at any level. Upper chest: No acute findings. Chronic pleuroparenchymal scarring/fibrosis at the lung apices. Other: Bilateral carotid atherosclerosis. IMPRESSION: 1. Nondisplaced to minimally displaced fracture within the RIGHT temporal bone, with associated fluid/blood products throughout the mastoid air cells. The temporal bone fracture extends to the posterior wall of the external auditory canal and there is associated fluid/blood products within the EAC. The RIGHT temporal bone fracture also extends posteriorly-centrally to the RIGHT occipital bone where it is nondisplaced. Associated small foci of pneumocephalus underlying the RIGHT temporal bone/mastoid. 2. No additional facial bone fracture identified. 3. Thin acute subdural hematoma overlying the upper LEFT occipital lobe, measuring 6 mm thickness. No associated mass effect or midline shift. 4. Small amount of traumatic subarachnoid and/or parenchymal hemorrhage within the lower aspects of the bilateral frontal lobes and RIGHT temporal lobe, with associated parenchymal edema within the LEFT frontal lobe. No associated mass effect or midline shift. 5. No fracture or acute subluxation within the cervical spine. Degenerative spondylosis of the cervical spine, mild to moderate in degree. Aortic Atherosclerosis (ICD10-I70.0). Critical Value/emergent results were called by telephone at the time of interpretation on 09/27/2019 at 12:05 pm to provider [facility] , who verbally acknowledged these results. Electronically Signed   By: Franki Cabot M.D.   On: 09/27/2019 12:09   CT Maxillofacial Wo Contrast  Result Date: 09/27/2019 CLINICAL DATA:   Head trauma. Head and neck pain. EXAM: CT HEAD WITHOUT CONTRAST CT MAXILLOFACIAL WITHOUT CONTRAST CT CERVICAL SPINE WITHOUT CONTRAST TECHNIQUE: Multidetector CT imaging of the head, cervical spine, and maxillofacial structures were performed using the standard protocol without intravenous contrast. Multiplanar CT image reconstructions of the cervical spine and maxillofacial structures were also generated. COMPARISON:  None. FINDINGS: CT HEAD FINDINGS Brain: Mild generalized age related parenchymal atrophy with commensurate dilatation of the ventricles and sulci. Small amount of subarachnoid and/or parenchymal hemorrhage within the lower aspects of the LEFT frontal lobe, presumably posttraumatic, with surrounding parenchymal edema. Additional smaller foci of subarachnoid hemorrhage overlying the RIGHT frontal lobe and RIGHT temporal lobe. No associated mass effect or midline shift seen. Thin acute subdural hematoma overlies the upper LEFT occipital lobe, measuring 6 mm thickness. No associated mass effect or midline shift. Vascular: Aortic atherosclerosis. Skull: Nondisplaced to minimally displaced fracture within the RIGHT temporal bone (series 4, image 19), with associated fluid/blood products throughout the mastoid air cells. Associated small foci of pneumocephalus underlying the RIGHT temporal bone/mastoid. The RIGHT temporal bone fracture extends centrally to the RIGHT occipital bone where it is nondisplaced. Associated blood products versus unrelated cerumen in the RIGHT external auditory canal. Other: None. CT MAXILLOFACIAL FINDINGS Osseous: Lower frontal bones are intact and normally aligned. Node displaced nasal bone fracture. Osseous structures about the orbits are intact and normally aligned bilaterally. Walls of the maxillary sinuses  appear intact and normally aligned. Bilateral zygomatic arches and pterygoid plates are intact. No mandible fracture or displacement is seen. Nondisplaced minimally displaced  fracture of the LEFT temporal bone (series 10, images 10 through 18, with extension to the LEFT occipital bone. Associated fluid/blood products within the mastoid air cells and external auditory canal. Orbits: Negative. No traumatic or inflammatory finding. Sinuses: Small amount of layering fluid and/or mucosal thickening in the maxillary sinuses. Soft tissues: No circumscribed soft tissue hematoma. CT CERVICAL SPINE FINDINGS Alignment: No evidence of acute vertebral body subluxation. Skull base and vertebrae: No fracture line or displaced fracture fragment is seen. Soft tissues and spinal canal: No prevertebral fluid or swelling. No visible canal hematoma. Disc levels: Degenerative spondylosis, mild to moderate in degree. No more than mild central canal stenosis at any level. Upper chest: No acute findings. Chronic pleuroparenchymal scarring/fibrosis at the lung apices. Other: Bilateral carotid atherosclerosis. IMPRESSION: 1. Nondisplaced to minimally displaced fracture within the RIGHT temporal bone, with associated fluid/blood products throughout the mastoid air cells. The temporal bone fracture extends to the posterior wall of the external auditory canal and there is associated fluid/blood products within the EAC. The RIGHT temporal bone fracture also extends posteriorly-centrally to the RIGHT occipital bone where it is nondisplaced. Associated small foci of pneumocephalus underlying the RIGHT temporal bone/mastoid. 2. No additional facial bone fracture identified. 3. Thin acute subdural hematoma overlying the upper LEFT occipital lobe, measuring 6 mm thickness. No associated mass effect or midline shift. 4. Small amount of traumatic subarachnoid and/or parenchymal hemorrhage within the lower aspects of the bilateral frontal lobes and RIGHT temporal lobe, with associated parenchymal edema within the LEFT frontal lobe. No associated mass effect or midline shift. 5. No fracture or acute subluxation within the  cervical spine. Degenerative spondylosis of the cervical spine, mild to moderate in degree. Aortic Atherosclerosis (ICD10-I70.0). Critical Value/emergent results were called by telephone at the time of interpretation on 09/27/2019 at 12:05 pm to provider [facility] , who verbally acknowledged these results. Electronically Signed   By: Bary RichardStan  Maynard M.D.   On: 09/27/2019 12:09     The results of significant diagnostics from this hospitalization (including imaging, microbiology, ancillary and laboratory) are listed below for reference.     Microbiology: Recent Results (from the past 240 hour(s))  SARS CORONAVIRUS 2 (TAT 6-24 HRS) Nasopharyngeal Nasopharyngeal Swab     Status: None   Collection Time: 09/27/19  7:48 PM   Specimen: Nasopharyngeal Swab  Result Value Ref Range Status   SARS Coronavirus 2 NEGATIVE NEGATIVE Final    Comment: (NOTE) SARS-CoV-2 target nucleic acids are NOT DETECTED. The SARS-CoV-2 RNA is generally detectable in upper and lower respiratory specimens during the acute phase of infection. Negative results do not preclude SARS-CoV-2 infection, do not rule out co-infections with other pathogens, and should not be used as the sole basis for treatment or other patient management decisions. Negative results must be combined with clinical observations, patient history, and epidemiological information. The expected result is Negative. Fact Sheet for Patients: HairSlick.nohttps://www.fda.gov/media/138098/download Fact Sheet for Healthcare Providers: quierodirigir.comhttps://www.fda.gov/media/138095/download This test is not yet approved or cleared by the [place] FDA and  has been authorized for detection and/or diagnosis of SARS-CoV-2 by FDA under an Emergency Use Authorization (EUA). This EUA will remain  in effect (meaning this test can be used) for the duration of the COVID-19 declaration under Section 56 4(b)(1) of the Act, 21 U.S.C. section 360bbb-3(b)(1), unless the authorization is  terminated or revoked sooner.  Performed at [facility], [address], [place], [place] [address]      Labs: BNP (last 3 results) No results for input(s): BNP in the last 8760 hours. Basic Metabolic Panel: Recent Labs  Lab 09/27/19 1257 09/27/19 1503 09/28/19 0436  NA 139  --  139  K 3.9  --  3.4*  CL 100  --  101  CO2 31  --  30  GLUCOSE 121*  --  134*  BUN 19  --  13  CREATININE 1.11  --  1.29*  CALCIUM 9.2  --  8.3*  MG  --  2.3  --   PHOS  --  3.1  --    Liver Function Tests: No results for input(s): AST, ALT, ALKPHOS, BILITOT, PROT, ALBUMIN in the last 168 hours. No results for input(s): LIPASE, AMYLASE in the last 168 hours. No results for input(s): AMMONIA in the last 168 hours. CBC: Recent Labs  Lab 09/27/19 1257 09/28/19 0436  WBC 8.4 6.7  NEUTROABS 6.4  --   HGB 14.0 12.2*  HCT 41.9 35.9*  MCV 95.2 97.0  PLT 199 157   Cardiac Enzymes: No results for input(s): CKTOTAL, CKMB, CKMBINDEX, TROPONINI in the last 168 hours. BNP: Invalid input(s): POCBNP CBG: No results for input(s): GLUCAP in the last 168 hours. D-Dimer No results for input(s): DDIMER in the last 72 hours. Hgb A1c No results for input(s): HGBA1C in the last 72 hours. Lipid Profile No results for input(s): CHOL, HDL, LDLCALC, TRIG, CHOLHDL, LDLDIRECT in the last 72 hours. Thyroid function studies No results for input(s): TSH, T4TOTAL, T3FREE, THYROIDAB in the last 72 hours.  Invalid input(s): FREET3 Anemia work up No results for input(s): VITAMINB12, FOLATE, FERRITIN, TIBC, IRON, RETICCTPCT in the last 72 hours. Urinalysis    Component Value Date/Time   COLORURINE YELLOW 09/27/2019 1258   APPEARANCEUR CLEAR 09/27/2019 1258   LABSPEC 1.016 09/27/2019 1258   PHURINE 6.0 09/27/2019 1258   GLUCOSEU NEGATIVE 09/27/2019 1258   HGBUR NEGATIVE 09/27/2019 1258   BILIRUBINUR NEGATIVE 09/27/2019 1258   KETONESUR NEGATIVE 09/27/2019 1258   PROTEINUR 100 (A) 09/27/2019 1258    UROBILINOGEN 0.2 10/15/2012 0119   NITRITE NEGATIVE 09/27/2019 1258   LEUKOCYTESUR NEGATIVE 09/27/2019 1258   Sepsis Labs Invalid input(s): PROCALCITONIN,  WBC,  LACTICIDVEN Microbiology Recent Results (from the past 240 hour(s))  SARS CORONAVIRUS 2 (TAT 6-24 HRS) Nasopharyngeal Nasopharyngeal Swab     Status: None   Collection Time: 09/27/19  7:48 PM   Specimen: Nasopharyngeal Swab  Result Value Ref Range Status   SARS Coronavirus 2 NEGATIVE NEGATIVE Final    Comment: (NOTE) SARS-CoV-2 target nucleic acids are NOT DETECTED. The SARS-CoV-2 RNA is generally detectable in upper and lower respiratory specimens during the acute phase of infection. Negative results do not preclude SARS-CoV-2 infection, do not rule out co-infections with other pathogens, and should not be used as the sole basis for treatment or other patient management decisions. Negative results must be combined with clinical observations, patient history, and epidemiological information. The expected result is Negative. Fact Sheet for Patients: HairSlick.no Fact Sheet for Healthcare Providers: quierodirigir.com This test is not yet approved or cleared by the [place] FDA and  has been authorized for detection and/or diagnosis of SARS-CoV-2 by FDA under an Emergency Use Authorization (EUA). This EUA will remain  in effect (meaning this test can be used) for the duration of the COVID-19 declaration under Section 56 4(b)(1) of the Act, 21 U.S.C.  section 360bbb-3(b)(1), unless the authorization is terminated or revoked sooner. Performed at [facility], [address], [place], [place] [address]      Time coordinating discharge:  I have spent 35 minutes face to face with the patient and on the ward discussing the patients care, assessment, plan and disposition with other care givers. >50% of the time was devoted counseling the patient about the risks and  benefits of treatment/Discharge disposition and coordinating care.   SIGNED:   Dimple Nanas, MD  [facility] 09/28/2019, 4:42 PM   If 7PM-7AM, please contact night-coverage

## 2019-09-28 NOTE — Progress Notes (Signed)
Pt discharge education and instructions completed with pt and spouse at bedside; both voices understanding and denies any questions. Pt IV and telemetry removed; pt discharge home with spouse to transport him home. Pt to pick up electronically sent oxycodone and Keppra prescriptions from preferred pharmacy on file. Pt wife requested for copy of pt CT of head scan and Radiology contacted; pt CT of head put on CD for pt and pt handed the CD. Pt handed the MOON letter printed by case manager to give to pt at discharge. Pt transported off unit via wheelchair with belongings and spouse at bedside. Dionne Bucy RN

## 2019-09-28 NOTE — Care Management CC44 (Signed)
Condition Code 44 Documentation Completed  Patient Details  Name: David Carroll MRN: [id number] Date of Birth: Oct 12, 1940   Condition Code 44 given:  Yes Patient signature on Condition Code 44 notice:  Yes Documentation of 2 MD's agreement:  Yes Code 44 added to claim:  Yes    Lawerance Sabal, RN 09/28/2019, 12:17 PM

## 2019-09-28 NOTE — Care Management Obs Status (Signed)
MEDICARE OBSERVATION STATUS NOTIFICATION   Patient Details  Name: David Carroll MRN: [id number] Date of Birth: 04/17/1941   Medicare Observation Status Notification Given:  Yes    Lawerance Sabal, RN 09/28/2019, 12:17 PM

## 2019-09-28 NOTE — Progress Notes (Signed)
Pt back to room from CT scan. Dionne Bucy RN

## 2019-09-28 NOTE — Evaluation (Signed)
Physical Therapy Evaluation Patient Details Name: David Carroll MRN: [id number] DOB: 06/24/1940 Today's Date: 09/28/2019   History of Present Illness  Pt is an 79 yo male presenting s/p fall with posterior head trauma + LOC, and bleeding from R ear. Following repeat CT, there are no significant changes or concerns from a neurology standpoint. Pt PMH includes: multiple falls (3 in past month), HTN, depression, anxiety, opiod dependence, chronic LBP, and alcohol/marijuana abuse.  Clinical Impression  Pt in bed upon arrival of PT, agreeable to evaluation at this time. Prior to admission the pt was mostly independent with intermittent use of walking stick for ambulation, but reports multiple recent falls at home. The pt lives with his wife who can assist as needed, and they have 2 steps to enter with 6 steps to reach the bedroom. The pt now presents with limitations in functional mobility, dynamic stability, and endurance due to above dx, and will continue to benefit from skilled PT to address these deficits. The pt was able to demo good bed mobility without assist, but benefits from supervision and assist for transfers and ambulation to increase safety. The pt was able to ambulate 160 ft in the hallway, but had 2 instances of SpO2 dropping to 88% on RA (recovered to 95% with standing rest and guided breathing).  The pt and his wife were also educated on safety with stair navigation and both verbalized good understanding. The pt is safe to d/c home with supervision and minimal assist from wife, but will continue to benefit from skilled PT to further progress functional mobility, stability, and endurance.      Follow Up Recommendations Supervision/Assistance - 24 hour;Outpatient PT    Equipment Recommendations  (pt has needed equipment)    Recommendations for Other Services       Precautions / Restrictions Precautions Precautions: Fall Precaution Comments: pt reports multiple falls in last  month Restrictions Weight Bearing Restrictions: No      Mobility  Bed Mobility Overal bed mobility: Modified Independent                Transfers Overall transfer level: Needs assistance Equipment used: Rolling walker (2 wheeled) Transfers: Sit to/from Stand Sit to Stand: Supervision         General transfer comment: pt able to complete without assist, VC for hand placement to avoid plopping, pt with improved use of hands without cues in future transfers  Ambulation/Gait Ambulation/Gait assistance: Min guard;Supervision Gait Distance (Feet): 160 Feet Assistive device: Rolling walker (2 wheeled) Gait Pattern/deviations: Step-through pattern;Decreased stride length;Shuffle Gait velocity: 0.4 m/s Gait velocity interpretation: 1.31 - 2.62 ft/sec, indicative of limited community ambulator General Gait Details: pt with very short strides (wife states this is his normal), but generally good use of RW, no LOB, progressing from minG to supervision. Pt with x2 episodes of SpO2 to 88% on RA, reovered to 94% with guided breathing and standing rest.  Stairs Stairs: Yes Stairs assistance: Min guard Stair Management: One rail Right;Two rails;Alternating pattern;Step to pattern;Forwards Number of Stairs: 2 General stair comments: pt completed 2 steps x4 with different use of hand rails to practice safely nagivating different stair set ups. Both pt and wife verbalized agreement and understanding  Wheelchair Mobility    Modified Rankin (Stroke Patients Only)       Balance Overall balance assessment: Needs assistance Sitting-balance support: No upper extremity supported Sitting balance-Leahy Scale: Good     Standing balance support: Bilateral upper extremity supported;During functional activity Standing balance-Leahy Scale: Fair  Standing balance comment: able to static stand without UE support, BUE support for ambulation                 Standardized Balance  Assessment Standardized Balance Assessment : Dynamic Gait Index   Dynamic Gait Index Level Surface: Mild Impairment Change in Gait Speed: Normal Gait with Horizontal Head Turns: Mild Impairment Gait with Vertical Head Turns: Moderate Impairment Gait and Pivot Turn: Mild Impairment Step Over Obstacle: Mild Impairment Step Around Obstacles: Moderate Impairment Steps: Mild Impairment Total Score: 15       Pertinent Vitals/Pain Pain Assessment: 0-10 Pain Score: 5  Pain Location: headache Pain Descriptors / Indicators: Sore;Headache Pain Intervention(s): Monitored during session;Limited activity within patient's tolerance    Home Living Family/patient expects to be discharged to:: Private residence Living Arrangements: Spouse/significant other Available Help at Discharge: Family Type of Home: House Home Access: Stairs to enter Entrance Stairs-Rails: None Technical brewer of Steps: 1-2 Home Layout: Multi-level Home Equipment: Grab bars - toilet;Cane - single point Additional Comments: pt reports using walking stick more than other AD    Prior Function Level of Independence: Independent with assistive device(s)         Comments: Pt reports 3 falls in last week, mostly sliding off EOB. was in PT following back surgery in december, reports recent increase in pain from additional yardwork. Pt still able to drive, retired     Journalist, newspaper   Dominant Hand: Right    Extremity/Trunk Assessment   Upper Extremity Assessment Upper Extremity Assessment: Overall WFL for tasks assessed    Lower Extremity Assessment Lower Extremity Assessment: Overall WFL for tasks assessed    Cervical / Trunk Assessment Cervical / Trunk Assessment: Kyphotic  Communication   Communication: No difficulties  Cognition Arousal/Alertness: Awake/alert Behavior During Therapy: WFL for tasks assessed/performed                                   General Comments: Pt with  some STM deficits (forgetting room x3 through session), but generally functional with good recall of house, situation, and safety awareness      General Comments      Exercises     Assessment/Plan    PT Assessment Patient needs continued PT services  PT Problem List Decreased strength;Decreased mobility;Decreased balance;Decreased coordination;Decreased activity tolerance       PT Treatment Interventions DME instruction;Therapeutic exercise;Gait training;Stair training;Functional mobility training;Therapeutic activities;Patient/family education;Balance training    PT Goals (Current goals can be found in the Care Plan section)  Acute Rehab PT Goals Patient Stated Goal: return home PT Goal Formulation: With patient Time For Goal Achievement: 10/12/19 Potential to Achieve Goals: Good    Frequency Min 3X/week   Barriers to discharge        Co-evaluation               AM-PAC PT "6 Clicks" Mobility  Outcome Measure Help needed turning from your back to your side while in a flat bed without using bedrails?: None Help needed moving from lying on your back to sitting on the side of a flat bed without using bedrails?: None Help needed moving to and from a bed to a chair (including a wheelchair)?: A Little Help needed standing up from a chair using your arms (e.g., wheelchair or bedside chair)?: A Little Help needed to walk in hospital room?: A Little Help needed climbing 3-5 steps with a railing? :  A Little 6 Click Score: 20    End of Session Equipment Utilized During Treatment: Gait belt Activity Tolerance: Patient tolerated treatment well Patient left: in chair;with nursing/sitter in room;with family/visitor present Nurse Communication: Mobility status PT Visit Diagnosis: Unsteadiness on feet (R26.81);Repeated falls (R29.6);Difficulty in walking, not elsewhere classified (R26.2)    Time: 4196-2229 PT Time Calculation (min) (ACUTE ONLY): 36 min   Charges:   PT  Evaluation $PT Eval Moderate Complexity: 1 Mod PT Treatments $Gait Training: 8-22 mins        Rolm Baptise, PT, DPT   Acute Rehabilitation Department Pager #: [phone]   Gaetana Michaelis 09/28/2019, 4:03 PM

## 2019-09-28 NOTE — Progress Notes (Signed)
Pt transported off unit to CT. P. Amo Gennie Eisinger RN 

## 2019-09-28 NOTE — Progress Notes (Addendum)
  NEUROSURGERY PROGRESS NOTE   No issues overnight.  Complains of appropriate HA No new N/T/W  EXAM:  BP (!) 159/92 (BP Location: Right Arm)   Pulse 85   Temp 98.6 F (37 C) (Oral)   Resp 20   Ht 5' 8.5" (1.74 m)   Wt 93.4 kg   SpO2 99%   BMI 30.87 kg/m   Awake, alert, oriented  Speech fluent, appropriate  CN grossly intact  5/5 BUE/BLE   IMPRESSION/PLAN 79 y.o. male s/p fall with temporal bone fx, SDH and bifrontal contusions. Neurologically intact. - awaiting repeat head CT for monitoring.  Addendum Repeat head CT reviewed with Dr Conchita Paris. No significant changes, expected contusion edema. Cleared for d/c from NS perspective. Plan to f/u in 3-4 weeks for monitoring. Patient does have an established NS in [place] and can f/u with her if he prefers. Remain off ASA. Complete 7 day course of keppra.

## 2019-10-22 ENCOUNTER — Ambulatory Visit: Payer: Medicare Other | Attending: Nurse Practitioner | Admitting: Physical Therapy

## 2019-10-22 ENCOUNTER — Other Ambulatory Visit: Payer: Self-pay

## 2019-10-22 ENCOUNTER — Encounter: Payer: Self-pay | Admitting: Physical Therapy

## 2019-10-22 DIAGNOSIS — M545 Low back pain, unspecified: Secondary | ICD-10-CM

## 2019-10-22 DIAGNOSIS — M5442 Lumbago with sciatica, left side: Secondary | ICD-10-CM | POA: Insufficient documentation

## 2019-10-22 DIAGNOSIS — G8929 Other chronic pain: Secondary | ICD-10-CM

## 2019-10-22 DIAGNOSIS — M6281 Muscle weakness (generalized): Secondary | ICD-10-CM | POA: Insufficient documentation

## 2019-10-22 NOTE — Therapy (Signed)
[facility] [place], [place], [address] Phone: [phone]   Fax:  [phone]  Physical Therapy Treatment  Patient Details  Name: David Carroll MRN: [id number] Date of Birth: 1940-09-15 Referring Provider (PT): Milas Kocher, NP   Encounter Date: 10/22/2019  PT End of Session - 10/22/19 1408    Visit Number  17    Number of Visits  24    Date for PT Re-Evaluation  11/28/19    Authorization Type  FOTO 171h visit 42% limitation; PROGRESS NOTE AT 10TH VISIT.  KX MODIFIER AFTER 15 VISITS.    PT Start Time  0158    PT Stop Time  0238    PT Time Calculation (min)  40 min    Activity Tolerance  Patient limited by pain;Patient tolerated treatment well    Behavior During Therapy  [facility] for tasks assessed/performed       Past Medical History:  Diagnosis Date  . Arthritis    KNEES  . BPH (benign prostatic hypertrophy)   . Complication of anesthesia SLOW RECOVERING FROM ANES. DRUGS   HX OPIATE ADDICTION-- PT ON SUBOXONE  . Depression   . Glaucoma   . History of gastric ulcer   . History of gastroesophageal reflux (GERD)   . History of kidney stones   . Hydronephrosis, left   . Hypertension   . Nephrolithiasis   . Opiate addiction ([place])   . Urgency of urination     Past Surgical History:  Procedure Laterality Date  . CATARACT EXTRACTION W/ INTRAOCULAR LENS  IMPLANT, BILATERAL    . CYSTOSCOPY WITH BIOPSY Left 11/06/2012   Procedure: CYSTOSCOPY WITH BIOPSY;  Surgeon: Alexis Frock, MD;  Location: [facility];  Service: Urology;  Laterality: Left;  . EXTRACORPOREAL SHOCK WAVE LITHOTRIPSY Left 03-06-2011  . HOLMIUM LASER APPLICATION Left 3/70/4888   Procedure: HOLMIUM LASER APPLICATION;  Surgeon: Alexis Frock, MD;  Location: [facility];  Service: Urology;  Laterality: Left;  . INGUINAL HERNIA REPAIR Bilateral 1970   RE-DO LEFT 1985  . LUMBAR DISC SURGERY  09-24-2008   L3 -- L4  .  LUMBAR LAMINECTOMY  05/15/2019   with partial facet joint removal  . MEDIAL COMPARTMENT ARTHROPLASTY, LEFT KNEE  02-22-2010  . REMOVAL BENIGN MASS LEFT LUNG  1977  . VAGOTOMY  1986   ULCERS    There were no vitals filed for this visit.  Subjective Assessment - 10/22/19 1400    Subjective  COVID-19 screening performed upon arrival. Patient arrives with increased pain after his fall a month ago    Pertinent History  Lumbar laminectomy L4-L5 05/15/2019, Left TKA, HTN    Limitations  House hold activities;Walking    How long can you stand comfortably?  20 minutes.    How long can you walk comfortably?  Short community distances.    Patient Stated Goals  improve mobility and return to [facility] routine at [facility]    Currently in Pain?  Yes    Pain Score  10-Worst pain ever    Pain Location  Back    Pain Orientation  Lower    Pain Descriptors / Indicators  Discomfort    Pain Type  Chronic pain    Pain Onset  More than a month ago    Pain Frequency  Constant    Aggravating Factors   prolong standing activity    Pain Relieving Factors  at rest  [facility] Adult PT Treatment/Exercise - 10/22/19 0001      Lumbar Exercises: Aerobic   Nustep  L1-2 x69min UE/LE activity, monitored for progression      Moist Heat Therapy   Number Minutes Moist Heat  10 Minutes    Moist Heat Location  Lumbar Spine      Electrical Stimulation   Electrical Stimulation Location  lumbar    Electrical Stimulation Action  IFC    Electrical Stimulation Parameters  80-150hz  x46min    Electrical Stimulation Goals  Pain      Ultrasound   Ultrasound Location  Bil low back    Ultrasound Parameters  US@1 .5w/cm2/50%/20mhz x59min    Ultrasound Goals  Pain      Manual Therapy   Manual Therapy  Soft tissue mobilization    Soft tissue mobilization  manual STW to bil low back paraspinals to decrease pain and tone                  PT Long Term Goals - 10/22/19 1413       PT LONG TERM GOAL #1   Title  Patient will be independent with HEP    Time  6    Period  Weeks    Status  Achieved      PT LONG TERM GOAL #2   Title  Patient will report ability to perform ADLs and lower body dressing with low back pain less than or equal to 2/10.    Time  6    Period  Weeks    Status  On-going   increased pain due to a fall 10/22/19     PT LONG TERM GOAL #3   Title  Patient will demonstrate proper bed mobility and log rolling technique to protect spine during transitional movements.    Time  6    Period  Weeks    Status  Achieved            Plan - 10/22/19 1421    Clinical Impression Statement  Patient tolerated treatment fair due to increased pain ever since his fall a moth ago. Patient feels more pain with certain movements and prolong walking and less at rest. Patient gets fatigue quickly and requires rest with ADL's. Patient had palpable pain more on left than right low back. Patient goals ongoing due to pain deficts. Patient discussed with PT to perform recert for re-cert.    Personal Factors and Comorbidities  Comorbidity 1    Comorbidities  lumbar laminectomy L4-L5 05/15/2019; left TKA, HTN    Examination-Activity Limitations  Other;Stand    Examination-Participation Restrictions  Other    Stability/Clinical Decision Making  Evolving/Moderate complexity    Rehab Potential  Good    PT Frequency  2x / week    PT Duration  6 weeks    PT Treatment/Interventions  ADLs/Self Care Home Management;Cryotherapy;Electrical Stimulation;Ultrasound;Traction;Moist Heat;Therapeutic activities;Therapeutic exercise;Manual techniques;Patient/family education;Passive range of motion;Dry needling;Spinal Manipulations    PT Next Visit Plan  cont with POC  sent for re-cert awaitng signature    Consulted and Agree with Plan of Care  Patient       Patient will benefit from skilled therapeutic intervention in order to improve the following deficits and impairments:  Pain,  Decreased mobility, Decreased activity tolerance, Decreased range of motion, Postural dysfunction  Visit Diagnosis: Chronic bilateral low back pain with left-sided sciatica  Muscle weakness (generalized)  Chronic bilateral low back pain without sciatica     Problem List Patient Active Problem  List   Diagnosis Date Noted  . Fall 09/27/2019  . Hypertension   . Benign prostatic hyperplasia   . Chronic back pain   . Fall at home, initial encounter   . Temporal bone fracture (HCC)   . SDH (subdural hematoma) (HCC)   . Marijuana abuse   . Alcohol abuse   . MDD (major depressive disorder) 09/18/2013  . Opiate dependence (HCC) 09/18/2013    Cathie Hoops, PTA 10/22/19 7:04 PM [facility] [address] [place], [place], [address] Phone: [phone]   Fax:  [phone]  Name: David Carroll MRN: [id number] Date of Birth: [birth year]

## 2019-10-28 ENCOUNTER — Encounter: Payer: Self-pay | Admitting: Physical Therapy

## 2019-10-28 ENCOUNTER — Ambulatory Visit: Payer: Medicare Other | Admitting: Physical Therapy

## 2019-10-28 ENCOUNTER — Other Ambulatory Visit: Payer: Self-pay

## 2019-10-28 DIAGNOSIS — M5442 Lumbago with sciatica, left side: Secondary | ICD-10-CM | POA: Diagnosis not present

## 2019-10-28 DIAGNOSIS — G8929 Other chronic pain: Secondary | ICD-10-CM

## 2019-10-28 DIAGNOSIS — M6281 Muscle weakness (generalized): Secondary | ICD-10-CM

## 2019-10-28 DIAGNOSIS — M545 Low back pain, unspecified: Secondary | ICD-10-CM

## 2019-10-28 NOTE — Therapy (Signed)
[facility] [place], [place], [address] Phone: [phone]   Fax:  [phone]  Physical Therapy Treatment  Patient Details  Name: David Carroll MRN: [id number] Date of Birth: [birth year] Referring Provider (PT): Milas Kocher, NP   Encounter Date: 10/28/2019  PT End of Session - 10/28/19 1436    Visit Number  18    Number of Visits  24    Date for PT Re-Evaluation  11/28/19    Authorization Type  FOTO 171h visit 42% limitation; PROGRESS NOTE AT 10TH VISIT.  KX MODIFIER AFTER 15 VISITS.    PT Start Time  1430    PT Stop Time  1521    PT Time Calculation (min)  51 min    Activity Tolerance  Patient tolerated treatment well    Behavior During Therapy  WFL for tasks assessed/performed       Past Medical History:  Diagnosis Date  . Arthritis    KNEES  . BPH (benign prostatic hypertrophy)   . Complication of anesthesia SLOW RECOVERING FROM ANES. DRUGS   HX OPIATE ADDICTION-- PT ON SUBOXONE  . Depression   . Glaucoma   . History of gastric ulcer   . History of gastroesophageal reflux (GERD)   . History of kidney stones   . Hydronephrosis, left   . Hypertension   . Nephrolithiasis   . Opiate addiction ([place])   . Urgency of urination     Past Surgical History:  Procedure Laterality Date  . CATARACT EXTRACTION W/ INTRAOCULAR LENS  IMPLANT, BILATERAL    . CYSTOSCOPY WITH BIOPSY Left 11/06/2012   Procedure: CYSTOSCOPY WITH BIOPSY;  Surgeon: Alexis Frock, MD;  Location: [facility];  Service: Urology;  Laterality: Left;  . EXTRACORPOREAL SHOCK WAVE LITHOTRIPSY Left 03-06-2011  . HOLMIUM LASER APPLICATION Left 8/52/7782   Procedure: HOLMIUM LASER APPLICATION;  Surgeon: Alexis Frock, MD;  Location: [facility];  Service: Urology;  Laterality: Left;  . INGUINAL HERNIA REPAIR Bilateral 1970   RE-DO LEFT 1985  . LUMBAR DISC SURGERY  09-24-2008   L3 -- L4  . LUMBAR LAMINECTOMY   05/15/2019   with partial facet joint removal  . MEDIAL COMPARTMENT ARTHROPLASTY, LEFT KNEE  02-22-2010  . REMOVAL BENIGN MASS LEFT LUNG  1977  . VAGOTOMY  1986   ULCERS    There were no vitals filed for this visit.  Subjective Assessment - 10/28/19 1432    Subjective  COVID-19 screening performed upon arrival. Patient reports lumbar burning today and states that he is to see a nuerologist. No skull fractures per MRI.    Pertinent History  Lumbar laminectomy L4-L5 05/15/2019, Left TKA, HTN    Limitations  House hold activities;Walking    How long can you stand comfortably?  20 minutes.    How long can you walk comfortably?  Short community distances.    Patient Stated Goals  improve mobility and return to exercise routine at the [facility]    Currently in Pain?  Yes    Pain Score  5     Pain Location  Back    Pain Orientation  Lower    Pain Descriptors / Indicators  Burning    Pain Type  Chronic pain    Pain Onset  More than a month ago    Pain Frequency  Constant         [facility] PT Assessment - 10/28/19 0001      Assessment   Medical Diagnosis  Disc Degeneration, lumbar    Referring Provider (PT)  Rueben Bash, NP    Onset Date/Surgical Date  05/15/19    Next MD Visit  none    Prior Therapy  yes      Precautions   Precautions  Fall;Back    Precaution Comments  No bending, twisting, and liftinger greater than 15 lb                    [facility] Adult PT Treatment/Exercise - 10/28/19 0001      Lumbar Exercises: Aerobic   Nustep  L3 x10 min      Modalities   Modalities  Electrical Stimulation;Moist Heat;Ultrasound      Moist Heat Therapy   Number Minutes Moist Heat  15 Minutes    Moist Heat Location  Lumbar Spine      Electrical Stimulation   Electrical Stimulation Location  B low back    Electrical Stimulation Action  IFC    Electrical Stimulation Parameters  80-150 hz x15 min    Electrical Stimulation Goals  Pain      Ultrasound   Ultrasound  Location  B lumbar paraspinals    Ultrasound Parameters  1.5 w/cm2, 100%, 1 mhz x10 min    Ultrasound Goals  Pain      Manual Therapy   Manual Therapy  Soft tissue mobilization    Soft tissue mobilization  STW of B lumbar paraspinals, QL to reduce pain                  PT Long Term Goals - 10/22/19 1413      PT LONG TERM GOAL #1   Title  Patient will be independent with HEP    Time  6    Period  Weeks    Status  Achieved      PT LONG TERM GOAL #2   Title  Patient will report ability to perform ADLs and lower body dressing with low back pain less than or equal to 2/10.    Time  6    Period  Weeks    Status  On-going   increased pain due to a fall 10/22/19     PT LONG TERM GOAL #3   Title  Patient will demonstrate proper bed mobility and log rolling technique to protect spine during transitional movements.    Time  6    Period  Weeks    Status  Achieved            Plan - 10/28/19 1513    Clinical Impression Statement  Patient presented in clinic with reports of increased LBP and tightness since fall in April 2021 which required ED visit. Patient also reports more hearing difficulties as well as fatigue since fall. Patient able to tolerate short session on Nustep well with no complaints. Minimal muscle tightness palpable in B lumbar paraspinals, QL region. Slow transition movements observed for sit> SL or SL > supine. Normal modalities response noted following removal of the modalities.    Personal Factors and Comorbidities  Comorbidity 1    Comorbidities  lumbar laminectomy L4-L5 05/15/2019; left TKA, HTN    Examination-Activity Limitations  Other;Stand    Examination-Participation Restrictions  Other    Stability/Clinical Decision Making  Evolving/Moderate complexity    Rehab Potential  Good    PT Frequency  2x / week    PT Duration  6 weeks    PT Treatment/Interventions  ADLs/Self Care Home Management;Cryotherapy;Electrical  Stimulation;Ultrasound;Traction;Moist  Heat;Therapeutic activities;Therapeutic exercise;Manual techniques;Patient/family education;Passive range of motion;Dry needling;Spinal Manipulations    PT Next Visit Plan  cont with POC  sent for re-cert awaitng signature    PT Home Exercise Plan  see patient education section    Consulted and Agree with Plan of Care  Patient       Patient will benefit from skilled therapeutic intervention in order to improve the following deficits and impairments:  Pain, Decreased mobility, Decreased activity tolerance, Decreased range of motion, Postural dysfunction  Visit Diagnosis: Chronic bilateral low back pain with left-sided sciatica  Muscle weakness (generalized)  Chronic bilateral low back pain without sciatica     Problem List Patient Active Problem List   Diagnosis Date Noted  . Fall 09/27/2019  . Hypertension   . Benign prostatic hyperplasia   . Chronic back pain   . Fall at home, initial encounter   . Temporal bone fracture ([facility])   . SDH (subdural hematoma) (HCC)   . Marijuana abuse   . Alcohol abuse   . MDD (major depressive disorder) 09/18/2013  . Opiate dependence (HCC) 09/18/2013    Marvell Fuller, PTA 10/28/2019, 3:27 PM  [facility] [address] [place], [place], [address] Phone: [phone]   Fax:  [phone]  Name: Bobby Barton MRN: [id number] Date of Birth: 1940/08/12

## 2019-10-30 ENCOUNTER — Ambulatory Visit: Payer: Medicare Other | Admitting: Physical Therapy

## 2019-11-04 ENCOUNTER — Ambulatory Visit: Payer: Medicare Other | Admitting: Physical Therapy

## 2019-11-04 ENCOUNTER — Encounter: Payer: Self-pay | Admitting: Physical Therapy

## 2019-11-04 DIAGNOSIS — M6281 Muscle weakness (generalized): Secondary | ICD-10-CM

## 2019-11-04 DIAGNOSIS — G8929 Other chronic pain: Secondary | ICD-10-CM

## 2019-11-04 DIAGNOSIS — M5442 Lumbago with sciatica, left side: Secondary | ICD-10-CM | POA: Diagnosis not present

## 2019-11-04 NOTE — Therapy (Signed)
[facility] [address] [place], [place], [address] Phone: [phone]   Fax:  [phone]  Physical Therapy Treatment  Patient Details  Name: David Carroll MRN: [id number] Date of Birth: 07/29/1940 Referring Provider (PT): Rueben Bash, NP   Encounter Date: 11/04/2019  PT End of Session - 11/04/19 1509    Visit Number  19    Number of Visits  24    Date for PT Re-Evaluation  11/28/19    Authorization Type  FOTO 171h visit 42% limitation; PROGRESS NOTE AT 10TH VISIT.  KX MODIFIER AFTER 15 VISITS.    PT Start Time  0145    PT Stop Time  0243    PT Time Calculation (min)  58 min    Activity Tolerance  Patient tolerated treatment well    Behavior During Therapy  WFL for tasks assessed/performed       Past Medical History:  Diagnosis Date  . Arthritis    KNEES  . BPH (benign prostatic hypertrophy)   . Complication of anesthesia SLOW RECOVERING FROM ANES. DRUGS   HX OPIATE ADDICTION-- PT ON SUBOXONE  . Depression   . Glaucoma   . History of gastric ulcer   . History of gastroesophageal reflux (GERD)   . History of kidney stones   . Hydronephrosis, left   . Hypertension   . Nephrolithiasis   . Opiate addiction ([facility])   . Urgency of urination     Past Surgical History:  Procedure Laterality Date  . CATARACT EXTRACTION W/ INTRAOCULAR LENS  IMPLANT, BILATERAL    . CYSTOSCOPY WITH BIOPSY Left 11/06/2012   Procedure: CYSTOSCOPY WITH BIOPSY;  Surgeon: Sebastian Ache, MD;  Location: [facility];  Service: Urology;  Laterality: Left;  . EXTRACORPOREAL SHOCK WAVE LITHOTRIPSY Left 03-06-2011  . HOLMIUM LASER APPLICATION Left 11/06/2012   Procedure: HOLMIUM LASER APPLICATION;  Surgeon: Sebastian Ache, MD;  Location: [facility];  Service: Urology;  Laterality: Left;  . INGUINAL HERNIA REPAIR Bilateral 1970   RE-DO LEFT 1985  . LUMBAR DISC SURGERY  09-24-2008   L3 -- L4  . LUMBAR LAMINECTOMY   05/15/2019   with partial facet joint removal  . MEDIAL COMPARTMENT ARTHROPLASTY, LEFT KNEE  02-22-2010  . REMOVAL BENIGN MASS LEFT LUNG  1977  . VAGOTOMY  1986   ULCERS    There were no vitals filed for this visit.  Subjective Assessment - 11/04/19 1506    Subjective  COVID-19 screen performed prior to patient entering clinic.  Left low back and hip hurting today.    Pertinent History  Lumbar laminectomy L4-L5 05/15/2019, Left TKA, HTN    Limitations  House hold activities;Walking    How long can you stand comfortably?  20 minutes.    How long can you walk comfortably?  Short community distances.    Patient Stated Goals  improve mobility and return to exercise routine at the [facility]    Currently in Pain?  Yes    Pain Score  5     Pain Location  --   Left low back and hip.   Pain Descriptors / Indicators  Aching    Pain Type  Chronic pain    Pain Onset  More than a month ago                        [facility] Adult PT Treatment/Exercise - 11/04/19 0001      Exercises   Exercises  Knee/Hip      Lumbar Exercises: Aerobic   Nustep  Level 3 x 10 minutes.      Modalities   Modalities  Electrical Stimulation;Moist Heat      Moist Heat Therapy   Number Minutes Moist Heat  20 Minutes    Moist Heat Location  Lumbar Spine      Electrical Stimulation   Electrical Stimulation Location  Left LB/Hip.    Electrical Stimulation Action  IFC    Electrical Stimulation Parameters  80-150 Hz x 20 minutes.    Electrical Stimulation Goals  Pain      Manual Therapy   Manual Therapy  Soft tissue mobilization    Soft tissue mobilization  RT sdly position with folded pillow between knees for comfort:  STW/M x 13 minutes to patient's left low back and hip region.                  PT Long Term Goals - 10/22/19 1413      PT LONG TERM GOAL #1   Title  Patient will be independent with HEP    Time  6    Period  Weeks    Status  Achieved      PT LONG TERM GOAL #2   Title   Patient will report ability to perform ADLs and lower body dressing with low back pain less than or equal to 2/10.    Time  6    Period  Weeks    Status  On-going   increased pain due to a fall 10/22/19     PT LONG TERM GOAL #3   Title  Patient will demonstrate proper bed mobility and log rolling technique to protect spine during transitional movements.    Time  6    Period  Weeks    Status  Achieved            Plan - 11/04/19 1509    Clinical Impression Statement  The patient was palpably tender posterior to his left greater trochanter today.  He went to a Neurologist and reports "no brain damage" from his fall.    Personal Factors and Comorbidities  Comorbidity 1    Comorbidities  lumbar laminectomy L4-L5 05/15/2019; left TKA, HTN    Examination-Activity Limitations  Other;Stand    Examination-Participation Restrictions  Other    Stability/Clinical Decision Making  Evolving/Moderate complexity    Rehab Potential  Good    PT Frequency  2x / week    PT Duration  6 weeks    PT Treatment/Interventions  ADLs/Self Care Home Management;Cryotherapy;Electrical Stimulation;Ultrasound;Traction;Moist Heat;Therapeutic activities;Therapeutic exercise;Manual techniques;Patient/family education;Passive range of motion;Dry needling;Spinal Manipulations    PT Next Visit Plan  cont with POC  sent for re-cert awaitng signature    PT Home Exercise Plan  see patient education section    Consulted and Agree with Plan of Care  Patient       Patient will benefit from skilled therapeutic intervention in order to improve the following deficits and impairments:  Pain, Decreased mobility, Decreased activity tolerance, Decreased range of motion, Postural dysfunction  Visit Diagnosis: Chronic bilateral low back pain with left-sided sciatica  Muscle weakness (generalized)  Chronic bilateral low back pain without sciatica     Problem List Patient Active Problem List   Diagnosis Date Noted  . Fall  09/27/2019  . Hypertension   . Benign prostatic hyperplasia   . Chronic back pain   . Fall at home, initial encounter   .  Temporal bone fracture ([place])   . SDH (subdural hematoma) ([place])   . Marijuana abuse   . Alcohol abuse   . MDD (major depressive disorder) 09/18/2013  . Opiate dependence ([place]) 09/18/2013    Paul Trettin, [place] MPT 11/04/2019, 3:12 PM  [facility] [address] [place], [place], [address] Phone: [phone]   Fax:  [phone]  Name: David Carroll MRN: [id number] Date of Birth: [birth year]

## 2019-11-06 ENCOUNTER — Ambulatory Visit: Payer: Medicare Other | Admitting: Physical Therapy

## 2019-11-06 ENCOUNTER — Other Ambulatory Visit: Payer: Self-pay

## 2019-11-06 ENCOUNTER — Encounter: Payer: Self-pay | Admitting: Physical Therapy

## 2019-11-06 DIAGNOSIS — M5442 Lumbago with sciatica, left side: Secondary | ICD-10-CM | POA: Diagnosis not present

## 2019-11-06 DIAGNOSIS — M545 Low back pain, unspecified: Secondary | ICD-10-CM

## 2019-11-06 DIAGNOSIS — G8929 Other chronic pain: Secondary | ICD-10-CM

## 2019-11-06 DIAGNOSIS — M6281 Muscle weakness (generalized): Secondary | ICD-10-CM

## 2019-11-06 NOTE — Therapy (Signed)
[facility] [address] [place], [place], [address] Phone: [phone]   Fax:  [phone]  Physical Therapy Treatment  Progress Note Reporting Period 08/04/2019 to 11/06/2019  See note below for Objective Data and Assessment of Progress/Goals. Minimal progress made secondary to fall and pain. Guss Bunde, PT, DPT   Patient Details  Name: David Carroll MRN: [id number] Date of Birth: 10-30-1940 Referring Provider (PT): Rueben Bash, NP   Encounter Date: 11/06/2019  PT End of Session - 11/06/19 1532    Visit Number  20    Number of Visits  24    Date for PT Re-Evaluation  11/28/19    Authorization Type  FOTO 171h visit 42% limitation; PROGRESS NOTE AT 10TH VISIT.  KX MODIFIER AFTER 15 VISITS.    PT Start Time  1516    PT Stop Time  1603    PT Time Calculation (min)  47 min    Activity Tolerance  Patient tolerated treatment well    Behavior During Therapy  WFL for tasks assessed/performed       Past Medical History:  Diagnosis Date  . Arthritis    KNEES  . BPH (benign prostatic hypertrophy)   . Complication of anesthesia SLOW RECOVERING FROM ANES. DRUGS   HX OPIATE ADDICTION-- PT ON SUBOXONE  . Depression   . Glaucoma   . History of gastric ulcer   . History of gastroesophageal reflux (GERD)   . History of kidney stones   . Hydronephrosis, left   . Hypertension   . Nephrolithiasis   . Opiate addiction ([facility])   . Urgency of urination     Past Surgical History:  Procedure Laterality Date  . CATARACT EXTRACTION W/ INTRAOCULAR LENS  IMPLANT, BILATERAL    . CYSTOSCOPY WITH BIOPSY Left 11/06/2012   Procedure: CYSTOSCOPY WITH BIOPSY;  Surgeon: Sebastian Ache, MD;  Location: [facility];  Service: Urology;  Laterality: Left;  . EXTRACORPOREAL SHOCK WAVE LITHOTRIPSY Left 03-06-2011  . HOLMIUM LASER APPLICATION Left 11/06/2012   Procedure: HOLMIUM LASER APPLICATION;  Surgeon: Sebastian Ache, MD;   Location: [facility];  Service: Urology;  Laterality: Left;  . INGUINAL HERNIA REPAIR Bilateral 1970   RE-DO LEFT 1985  . LUMBAR DISC SURGERY  09-24-2008   L3 -- L4  . LUMBAR LAMINECTOMY  05/15/2019   with partial facet joint removal  . MEDIAL COMPARTMENT ARTHROPLASTY, LEFT KNEE  02-22-2010  . REMOVAL BENIGN MASS LEFT LUNG  1977  . VAGOTOMY  1986   ULCERS    There were no vitals filed for this visit.  Subjective Assessment - 11/06/19 1522    Subjective  COVID-19 screen performed prior to patient entering clinic.  Reports a lot of pain in B hips today but did some work outside. Patient states that he is tired of sitting inside though    Pertinent History  Lumbar laminectomy L4-L5 05/15/2019, Left TKA, HTN    Limitations  House hold activities;Walking    How long can you stand comfortably?  20 minutes.    How long can you walk comfortably?  Short community distances.    Patient Stated Goals  improve mobility and return to exercise routine at the [facility]    Currently in Pain?  Yes    Pain Score  --   No pain score provided   Pain Location  Back    Pain Orientation  Right;Left    Pain Descriptors / Indicators  Discomfort    Pain Type  Chronic pain    Pain Onset  More than a month ago    Pain Frequency  Intermittent    Aggravating Factors   Movement         [facility] PT Assessment - 11/06/19 0001      Assessment   Medical Diagnosis  Disc Degeneration, lumbar    Referring Provider (PT)  Milas Kocher, NP    Onset Date/Surgical Date  05/15/19    Next MD Visit  none    Prior Therapy  yes      Precautions   Precautions  Fall;Back    Precaution Comments  No bending, twisting, and liftinger greater than 15 lb                    [facility] Adult PT Treatment/Exercise - 11/06/19 0001      Lumbar Exercises: Aerobic   Nustep  Level 3 x 10 minutes.      Lumbar Exercises: Supine   Ab Set  10 reps;5 seconds    Glut Set  10 reps;5 seconds    Clam  15  reps;3 seconds    Clam Limitations  yellow theraband    Bridge  10 reps;3 seconds    Other Supine Lumbar Exercises  Adductor squeeze x10 reps 5 sec holds      Modalities   Modalities  Electrical Stimulation;Moist Heat      Moist Heat Therapy   Number Minutes Moist Heat  15 Minutes    Moist Heat Location  Lumbar Spine      Electrical Stimulation   Electrical Stimulation Location  B low back/hips    Electrical Stimulation Action  IFC    Electrical Stimulation Parameters  80-150 hz x15 min    Electrical Stimulation Goals  Pain      Manual Therapy   Manual Therapy  Soft tissue mobilization    Soft tissue mobilization  STW to B superior glutes, B lumbar paraspinals, R QL to reduce pain                  PT Long Term Goals - 10/22/19 1413      PT LONG TERM GOAL #1   Title  Patient will be independent with HEP    Time  6    Period  Weeks    Status  Achieved      PT LONG TERM GOAL #2   Title  Patient will report ability to perform ADLs and lower body dressing with low back pain less than or equal to 2/10.    Time  6    Period  Weeks    Status  On-going   increased pain due to a fall 10/22/19     PT LONG TERM GOAL #3   Title  Patient will demonstrate proper bed mobility and log rolling technique to protect spine during transitional movements.    Time  6    Period  Weeks    Status  Achieved            Plan - 11/06/19 1605    Clinical Impression Statement  Patient presented in clinic with reports of B hip discomfort after bending more while doing yardwork. Patient consented to low level and minimal exercise for strengthening. Patient reported only discomfort with bridge reps completed. Patient very tender to STW of B glutes and lumbar paraspinals. Normal modalities response noted following removal of the modalties.    Personal Factors and Comorbidities  Comorbidity 1  Comorbidities  lumbar laminectomy L4-L5 05/15/2019; left TKA, HTN    Examination-Activity  Limitations  Other;Stand    Examination-Participation Restrictions  Other    Stability/Clinical Decision Making  Evolving/Moderate complexity    Rehab Potential  Good    PT Frequency  2x / week    PT Duration  6 weeks    PT Treatment/Interventions  ADLs/Self Care Home Management;Cryotherapy;Electrical Stimulation;Ultrasound;Traction;Moist Heat;Therapeutic activities;Therapeutic exercise;Manual techniques;Patient/family education;Passive range of motion;Dry needling;Spinal Manipulations    PT Next Visit Plan  cont with POC  sent for re-cert awaitng signature    PT Home Exercise Plan  see patient education section    Consulted and Agree with Plan of Care  Patient       Patient will benefit from skilled therapeutic intervention in order to improve the following deficits and impairments:  Pain, Decreased mobility, Decreased activity tolerance, Decreased range of motion, Postural dysfunction  Visit Diagnosis: Chronic bilateral low back pain with left-sided sciatica  Muscle weakness (generalized)  Chronic bilateral low back pain without sciatica     Problem List Patient Active Problem List   Diagnosis Date Noted  . Fall 09/27/2019  . Hypertension   . Benign prostatic hyperplasia   . Chronic back pain   . Fall at home, initial encounter   . Temporal bone fracture (HCC)   . SDH (subdural hematoma) (HCC)   . Marijuana abuse   . Alcohol abuse   . MDD (major depressive disorder) 09/18/2013  . Opiate dependence (HCC) 09/18/2013    Marvell Fuller, PTA 11/06/2019, 4:24 PM  [facility] [address] [place], [place], [address] Phone: [phone]   Fax:  [phone]  Name: Einar Nolasco MRN: [id number] Date of Birth: 1940/12/22

## 2019-11-11 ENCOUNTER — Ambulatory Visit: Payer: Medicare Other | Attending: Nurse Practitioner | Admitting: Physical Therapy

## 2019-11-11 ENCOUNTER — Other Ambulatory Visit: Payer: Self-pay

## 2019-11-11 DIAGNOSIS — M5442 Lumbago with sciatica, left side: Secondary | ICD-10-CM | POA: Diagnosis present

## 2019-11-11 DIAGNOSIS — G8929 Other chronic pain: Secondary | ICD-10-CM

## 2019-11-11 DIAGNOSIS — M545 Low back pain: Secondary | ICD-10-CM | POA: Diagnosis present

## 2019-11-11 DIAGNOSIS — M6281 Muscle weakness (generalized): Secondary | ICD-10-CM | POA: Diagnosis present

## 2019-11-11 NOTE — Therapy (Signed)
[facility] [place], [place], [address] Phone: [phone]   Fax:  [phone]  Physical Therapy Treatment  Patient Details  Name: David Carroll MRN: [id number] Date of Birth: [birth year] Referring Provider (PT): Milas Kocher, NP   Encounter Date: 11/11/2019  PT End of Session - 11/11/19 1125    Visit Number  21    Number of Visits  24    Date for PT Re-Evaluation  11/28/19    Authorization Type  FOTO 171h visit 42% limitation; PROGRESS NOTE AT 10TH VISIT.  KX MODIFIER AFTER 15 VISITS.    PT Start Time  1125   Late arrival to treatment area.   PT Stop Time  1202    PT Time Calculation (min)  37 min       Past Medical History:  Diagnosis Date   Arthritis    KNEES   BPH (benign prostatic hypertrophy)    Complication of anesthesia SLOW RECOVERING FROM ANES. DRUGS   HX OPIATE ADDICTION-- PT ON SUBOXONE   Depression    Glaucoma    History of gastric ulcer    History of gastroesophageal reflux (GERD)    History of kidney stones    Hydronephrosis, left    Hypertension    Nephrolithiasis    Opiate addiction ([place])    Urgency of urination     Past Surgical History:  Procedure Laterality Date   CATARACT EXTRACTION W/ INTRAOCULAR LENS  IMPLANT, BILATERAL     CYSTOSCOPY WITH BIOPSY Left 11/06/2012   Procedure: CYSTOSCOPY WITH BIOPSY;  Surgeon: Alexis Frock, MD;  Location: [facility];  Service: Urology;  Laterality: Left;   EXTRACORPOREAL SHOCK WAVE LITHOTRIPSY Left 03-06-2011   HOLMIUM LASER APPLICATION Left 06/14/7251   Procedure: HOLMIUM LASER APPLICATION;  Surgeon: Alexis Frock, MD;  Location: [name];  Service: Urology;  Laterality: Left;   INGUINAL HERNIA REPAIR Bilateral 1970   RE-DO [place] SURGERY  09-24-2008   L3 -- L4   LUMBAR LAMINECTOMY  05/15/2019   with partial facet joint removal   MEDIAL COMPARTMENT ARTHROPLASTY, LEFT  KNEE  02-22-2010   REMOVAL BENIGN MASS LEFT LUNG  1977   VAGOTOMY  1986   ULCERS    There were no vitals filed for this visit.  Subjective Assessment - 11/11/19 1134    Subjective  COVID-19 screen performed prior to patient entering clinic.  Back is better today.  Walked a lot yesterday.    Pertinent History  Lumbar laminectomy L4-L5 05/15/2019, Left TKA, HTN    Limitations  House hold activities;Walking    How long can you stand comfortably?  20 minutes.    How long can you walk comfortably?  Short community distances.    Patient Stated Goals  improve mobility and return to exercise routine at the [facility]    Currently in Pain?  Yes    Pain Score  2     Pain Location  Back    Pain Orientation  Left    Pain Descriptors / Indicators  Dull                        [facility] Adult PT Treatment/Exercise - 11/11/19 0001      Exercises   Exercises  Knee/Hip      Lumbar Exercises: Aerobic   Nustep  Level 4 x 15 minutes.      Modalities   Modalities  Electrical Stimulation;Moist Heat  Moist Heat Therapy   Number Minutes Moist Heat  15 Minutes    Moist Heat Location  Lumbar Spine      Electrical Stimulation   Electrical Stimulation Location  Left LB/upper glut.    Electrical Stimulation Action  IFC    Electrical Stimulation Parameters  80-150 Hz x 15 minutes.    Electrical Stimulation Goals  Tone;Pain                  PT Long Term Goals - 10/22/19 1413      PT LONG TERM GOAL #1   Title  Patient will be independent with HEP    Time  6    Period  Weeks    Status  Achieved      PT LONG TERM GOAL #2   Title  Patient will report ability to perform ADLs and lower body dressing with low back pain less than or equal to 2/10.    Time  6    Period  Weeks    Status  On-going   increased pain due to a fall 10/22/19     PT LONG TERM GOAL #3   Title  Patient will demonstrate proper bed mobility and log rolling technique to protect spine during transitional  movements.    Time  6    Period  Weeks    Status  Achieved            Plan - 11/11/19 1155    Clinical Impression Statement  Patient doing very well today.  He tolerated an increase in time and intensity on the Nustep today without difficulty or complaint.    Personal Factors and Comorbidities  Comorbidity 1    Comorbidities  lumbar laminectomy L4-L5 05/15/2019; left TKA, HTN    Examination-Activity Limitations  Other;Stand    Examination-Participation Restrictions  Other    Stability/Clinical Decision Making  Evolving/Moderate complexity    Rehab Potential  Good    PT Frequency  2x / week    PT Duration  6 weeks    PT Treatment/Interventions  ADLs/Self Care Home Management;Cryotherapy;Electrical Stimulation;Ultrasound;Traction;Moist Heat;Therapeutic activities;Therapeutic exercise;Manual techniques;Patient/family education;Passive range of motion;Dry needling;Spinal Manipulations    PT Next Visit Plan  cont with POC  sent for re-cert awaitng signature    PT Home Exercise Plan  see patient education section    Consulted and Agree with Plan of Care  Patient       Patient will benefit from skilled therapeutic intervention in order to improve the following deficits and impairments:  Pain, Decreased mobility, Decreased activity tolerance, Decreased range of motion, Postural dysfunction  Visit Diagnosis: Chronic bilateral low back pain with left-sided sciatica  Muscle weakness (generalized)  Chronic bilateral low back pain without sciatica     Problem List Patient Active Problem List   Diagnosis Date Noted   Fall 09/27/2019   Hypertension    Benign prostatic hyperplasia    Chronic back pain    Fall at home, initial encounter    Temporal bone fracture (HCC)    SDH (subdural hematoma) (HCC)    Marijuana abuse    Alcohol abuse    MDD (major depressive disorder) 09/18/2013   Opiate dependence (HCC) 09/18/2013    Allyah Heather, [place] MPT 11/11/2019, 12:02 PM  [facility] [address] [place], [place], [address] Phone: [phone]   Fax:  [phone]  Name: David Carroll MRN: [id number] Date of Birth: 1940-09-15

## 2019-11-13 ENCOUNTER — Other Ambulatory Visit: Payer: Self-pay

## 2019-11-13 ENCOUNTER — Encounter: Payer: Self-pay | Admitting: Physical Therapy

## 2019-11-13 ENCOUNTER — Ambulatory Visit: Payer: Medicare Other | Admitting: Physical Therapy

## 2019-11-13 DIAGNOSIS — M545 Low back pain, unspecified: Secondary | ICD-10-CM

## 2019-11-13 DIAGNOSIS — M5442 Lumbago with sciatica, left side: Secondary | ICD-10-CM

## 2019-11-13 DIAGNOSIS — M6281 Muscle weakness (generalized): Secondary | ICD-10-CM

## 2019-11-13 DIAGNOSIS — G8929 Other chronic pain: Secondary | ICD-10-CM

## 2019-11-13 NOTE — Therapy (Signed)
[facility] [address] [place], [place], [address] Phone: [phone]   Fax:  [phone]  Physical Therapy Treatment  Patient Details  Name: David Carroll MRN: [id number] Date of Birth: 1941/03/11 Referring Provider (PT): Rueben Bash, NP   Encounter Date: 11/13/2019  PT End of Session - 11/13/19 1407    Visit Number  22    Number of Visits  24    Date for PT Re-Evaluation  11/28/19    Authorization Type  FOTO 171h visit 42% limitation; PROGRESS NOTE AT 10TH VISIT.  KX MODIFIER AFTER 15 VISITS.    PT Start Time  0159   Late arrival to treatment area.   PT Stop Time  0234    PT Time Calculation (min)  35 min    Activity Tolerance  Patient tolerated treatment well    Behavior During Therapy  WFL for tasks assessed/performed       Past Medical History:  Diagnosis Date  . Arthritis    KNEES  . BPH (benign prostatic hypertrophy)   . Complication of anesthesia SLOW RECOVERING FROM ANES. DRUGS   HX OPIATE ADDICTION-- PT ON SUBOXONE  . Depression   . Glaucoma   . History of gastric ulcer   . History of gastroesophageal reflux (GERD)   . History of kidney stones   . Hydronephrosis, left   . Hypertension   . Nephrolithiasis   . Opiate addiction (HCC)   . Urgency of urination     Past Surgical History:  Procedure Laterality Date  . CATARACT EXTRACTION W/ INTRAOCULAR LENS  IMPLANT, BILATERAL    . CYSTOSCOPY WITH BIOPSY Left 11/06/2012   Procedure: CYSTOSCOPY WITH BIOPSY;  Surgeon: Sebastian Ache, MD;  Location: [facility];  Service: Urology;  Laterality: Left;  . EXTRACORPOREAL SHOCK WAVE LITHOTRIPSY Left 03-06-2011  . HOLMIUM LASER APPLICATION Left 11/06/2012   Procedure: HOLMIUM LASER APPLICATION;  Surgeon: Sebastian Ache, MD;  Location: [facility];  Service: Urology;  Laterality: Left;  . INGUINAL HERNIA REPAIR Bilateral 1970   RE-DO LEFT 1985  . LUMBAR DISC SURGERY  09-24-2008   L3 --  L4  . LUMBAR LAMINECTOMY  05/15/2019   with partial facet joint removal  . MEDIAL COMPARTMENT ARTHROPLASTY, LEFT KNEE  02-22-2010  . REMOVAL BENIGN MASS LEFT LUNG  1977  . VAGOTOMY  1986   ULCERS    There were no vitals filed for this visit.  Subjective Assessment - 11/13/19 1406    Subjective  COVID-19 screen performed prior to patient entering clinic.  Little sore from last treatment but feeling good.    Pertinent History  Lumbar laminectomy L4-L5 05/15/2019, Left TKA, HTN    Limitations  House hold activities;Walking    How long can you stand comfortably?  20 minutes.    How long can you walk comfortably?  Short community distances.    Patient Stated Goals  improve mobility and return to exercise routine at the [facility] Adult PT Treatment/Exercise - 11/13/19 0001      Exercises   Exercises  Knee/Hip      Lumbar Exercises: Aerobic   Nustep  Level 3 x 15 minutes.      Programme researcher, broadcasting/film/video Location  Left LB/upper glut region.    Electrical Stimulation Action  IFC    Electrical Stimulation Parameters  80-150 Hz x 15 minutes.    Electrical Stimulation Goals  Tone;Pain                  PT Long Term Goals - 10/22/19 1413      PT LONG TERM GOAL #1   Title  Patient will be independent with HEP    Time  6    Period  Weeks    Status  Achieved      PT LONG TERM GOAL #2   Title  Patient will report ability to perform ADLs and lower body dressing with low back pain less than or equal to 2/10.    Time  6    Period  Weeks    Status  On-going   increased pain due to a fall 10/22/19     PT LONG TERM GOAL #3   Title  Patient will demonstrate proper bed mobility and log rolling technique to protect spine during transitional movements.    Time  6    Period  Weeks    Status  Achieved            Plan - 11/13/19 1421    Clinical Impression Statement  Patient pleased with his progress.  He has  been able to do more around the house and walks more with mininmal LBP reported.    Personal Factors and Comorbidities  Comorbidity 1    Comorbidities  lumbar laminectomy L4-L5 05/15/2019; left TKA, HTN    Examination-Activity Limitations  Other;Stand    Examination-Participation Restrictions  Other    Stability/Clinical Decision Making  Evolving/Moderate complexity    PT Frequency  2x / week    PT Duration  6 weeks    PT Treatment/Interventions  ADLs/Self Care Home Management;Cryotherapy;Electrical Stimulation;Ultrasound;Traction;Moist Heat;Therapeutic activities;Therapeutic exercise;Manual techniques;Patient/family education;Passive range of motion;Dry needling;Spinal Manipulations    PT Next Visit Plan  cont with POC  sent for re-cert awaitng signature    Consulted and Agree with Plan of Care  Patient       Patient will benefit from skilled therapeutic intervention in order to improve the following deficits and impairments:     Visit Diagnosis: Chronic bilateral low back pain with left-sided sciatica  Muscle weakness (generalized)  Chronic bilateral low back pain without sciatica     Problem List Patient Active Problem List   Diagnosis Date Noted  . Fall 09/27/2019  . Hypertension   . Benign prostatic hyperplasia   . Chronic back pain   . Fall at home, initial encounter   . Temporal bone fracture ([place])   . SDH (subdural hematoma) ([place])   . Marijuana abuse   . Alcohol abuse   . MDD (major depressive disorder) 09/18/2013  . Opiate dependence ([place]) 09/18/2013    Sarahy Creedon, [place] MPT 11/13/2019, 2:49 PM  [facility] [address] [place], [place], [address] Phone: [phone]   Fax:  [phone]  Name: Christino Mcglinchey MRN: [id number] Date of Birth: December 17, 1940

## 2019-11-17 ENCOUNTER — Other Ambulatory Visit: Payer: Self-pay

## 2019-11-17 ENCOUNTER — Ambulatory Visit: Payer: Medicare Other | Admitting: Physical Therapy

## 2019-11-17 DIAGNOSIS — M545 Low back pain, unspecified: Secondary | ICD-10-CM

## 2019-11-17 DIAGNOSIS — G8929 Other chronic pain: Secondary | ICD-10-CM

## 2019-11-17 DIAGNOSIS — M6281 Muscle weakness (generalized): Secondary | ICD-10-CM

## 2019-11-17 DIAGNOSIS — M5442 Lumbago with sciatica, left side: Secondary | ICD-10-CM | POA: Diagnosis not present

## 2019-11-17 NOTE — Therapy (Signed)
[facility] [address] [place], [place], [address] Phone: [phone]   Fax:  [phone]  Physical Therapy Treatment  Patient Details  Name: David Carroll MRN: [id number] Date of Birth: 06-17-1940 Referring Provider (PT): Rueben Bash, NP   Encounter Date: 11/17/2019  PT End of Session - 11/17/19 1322    Visit Number  23    Number of Visits  24    Date for PT Re-Evaluation  11/28/19    Authorization Type  FOTO 171h visit 42% limitation; PROGRESS NOTE AT 10TH VISIT.  KX MODIFIER AFTER 15 VISITS.    PT Start Time  0101    PT Stop Time  0138    PT Time Calculation (min)  37 min    Activity Tolerance  Patient tolerated treatment well    Behavior During Therapy  WFL for tasks assessed/performed       Past Medical History:  Diagnosis Date  . Arthritis    KNEES  . BPH (benign prostatic hypertrophy)   . Complication of anesthesia SLOW RECOVERING FROM ANES. DRUGS   HX OPIATE ADDICTION-- PT ON SUBOXONE  . Depression   . Glaucoma   . History of gastric ulcer   . History of gastroesophageal reflux (GERD)   . History of kidney stones   . Hydronephrosis, left   . Hypertension   . Nephrolithiasis   . Opiate addiction (HCC)   . Urgency of urination     Past Surgical History:  Procedure Laterality Date  . CATARACT EXTRACTION W/ INTRAOCULAR LENS  IMPLANT, BILATERAL    . CYSTOSCOPY WITH BIOPSY Left 11/06/2012   Procedure: CYSTOSCOPY WITH BIOPSY;  Surgeon: Sebastian Ache, MD;  Location: [facility];  Service: Urology;  Laterality: Left;  . EXTRACORPOREAL SHOCK WAVE LITHOTRIPSY Left 03-06-2011  . HOLMIUM LASER APPLICATION Left 11/06/2012   Procedure: HOLMIUM LASER APPLICATION;  Surgeon: Sebastian Ache, MD;  Location: [facility];  Service: Urology;  Laterality: Left;  . INGUINAL HERNIA REPAIR Bilateral 1970   RE-DO LEFT 1985  . LUMBAR DISC SURGERY  09-24-2008   L3 -- L4  . LUMBAR LAMINECTOMY   05/15/2019   with partial facet joint removal  . MEDIAL COMPARTMENT ARTHROPLASTY, LEFT KNEE  02-22-2010  . REMOVAL BENIGN MASS LEFT LUNG  1977  . VAGOTOMY  1986   ULCERS    There were no vitals filed for this visit.  Subjective Assessment - 11/17/19 1322    Subjective  COVID-19 screen performed prior to patient entering clinic.  Knees been bothering me.  Back pain in center today.    Pertinent History  Lumbar laminectomy L4-L5 05/15/2019, Left TKA, HTN    Limitations  House hold activities;Walking    How long can you stand comfortably?  20 minutes.    How long can you walk comfortably?  Short community distances.    Patient Stated Goals  improve mobility and return to exercise routine at the [facility]    Currently in Pain?  Yes    Pain Score  3     Pain Location  Back    Pain Orientation  Right;Left;Lower    Pain Descriptors / Indicators  Dull    Pain Type  Chronic pain    Pain Onset  More than a month ago                        [facility] Adult PT Treatment/Exercise - 11/17/19 0001      Exercises  Exercises  Knee/Hip      Lumbar Exercises: Aerobic   Nustep  Level 3 x 15 minutes with a cadence maintained at 80 SPM+.      Modalities   Modalities  Electrical Stimulation;Moist Heat      Moist Heat Therapy   Number Minutes Moist Heat  15 Minutes    Moist Heat Location  Lumbar Spine      Electrical Stimulation   Electrical Stimulation Location  LLower lumbar.    Electrical Stimulation Action  IFC    Electrical Stimulation Parameters  80-150 Hz x 15 minutes.    Electrical Stimulation Goals  Pain                  PT Long Term Goals - 10/22/19 1413      PT LONG TERM GOAL #1   Title  Patient will be independent with HEP    Time  6    Period  Weeks    Status  Achieved      PT LONG TERM GOAL #2   Title  Patient will report ability to perform ADLs and lower body dressing with low back pain less than or equal to 2/10.    Time  6    Period  Weeks     Status  On-going   increased pain due to a fall 10/22/19     PT LONG TERM GOAL #3   Title  Patient will demonstrate proper bed mobility and log rolling technique to protect spine during transitional movements.    Time  6    Period  Weeks    Status  Achieved            Plan - 11/17/19 1325    Clinical Impression Statement  Patietn did an excellent job today and was able to perform Nustep exercise and maintain a SPM pace of 80+.  Intended to add exercise today but the patient wanted to wait as his knees have been bothering him.    Personal Factors and Comorbidities  Comorbidity 1    Comorbidities  lumbar laminectomy L4-L5 05/15/2019; left TKA, HTN    Examination-Activity Limitations  Other;Stand    Examination-Participation Restrictions  Other    Stability/Clinical Decision Making  Evolving/Moderate complexity    Rehab Potential  Good    PT Frequency  2x / week    PT Duration  6 weeks    PT Treatment/Interventions  ADLs/Self Care Home Management;Cryotherapy;Electrical Stimulation;Ultrasound;Traction;Moist Heat;Therapeutic activities;Therapeutic exercise;Manual techniques;Patient/family education;Passive range of motion;Dry needling;Spinal Manipulations    PT Next Visit Plan  cont with POC  sent for re-cert awaitng signature    PT Home Exercise Plan  see patient education section    Consulted and Agree with Plan of Care  Patient       Patient will benefit from skilled therapeutic intervention in order to improve the following deficits and impairments:  Pain, Decreased mobility, Decreased activity tolerance, Decreased range of motion, Postural dysfunction  Visit Diagnosis: Chronic bilateral low back pain with left-sided sciatica  Muscle weakness (generalized)  Chronic bilateral low back pain without sciatica     Problem List Patient Active Problem List   Diagnosis Date Noted  . Fall 09/27/2019  . Hypertension   . Benign prostatic hyperplasia   . Chronic back pain   .  Fall at home, initial encounter   . Temporal bone fracture ([place])   . SDH (subdural hematoma) ([place])   . Marijuana abuse   . Alcohol abuse   .  MDD (major depressive disorder) 09/18/2013  . Opiate dependence (HCC) 09/18/2013    Quinta Eimer, [place] MPT 11/17/2019, 1:39 PM  [facility] [address] [place], [place], [address] Phone: [phone]   Fax:  [phone]  Name: Jaceyon Strole MRN: [id number] Date of Birth: [birth year]

## 2019-11-20 ENCOUNTER — Other Ambulatory Visit: Payer: Self-pay

## 2019-11-20 ENCOUNTER — Ambulatory Visit: Payer: Medicare Other | Admitting: Physical Therapy

## 2019-11-20 ENCOUNTER — Encounter: Payer: Self-pay | Admitting: Physical Therapy

## 2019-11-20 DIAGNOSIS — M5442 Lumbago with sciatica, left side: Secondary | ICD-10-CM | POA: Diagnosis not present

## 2019-11-20 DIAGNOSIS — M6281 Muscle weakness (generalized): Secondary | ICD-10-CM

## 2019-11-20 DIAGNOSIS — G8929 Other chronic pain: Secondary | ICD-10-CM

## 2019-11-20 NOTE — Therapy (Signed)
[facility] [place], [place], [address] Phone: [phone]   Fax:  [phone]  Physical Therapy Treatment  Patient Details  Name: David Carroll MRN: [id number] Date of Birth: December 31, 1940 Referring Provider (PT): Milas Kocher, NP   Encounter Date: 11/20/2019   PT End of Session - 11/20/19 5732    Visit Number 24    Number of Visits 30    Date for PT Re-Evaluation 11/28/19    Authorization Type FOTO 171h visit 42% limitation; PROGRESS NOTE AT 10TH VISIT.  KX MODIFIER AFTER 15 VISITS.    PT Start Time 1432    PT Stop Time 1518    PT Time Calculation (min) 46 min    Activity Tolerance Patient tolerated treatment well    Behavior During Therapy WFL for tasks assessed/performed           Past Medical History:  Diagnosis Date  . Arthritis    KNEES  . BPH (benign prostatic hypertrophy)   . Complication of anesthesia SLOW RECOVERING FROM ANES. DRUGS   HX OPIATE ADDICTION-- PT ON SUBOXONE  . Depression   . Glaucoma   . History of gastric ulcer   . History of gastroesophageal reflux (GERD)   . History of kidney stones   . Hydronephrosis, left   . Hypertension   . Nephrolithiasis   . Opiate addiction ([place])   . Urgency of urination     Past Surgical History:  Procedure Laterality Date  . CATARACT EXTRACTION W/ INTRAOCULAR LENS  IMPLANT, BILATERAL    . CYSTOSCOPY WITH BIOPSY Left 11/06/2012   Procedure: CYSTOSCOPY WITH BIOPSY;  Surgeon: Alexis Frock, MD;  Location[facility];  Service: Urology;  Laterality: Left;  . EXTRACORPOREAL SHOCK WAVE LITHOTRIPSY Left 03-06-2011  . HOLMIUM LASER APPLICATION Left 07/14/5425   Procedure: HOLMIUM LASER APPLICATION;  Surgeon: Alexis Frock, MD;  Location: [facility];  Service: Urology;  Laterality: Left;  . INGUINAL HERNIA REPAIR Bilateral 1970   RE-DO LEFT 1985  . LUMBAR DISC SURGERY  09-24-2008   L3 -- L4  . LUMBAR LAMINECTOMY   05/15/2019   with partial facet joint removal  . MEDIAL COMPARTMENT ARTHROPLASTY, LEFT KNEE  02-22-2010  . REMOVAL BENIGN MASS LEFT LUNG  1977  . VAGOTOMY  1986   ULCERS    There were no vitals filed for this visit.   Subjective Assessment - 11/20/19 1434    Subjective COVID-19 screen performed prior to patient entering clinic. Reports he is having more back pain today but thinks it may be related to rainy weather.    Pertinent History Lumbar laminectomy L4-L5 05/15/2019, Left TKA, HTN    Limitations House hold activities;Walking    How long can you stand comfortably? 20 minutes.    How long can you walk comfortably? Short community distances.    Patient Stated Goals improve mobility and return to exercise routine at the [facility]    Currently in Pain? Yes    Pain Location Back    Pain Orientation Lower    Pain Descriptors / Indicators Dull;Aching    Pain Type Chronic pain    Pain Onset More than a month ago    Pain Frequency Constant              [facility] PT Assessment - 11/20/19 0001      Assessment   Medical Diagnosis Disc Degeneration, lumbar    Referring Provider (PT) Milas Kocher, NP    Onset Date/Surgical  Date 05/15/19    Next MD Visit none    Prior Therapy yes      Precautions   Precautions Fall;Back    Precaution Comments No bending, twisting, and liftinger greater than 15 lb                         [facility] Adult PT Treatment/Exercise - 11/20/19 0001      Lumbar Exercises: Aerobic   Nustep L4 x20 min per patient request      Knee/Hip Exercises: Standing   Hip Flexion AROM;Both;15 reps;Knee bent    Hip Abduction AROM;Both;10 reps;Knee straight    Other Standing Knee Exercises BLE D2 in standing alternating       Modalities   Modalities Electrical Stimulation;Moist Heat      Moist Heat Therapy   Number Minutes Moist Heat 15 Minutes    Moist Heat Location Lumbar Spine;Hip      Electrical Stimulation   Electrical Stimulation Location L low  back/ posterior hip    Electrical Stimulation Action Pre-Mod    Electrical Stimulation Parameters 80-150 hz x15 min    Electrical Stimulation Goals Pain                       PT Long Term Goals - 11/20/19 1549      PT LONG TERM GOAL #1   Title Patient will be independent with HEP    Time 6    Period Weeks    Status Achieved      PT LONG TERM GOAL #2   Title Patient will report ability to perform ADLs and lower body dressing with low back pain less than or equal to 2/10.    Time 6    Period Weeks    Status Partially Met   Reports biggest limitation being hip mobility to don/doff shoes or socks     PT LONG TERM GOAL #3   Title Patient will demonstrate proper bed mobility and log rolling technique to protect spine during transitional movements.    Time 6    Period Weeks    Status Achieved                 Plan - 11/20/19 1525    Clinical Impression Statement Patient presented in clinic and attributed more discomfort in low back to rain today. Patient able to stand more for cooking meals but still fatigues easily. Patient experiencing some difficulty with hip ER to don/doff socks or shoes. Patient guided through more standing hip strengthening exercises to assist with don/doff shoes and socks. Intermittnat posterior lean noted during standing exercises in which patient acknowledged balance deficits. Normal modalities response noted following removal of the modalities.    Personal Factors and Comorbidities Comorbidity 1    Comorbidities lumbar laminectomy L4-L5 05/15/2019; left TKA, HTN    Examination-Activity Limitations Other;Stand    Examination-Participation Restrictions Other    Stability/Clinical Decision Making Evolving/Moderate complexity    Rehab Potential Good    PT Frequency 2x / week    PT Duration 6 weeks    PT Treatment/Interventions ADLs/Self Care Home Management;Cryotherapy;Electrical Stimulation;Ultrasound;Traction;Moist Heat;Therapeutic  activities;Therapeutic exercise;Manual techniques;Patient/family education;Passive range of motion;Dry needling;Spinal Manipulations    PT Next Visit Plan Continue with lumbar/hip strengthening to assist in improving function and reducing pain.    PT Home Exercise Plan see patient education section    Consulted and Agree with Plan of Care Patient  Patient will benefit from skilled therapeutic intervention in order to improve the following deficits and impairments:  Pain, Decreased mobility, Decreased activity tolerance, Decreased range of motion, Postural dysfunction  Visit Diagnosis: Chronic bilateral low back pain with left-sided sciatica  Muscle weakness (generalized)  Chronic bilateral low back pain without sciatica     Problem List Patient Active Problem List   Diagnosis Date Noted  . Fall 09/27/2019  . Hypertension   . Benign prostatic hyperplasia   . Chronic back pain   . Fall at home, initial encounter   . Temporal bone fracture ([place])   . SDH (subdural hematoma) ([place])   . Marijuana abuse   . Alcohol abuse   . MDD (major depressive disorder) 09/18/2013  . Opiate dependence ([place]) 09/18/2013    Standley Brooking, PTA 11/20/2019, 3:50 PM  [facility] [address] [place], [place], [address] Phone: [phone]   Fax:  [phone]  Name: David Carroll MRN: [id number] Date of Birth: 1941-04-03

## 2019-11-24 ENCOUNTER — Ambulatory Visit: Payer: Medicare Other | Admitting: *Deleted

## 2019-11-24 ENCOUNTER — Other Ambulatory Visit: Payer: Self-pay

## 2019-11-24 DIAGNOSIS — M545 Low back pain, unspecified: Secondary | ICD-10-CM

## 2019-11-24 DIAGNOSIS — M6281 Muscle weakness (generalized): Secondary | ICD-10-CM

## 2019-11-24 DIAGNOSIS — M5442 Lumbago with sciatica, left side: Secondary | ICD-10-CM | POA: Diagnosis not present

## 2019-11-24 DIAGNOSIS — G8929 Other chronic pain: Secondary | ICD-10-CM

## 2019-11-24 NOTE — Therapy (Signed)
[facility] [place], [place], [address] Phone: [phone]   Fax:  [phone]  Physical Therapy Treatment  Patient Details  Name: David Carroll MRN: [id number] Date of Birth: Aug 07, 1940 Referring Provider (PT): Milas Kocher, NP   Encounter Date: 11/24/2019   PT End of Session - 11/24/19 1319    Visit Number 25    Number of Visits 30    Date for PT Re-Evaluation 11/28/19    PT Start Time 1310    PT Stop Time 1356    PT Time Calculation (min) 46 min           Past Medical History:  Diagnosis Date   Arthritis    KNEES   BPH (benign prostatic hypertrophy)    Complication of anesthesia SLOW RECOVERING FROM ANES. DRUGS   HX OPIATE ADDICTION-- PT ON SUBOXONE   Depression    Glaucoma    History of gastric ulcer    History of gastroesophageal reflux (GERD)    History of kidney stones    Hydronephrosis, left    Hypertension    Nephrolithiasis    Opiate addiction ([place])    Urgency of urination     Past Surgical History:  Procedure Laterality Date   CATARACT EXTRACTION W/ INTRAOCULAR LENS  IMPLANT, BILATERAL     CYSTOSCOPY WITH BIOPSY Left 11/06/2012   Procedure: CYSTOSCOPY WITH BIOPSY;  Surgeon: Alexis Frock, MD;  Location: [facility];  Service: Urology;  Laterality: Left;   EXTRACORPOREAL SHOCK WAVE LITHOTRIPSY Left 03-06-2011   HOLMIUM LASER APPLICATION Left 06/20/3233   Procedure: HOLMIUM LASER APPLICATION;  Surgeon: Alexis Frock, MD;  Location: [facility];  Service: Urology;  Laterality: Left;   INGUINAL HERNIA REPAIR Bilateral 1970   RE-DO [place] SURGERY  09-24-2008   L3 -- L4   LUMBAR LAMINECTOMY  05/15/2019   with partial facet joint removal   MEDIAL COMPARTMENT ARTHROPLASTY, LEFT KNEE  02-22-2010   REMOVAL BENIGN MASS LEFT LUNG  1977   VAGOTOMY  1986   ULCERS    There were no vitals filed for this visit.   Subjective  Assessment - 11/24/19 1318    Subjective COVID-19 screen performed prior to patient entering clinic. Hips are sore from sitting in a chair at a concert    Pertinent History Lumbar laminectomy L4-L5 05/15/2019, Left TKA, HTN    Limitations House hold activities;Walking    How long can you stand comfortably? 20 minutes.    How long can you walk comfortably? Short community distances.    Patient Stated Goals improve mobility and return to exercise routine at the [facility]    Currently in Pain? Yes    Pain Score 3     Pain Location Back    Pain Orientation Lower    Pain Descriptors / Indicators Aching;Dull    Pain Onset More than a month ago                             [facility] Adult PT Treatment/Exercise - 11/24/19 0001      Exercises   Exercises Knee/Hip      Lumbar Exercises: Aerobic   Nustep Level 4 x 15 minutes with a cadence maintained at 80 SPM+.      Lumbar Exercises: Standing   Shoulder Extension Strengthening   XTS blue 3x10   Other Standing Lumbar Exercises 4# ball reachouts with lunge x5 each  side      Knee/Hip Exercises: Standing   Hip Abduction AROM;Both;10 reps;Knee straight;2 sets      Modalities   Modalities Electrical Stimulation;Moist Heat      Moist Heat Therapy   Number Minutes Moist Heat 15 Minutes    Moist Heat Location Lumbar Spine;Hip      Electrical Stimulation   Electrical Stimulation Location LB    Electrical Stimulation Action IFC     Electrical Stimulation Parameters 80-150hz x 15 mins                       PT Long Term Goals - 11/20/19 1549      PT LONG TERM GOAL #1   Title Patient will be independent with HEP    Time 6    Period Weeks    Status Achieved      PT LONG TERM GOAL #2   Title Patient will report ability to perform ADLs and lower body dressing with low back pain less than or equal to 2/10.    Time 6    Period Weeks    Status Partially Met   Reports biggest limitation being hip mobility to don/doff  shoes or socks     PT LONG TERM GOAL #3   Title Patient will demonstrate proper bed mobility and log rolling technique to protect spine during transitional movements.    Time 6    Period Weeks    Status Achieved                 Plan - 11/24/19 1608    Clinical Impression Statement Pt arrived today doing fair, but reports being sore from sitting a lot for a concert and the chair mmade his hips sore. Rx focused on functional movements in standing and from sitting. He did well with only c/o LB tightness end of session. Normal modality response    Comorbidities lumbar laminectomy L4-L5 05/15/2019; left TKA, HTN    Stability/Clinical Decision Making Evolving/Moderate complexity    Rehab Potential Good    PT Frequency 2x / week    PT Duration 6 weeks    PT Treatment/Interventions ADLs/Self Care Home Management;Cryotherapy;Electrical Stimulation;Ultrasound;Traction;Moist Heat;Therapeutic activities;Therapeutic exercise;Manual techniques;Patient/family education;Passive range of motion;Dry needling;Spinal Manipulations    PT Next Visit Plan Continue with lumbar/hip strengthening to assist in improving function and reducing pain.           Patient will benefit from skilled therapeutic intervention in order to improve the following deficits and impairments:     Visit Diagnosis: Muscle weakness (generalized)  Chronic bilateral low back pain with left-sided sciatica  Chronic bilateral low back pain without sciatica     Problem List Patient Active Problem List   Diagnosis Date Noted   Fall 09/27/2019   Hypertension    Benign prostatic hyperplasia    Chronic back pain    Fall at home, initial encounter    Temporal bone fracture ([facility])    SDH (subdural hematoma) ([place])    Marijuana abuse    Alcohol abuse    MDD (major depressive disorder) 09/18/2013   Opiate dependence ([place]) 09/18/2013    Tharun Cappella,CHRIS, PTA 11/24/2019, 4:23 PM  [facility] [address] [place], [place], [address] Phone: [phone]   Fax:  [phone]  Name: David Carroll MRN: [id number] Date of Birth: Sep 14, 1940

## 2019-11-27 ENCOUNTER — Other Ambulatory Visit: Payer: Self-pay

## 2019-11-27 ENCOUNTER — Ambulatory Visit: Payer: Medicare Other | Admitting: Physical Therapy

## 2019-11-27 ENCOUNTER — Encounter: Payer: Self-pay | Admitting: Physical Therapy

## 2019-11-27 DIAGNOSIS — G8929 Other chronic pain: Secondary | ICD-10-CM

## 2019-11-27 DIAGNOSIS — M5442 Lumbago with sciatica, left side: Secondary | ICD-10-CM

## 2019-11-27 DIAGNOSIS — M6281 Muscle weakness (generalized): Secondary | ICD-10-CM

## 2019-11-27 NOTE — Therapy (Signed)
[facility] [place], [place], [address] Phone: [phone]   Fax:  [phone]  Physical Therapy Treatment  Patient Details  Name: David Carroll MRN: [id number] Date of Birth: 1941/05/31 Referring Provider (PT): Milas Kocher, NP   Encounter Date: 11/27/2019   PT End of Session - 11/27/19 1401    Visit Number 26    Number of Visits 30    Date for PT Re-Evaluation 11/28/19    Authorization Type FOTO 171h visit 42% limitation; PROGRESS NOTE AT 10TH VISIT.  KX MODIFIER AFTER 15 VISITS.    PT Start Time 0152    PT Stop Time 0235    PT Time Calculation (min) 43 min    Activity Tolerance Patient tolerated treatment well    Behavior During Therapy [facility] for tasks assessed/performed           Past Medical History:  Diagnosis Date  . Arthritis    KNEES  . BPH (benign prostatic hypertrophy)   . Complication of anesthesia SLOW RECOVERING FROM ANES. DRUGS   HX OPIATE ADDICTION-- PT ON SUBOXONE  . Depression   . Glaucoma   . History of gastric ulcer   . History of gastroesophageal reflux (GERD)   . History of kidney stones   . Hydronephrosis, left   . Hypertension   . Nephrolithiasis   . Opiate addiction ([place])   . Urgency of urination     Past Surgical History:  Procedure Laterality Date  . CATARACT EXTRACTION W/ INTRAOCULAR LENS  IMPLANT, BILATERAL    . CYSTOSCOPY WITH BIOPSY Left 11/06/2012   Procedure: CYSTOSCOPY WITH BIOPSY;  Surgeon: Alexis Frock, MD;  Location: [facility];  Service: Urology;  Laterality: Left;  . EXTRACORPOREAL SHOCK WAVE LITHOTRIPSY Left 03-06-2011  . HOLMIUM LASER APPLICATION Left 3/75/4360   Procedure: HOLMIUM LASER APPLICATION;  Surgeon: Alexis Frock, MD;  Location: [facility];  Service: Urology;  Laterality: Left;  . INGUINAL HERNIA REPAIR Bilateral 1970   RE-DO LEFT 1985  . LUMBAR DISC SURGERY  09-24-2008   L3 -- L4  . LUMBAR LAMINECTOMY   05/15/2019   with partial facet joint removal  . MEDIAL COMPARTMENT ARTHROPLASTY, LEFT KNEE  02-22-2010  . REMOVAL BENIGN MASS LEFT LUNG  1977  . VAGOTOMY  1986   ULCERS    There were no vitals filed for this visit.   Subjective Assessment - 11/27/19 1356    Subjective COVID-19 screen performed prior to patient entering clinic. Patient arrived with ongoing discomfort    Pertinent History Lumbar laminectomy L4-L5 05/15/2019, Left TKA, HTN    Limitations House hold activities;Walking    How long can you stand comfortably? 20 minutes.    How long can you walk comfortably? Short community distances.    Patient Stated Goals improve mobility and return to exercise routine at the [facility]    Currently in Pain? Yes    Pain Score 3     Pain Location Back    Pain Orientation Lower    Pain Descriptors / Indicators Discomfort;Aching    Pain Type Chronic pain    Pain Onset More than a month ago    Pain Frequency Constant    Aggravating Factors  prolong movement    Pain Relieving Factors rest                             [facility] Adult PT Treatment/Exercise - 11/27/19 0001  Lumbar Exercises: Aerobic   Nustep Level 4 x 10 minutes with a cadence maintained at 80 SPM+.   limited due to knee pain     Lumbar Exercises: Standing   Other Standing Lumbar Exercises marching in place with core activation 2x20, 8"step for toe taps with core activation 2x20, balance on airex x 62mn with core activation      Lumbar Exercises: Seated   Other Seated Lumbar Exercises sitting on dyna disc with core activation with 4# reachouts 2x10, diagnols 2x10, green band for rows and ext 2x10 each      Lumbar Exercises: Supine   Bridge with clamshell 20 reps   w green tband   Other Supine Lumbar Exercises clamshell w green t-band 2x10      Moist Heat Therapy   Number Minutes Moist Heat 15 Minutes    Moist Heat Location Lumbar Spine      Electrical Stimulation   Electrical Stimulation Location low  back    Electrical Stimulation Action IFC    Electrical Stimulation Parameters 80-150hz  x162m    Electrical Stimulation Goals Pain                       PT Long Term Goals - 11/27/19 1358      PT LONG TERM GOAL #1   Title Patient will be independent with HEP    Time 6    Period Weeks    Status Achieved      PT LONG TERM GOAL #2   Title Patient will report ability to perform ADLs and lower body dressing with low back pain less than or equal to 2/10.    Time 6    Period Weeks    Status Partially Met      PT LONG TERM GOAL #3   Title Patient will demonstrate proper bed mobility and log rolling technique to protect spine during transitional movements.    Time 6    Period Weeks    Status Achieved                 Plan - 11/27/19 1402    Clinical Impression Statement Patient tolerated treatment well today. Patient able to progress with core exercises overall and decreased pain. Patient continues to have some difficulty with standing activities. Today focused on core progression with balance in sitting and standing to improve functional independence. Patient reported he walks daily with a walking cane. Goals progresisng today.    Personal Factors and Comorbidities Comorbidity 1    Comorbidities lumbar laminectomy L4-L5 05/15/2019; left TKA, HTN    Examination-Activity Limitations Other;Stand    Examination-Participation Restrictions Other    Stability/Clinical Decision Making Evolving/Moderate complexity    Rehab Potential Good    PT Frequency 2x / week    PT Duration 6 weeks    PT Treatment/Interventions ADLs/Self Care Home Management;Cryotherapy;Electrical Stimulation;Ultrasound;Traction;Moist Heat;Therapeutic activities;Therapeutic exercise;Manual techniques;Patient/family education;Passive range of motion;Dry needling;Spinal Manipulations    PT Next Visit Plan Continue with lumbar/hip strengthening to assist in improving function and reducing pain. FOTO next  treatment    Consulted and Agree with Plan of Care Patient           Patient will benefit from skilled therapeutic intervention in order to improve the following deficits and impairments:  Pain, Decreased mobility, Decreased activity tolerance, Decreased range of motion, Postural dysfunction  Visit Diagnosis: Muscle weakness (generalized)  Chronic bilateral low back pain with left-sided sciatica  Chronic bilateral low back pain without sciatica  Problem List Patient Active Problem List   Diagnosis Date Noted  . Fall 09/27/2019  . Hypertension   . Benign prostatic hyperplasia   . Chronic back pain   . Fall at home, initial encounter   . Temporal bone fracture ([place])   . SDH (subdural hematoma) ([place])   . Marijuana abuse   . Alcohol abuse   . MDD (major depressive disorder) 09/18/2013  . Opiate dependence ([place]) 09/18/2013    Lynnex Fulp P, PTA 11/27/2019, 2:36 PM  [facility] [place], [place], [address] Phone: [phone]   Fax:  [phone]  Name: David Carroll MRN: [id number] Date of Birth: [birth year]

## 2019-12-01 ENCOUNTER — Encounter: Payer: Self-pay | Admitting: Physical Therapy

## 2019-12-01 ENCOUNTER — Other Ambulatory Visit: Payer: Self-pay

## 2019-12-01 ENCOUNTER — Ambulatory Visit: Payer: Medicare Other | Admitting: Physical Therapy

## 2019-12-01 DIAGNOSIS — M5442 Lumbago with sciatica, left side: Secondary | ICD-10-CM

## 2019-12-01 DIAGNOSIS — G8929 Other chronic pain: Secondary | ICD-10-CM

## 2019-12-01 DIAGNOSIS — M6281 Muscle weakness (generalized): Secondary | ICD-10-CM

## 2019-12-01 NOTE — Therapy (Signed)
[facility] [place], [place], [address] Phone: [phone]   Fax:  [phone]  Physical Therapy Treatment  Patient Details  Name: Marcia Lepera MRN: [id number] Date of Birth: 1941-05-11 Referring Provider (PT): Milas Kocher, NP   Encounter Date: 12/01/2019   PT End of Session - 12/01/19 1312    Visit Number 27    Number of Visits 30    Date for PT Re-Evaluation 11/28/19    Authorization Type FOTO 171h visit 42% limitation; PROGRESS NOTE AT 10TH VISIT.  KX MODIFIER AFTER 15 VISITS.    PT Start Time 1303    PT Stop Time 1346    PT Time Calculation (min) 43 min    Activity Tolerance Patient tolerated treatment well    Behavior During Therapy WFL for tasks assessed/performed           Past Medical History:  Diagnosis Date  . Arthritis    KNEES  . BPH (benign prostatic hypertrophy)   . Complication of anesthesia SLOW RECOVERING FROM ANES. DRUGS   HX OPIATE ADDICTION-- PT ON SUBOXONE  . Depression   . Glaucoma   . History of gastric ulcer   . History of gastroesophageal reflux (GERD)   . History of kidney stones   . Hydronephrosis, left   . Hypertension   . Nephrolithiasis   . Opiate addiction ([place])   . Urgency of urination     Past Surgical History:  Procedure Laterality Date  . CATARACT EXTRACTION W/ INTRAOCULAR LENS  IMPLANT, BILATERAL    . CYSTOSCOPY WITH BIOPSY Left 11/06/2012   Procedure: CYSTOSCOPY WITH BIOPSY;  Surgeon: Alexis Frock, MD;  Location: [facility];  Service: Urology;  Laterality: Left;  . EXTRACORPOREAL SHOCK WAVE LITHOTRIPSY Left 03-06-2011  . HOLMIUM LASER APPLICATION Left 7/89/3810   Procedure: HOLMIUM LASER APPLICATION;  Surgeon: Alexis Frock, MD;  Location: [facility];  Service: Urology;  Laterality: Left;  . INGUINAL HERNIA REPAIR Bilateral 1970   RE-DO LEFT 1985  . LUMBAR DISC SURGERY  09-24-2008   L3 -- L4  . LUMBAR LAMINECTOMY   05/15/2019   with partial facet joint removal  . MEDIAL COMPARTMENT ARTHROPLASTY, LEFT KNEE  02-22-2010  . REMOVAL BENIGN MASS LEFT LUNG  1977  . VAGOTOMY  1986   ULCERS    There were no vitals filed for this visit.   Subjective Assessment - 12/01/19 1312    Subjective COVID-19 screen performed prior to patient entering clinic. Patient reports waking with stomach issues but states that his back is better. Reports chronic knee discomfort today as well.    Pertinent History Lumbar laminectomy L4-L5 05/15/2019, Left TKA, HTN    Limitations House hold activities;Walking    How long can you stand comfortably? 20 minutes.    How long can you walk comfortably? Short community distances.    Patient Stated Goals improve mobility and return to exercise routine at the [facility]    Currently in Pain? No/denies              [facility] PT Assessment - 12/01/19 0001      Assessment   Medical Diagnosis Disc Degeneration, lumbar    Referring Provider (PT) Milas Kocher, NP    Onset Date/Surgical Date 05/15/19    Next MD Visit none    Prior Therapy yes      Precautions   Precautions Fall;Back    Precaution Comments No bending, twisting, and liftinger greater than 15 lb  [facility] Adult PT Treatment/Exercise - 12/01/19 0001      Lumbar Exercises: Aerobic   Nustep L4 x20 min    per patient request     Lumbar Exercises: Standing   Shoulder Extension Strengthening;Both;15 reps;Limitations    Shoulder Extension Limitations green XTS with core     Other Standing Lumbar Exercises B chop/lift green XTS x15 reps each with core    Other Standing Lumbar Exercises .      Knee/Hip Exercises: Standing   Hip Flexion AROM;Both;15 reps;Knee bent   toe taps to 8" step with core activaiton   Forward Step Up Both;15 reps;Hand Hold: 2;Step Height: 6"   with core activation     Knee/Hip Exercises: Supine   Bridges with Clamshell Strengthening;Both;20 reps;Limitations   red  therabamd   Straight Leg Raises AROM;Both;20 reps    Straight Leg Raises Limitations with core activation      Modalities   Modalities Electrical Stimulation;Moist Heat      Moist Heat Therapy   Number Minutes Moist Heat 10 Minutes    Moist Heat Location Lumbar Spine      Electrical Stimulation   Electrical Stimulation Location B lumbosacral region    Electrical Stimulation Action Pre-Mod    Electrical Stimulation Parameters 80-150 hz x10 min    Electrical Stimulation Goals Pain                       PT Long Term Goals - 11/27/19 1358      PT LONG TERM GOAL #1   Title Patient will be independent with HEP    Time 6    Period Weeks    Status Achieved      PT LONG TERM GOAL #2   Title Patient will report ability to perform ADLs and lower body dressing with low back pain less than or equal to 2/10.    Time 6    Period Weeks    Status Partially Met      PT LONG TERM GOAL #3   Title Patient will demonstrate proper bed mobility and log rolling technique to protect spine during transitional movements.    Time 6    Period Weeks    Status Achieved                 Plan - 12/01/19 1340    Clinical Impression Statement Patient presented in clinic with no current LBP. Patient had two cases of posterior LOB which was self corrected during standing XTS exercises. Patient indicates that he does primarily have posterior LOB since surgery. Patient able to complete more therex with core activation but as standing exercises completed he did report minimal LBP. Patient reported low back discomfort with SLR. Normal modaltiies response noted following removal of the modalities.    Personal Factors and Comorbidities Comorbidity 1    Comorbidities lumbar laminectomy L4-L5 05/15/2019; left TKA, HTN    Examination-Activity Limitations Other;Stand    Examination-Participation Restrictions Other    Stability/Clinical Decision Making Evolving/Moderate complexity    Rehab Potential  Good    PT Frequency 2x / week    PT Duration 6 weeks    PT Treatment/Interventions ADLs/Self Care Home Management;Cryotherapy;Electrical Stimulation;Ultrasound;Traction;Moist Heat;Therapeutic activities;Therapeutic exercise;Manual techniques;Patient/family education;Passive range of motion;Dry needling;Spinal Manipulations    PT Next Visit Plan Continue with lumbar/hip strengthening to assist in improving function and reducing pain. FOTO next treatment    PT Home Exercise Plan see patient education section    Consulted and Agree  with Plan of Care Patient           Patient will benefit from skilled therapeutic intervention in order to improve the following deficits and impairments:  Pain, Decreased mobility, Decreased activity tolerance, Decreased range of motion, Postural dysfunction  Visit Diagnosis: Muscle weakness (generalized)  Chronic bilateral low back pain with left-sided sciatica  Chronic bilateral low back pain without sciatica     Problem List Patient Active Problem List   Diagnosis Date Noted  . Fall 09/27/2019  . Hypertension   . Benign prostatic hyperplasia   . Chronic back pain   . Fall at home, initial encounter   . Temporal bone fracture ([place])   . SDH (subdural hematoma) ([place])   . Marijuana abuse   . Alcohol abuse   . MDD (major depressive disorder) 09/18/2013  . Opiate dependence ([place]) 09/18/2013    Standley Brooking, PTA 12/01/2019, 1:51 PM  [facility] [address] [place], [place], [address] Phone: [phone]   Fax:  [phone]  Name: Jahziah Simonin MRN: [id number] Date of Birth: 02-26-1941

## 2019-12-02 NOTE — Addendum Note (Signed)
Addended by: Alexandru Moorer, [place] W on: 12/02/2019 12:57 PM   Modules accepted: Orders

## 2019-12-08 ENCOUNTER — Other Ambulatory Visit: Payer: Self-pay

## 2019-12-08 ENCOUNTER — Encounter: Payer: Self-pay | Admitting: Physical Therapy

## 2019-12-08 ENCOUNTER — Ambulatory Visit: Payer: Medicare Other | Admitting: Physical Therapy

## 2019-12-08 DIAGNOSIS — M5442 Lumbago with sciatica, left side: Secondary | ICD-10-CM

## 2019-12-08 DIAGNOSIS — G8929 Other chronic pain: Secondary | ICD-10-CM

## 2019-12-08 DIAGNOSIS — M6281 Muscle weakness (generalized): Secondary | ICD-10-CM

## 2019-12-08 NOTE — Therapy (Signed)
[facility] [place], [place], [address] Phone: [phone]   Fax:  [phone]  Physical Therapy Treatment  Patient Details  Name: David Carroll MRN: [id number] Date of Birth: 07-06-1940 Referring Provider (PT): Milas Kocher, NP   Encounter Date: 12/08/2019   PT End of Session - 12/08/19 1319    Visit Number 28    Number of Visits 30    Date for PT Re-Evaluation 12/26/19    Authorization Type FOTO 171h visit 42% limitation; PROGRESS NOTE AT 10TH VISIT.  KX MODIFIER AFTER 15 VISITS.    PT Start Time 1307    PT Stop Time 1352    PT Time Calculation (min) 45 min    Activity Tolerance Patient tolerated treatment well    Behavior During Therapy WFL for tasks assessed/performed           Past Medical History:  Diagnosis Date  . Arthritis    KNEES  . BPH (benign prostatic hypertrophy)   . Complication of anesthesia SLOW RECOVERING FROM ANES. DRUGS   HX OPIATE ADDICTION-- PT ON SUBOXONE  . Depression   . Glaucoma   . History of gastric ulcer   . History of gastroesophageal reflux (GERD)   . History of kidney stones   . Hydronephrosis, left   . Hypertension   . Nephrolithiasis   . Opiate addiction ([place])   . Urgency of urination     Past Surgical History:  Procedure Laterality Date  . CATARACT EXTRACTION W/ INTRAOCULAR LENS  IMPLANT, BILATERAL    . CYSTOSCOPY WITH BIOPSY Left 11/06/2012   Procedure: CYSTOSCOPY WITH BIOPSY;  Surgeon: Alexis Frock, MD;  Location[facility];  Service: Urology;  Laterality: Left;  . EXTRACORPOREAL SHOCK WAVE LITHOTRIPSY Left 03-06-2011  . HOLMIUM LASER APPLICATION Left 0/63/0160   Procedure: HOLMIUM LASER APPLICATION;  Surgeon: Alexis Frock, MD;  Location: [facility];  Service: Urology;  Laterality: Left;  . INGUINAL HERNIA REPAIR Bilateral 1970   RE-DO LEFT 1985  . LUMBAR DISC SURGERY  09-24-2008   L3 -- L4  . LUMBAR LAMINECTOMY   05/15/2019   with partial facet joint removal  . MEDIAL COMPARTMENT ARTHROPLASTY, LEFT KNEE  02-22-2010  . REMOVAL BENIGN MASS LEFT LUNG  1977  . VAGOTOMY  1986   ULCERS    There were no vitals filed for this visit.   Subjective Assessment - 12/08/19 1317    Subjective COVID 19 screening performed on patient upon arrival. Patient reports low level pain but not bad. States that he would love to be gardening but understands he is unable.    Pertinent History Lumbar laminectomy L4-L5 05/15/2019, Left TKA, HTN    Limitations House hold activities;Walking    How long can you stand comfortably? 20 minutes.    How long can you walk comfortably? Short community distances.    Patient Stated Goals improve mobility and return to exercise routine at the [facility]    Currently in Pain? Yes    Pain Score 2     Pain Location Back    Pain Orientation Lower    Pain Descriptors / Indicators Discomfort    Pain Type Chronic pain    Pain Onset More than a month ago    Pain Frequency Constant              [facility] PT Assessment - 12/08/19 0001      Assessment   Medical Diagnosis Disc Degeneration, lumbar    Referring  Provider (PT) Milas Kocher, NP    Onset Date/Surgical Date 05/15/19    Next MD Visit none    Prior Therapy yes      Precautions   Precautions Fall;Back    Precaution Comments No bending, twisting, and liftinger greater than 15 lb                         [facility] Adult PT Treatment/Exercise - 12/08/19 0001      Lumbar Exercises: Aerobic   Nustep L4 x15 min      Lumbar Exercises: Standing   Row Strengthening;Both;15 reps;Limitations    Row Limitations Blue XTS with core     Shoulder Extension Strengthening;Both;15 reps;Limitations    Shoulder Extension Limitations Blue XTS with core    Other Standing Lumbar Exercises B chop/lift BLE XTS x15 reps each with core      Lumbar Exercises: Seated   Sit to Stand 10 reps   limited by R knee pain     Lumbar Exercises:  Supine   Clam 20 reps    Clam Limitations green theraband      Modalities   Modalities Electrical Stimulation;Moist Heat      Moist Heat Therapy   Number Minutes Moist Heat 10 Minutes    Moist Heat Location Lumbar Spine      Electrical Stimulation   Electrical Stimulation Location B lumbosacral region    Electrical Stimulation Action Pre-Mod    Electrical Stimulation Parameters 80-150 hz x10 min    Electrical Stimulation Goals Pain                       PT Long Term Goals - 11/27/19 1358      PT LONG TERM GOAL #1   Title Patient will be independent with HEP    Time 6    Period Weeks    Status Achieved      PT LONG TERM GOAL #2   Title Patient will report ability to perform ADLs and lower body dressing with low back pain less than or equal to 2/10.    Time 6    Period Weeks    Status Partially Met      PT LONG TERM GOAL #3   Title Patient will demonstrate proper bed mobility and log rolling technique to protect spine during transitional movements.    Time 6    Period Weeks    Status Achieved                 Plan - 12/08/19 1344    Clinical Impression Statement Patient presented in clinic with minimal LBP. Patient guided through more functional activities with core activation. Patient limited by R knee pain during therex. Normal modalities response noted following removal of the modalities.    Personal Factors and Comorbidities Comorbidity 1    Comorbidities lumbar laminectomy L4-L5 05/15/2019; left TKA, HTN    Examination-Activity Limitations Other;Stand    Examination-Participation Restrictions Other    Stability/Clinical Decision Making Evolving/Moderate complexity    Rehab Potential Good    PT Frequency 2x / week    PT Duration 6 weeks    PT Treatment/Interventions ADLs/Self Care Home Management;Cryotherapy;Electrical Stimulation;Ultrasound;Traction;Moist Heat;Therapeutic activities;Therapeutic exercise;Manual techniques;Patient/family  education;Passive range of motion;Dry needling;Spinal Manipulations    PT Next Visit Plan Continue with lumbar/hip strengthening to assist in improving function and reducing pain. FOTO next treatment    PT Home Exercise Plan see patient education section  Consulted and Agree with Plan of Care Patient           Patient will benefit from skilled therapeutic intervention in order to improve the following deficits and impairments:  Pain, Decreased mobility, Decreased activity tolerance, Decreased range of motion, Postural dysfunction  Visit Diagnosis: Muscle weakness (generalized)  Chronic bilateral low back pain with left-sided sciatica  Chronic bilateral low back pain without sciatica     Problem List Patient Active Problem List   Diagnosis Date Noted  . Fall 09/27/2019  . Hypertension   . Benign prostatic hyperplasia   . Chronic back pain   . Fall at home, initial encounter   . Temporal bone fracture ([place])   . SDH (subdural hematoma) ([place])   . Marijuana abuse   . Alcohol abuse   . MDD (major depressive disorder) 09/18/2013  . Opiate dependence ([place]) 09/18/2013    Standley Brooking, PTA 12/08/2019, 2:17 PM  [facility] [address] [place], [place], [address] Phone: [phone]   Fax:  [phone]  Name: David Carroll MRN: [id number] Date of Birth: [birth year]

## 2019-12-23 ENCOUNTER — Other Ambulatory Visit: Payer: Self-pay

## 2019-12-23 ENCOUNTER — Ambulatory Visit: Payer: Medicare Other | Attending: Nurse Practitioner | Admitting: Physical Therapy

## 2019-12-23 DIAGNOSIS — G8929 Other chronic pain: Secondary | ICD-10-CM | POA: Insufficient documentation

## 2019-12-23 DIAGNOSIS — M545 Low back pain: Secondary | ICD-10-CM | POA: Insufficient documentation

## 2019-12-23 DIAGNOSIS — M6281 Muscle weakness (generalized): Secondary | ICD-10-CM | POA: Diagnosis present

## 2019-12-23 DIAGNOSIS — M5442 Lumbago with sciatica, left side: Secondary | ICD-10-CM | POA: Insufficient documentation

## 2019-12-23 NOTE — Therapy (Signed)
[facility] [place], [place], [address] Phone: [phone]   Fax:  [phone]  Physical Therapy Treatment Discharge  Patient Details  Name: Square Jowett MRN: [id number] Date of Birth: 12-01-1940 Referring Provider (PT): Milas Kocher, NP   Encounter Date: 12/23/2019   PT End of Session - 12/23/19 1127    Visit Number 29    Number of Visits 30    Date for PT Re-Evaluation 12/26/19    Authorization Type FOTO 171h visit 42% limitation; PROGRESS NOTE AT 10TH VISIT.  KX MODIFIER AFTER 15 VISITS.    PT Start Time 1115    PT Stop Time 1200    PT Time Calculation (min) 45 min    Activity Tolerance Patient tolerated treatment well    Behavior During Therapy WFL for tasks assessed/performed           Past Medical History:  Diagnosis Date  . Arthritis    KNEES  . BPH (benign prostatic hypertrophy)   . Complication of anesthesia SLOW RECOVERING FROM ANES. DRUGS   HX OPIATE ADDICTION-- PT ON SUBOXONE  . Depression   . Glaucoma   . History of gastric ulcer   . History of gastroesophageal reflux (GERD)   . History of kidney stones   . Hydronephrosis, left   . Hypertension   . Nephrolithiasis   . Opiate addiction ([place])   . Urgency of urination     Past Surgical History:  Procedure Laterality Date  . CATARACT EXTRACTION W/ INTRAOCULAR LENS  IMPLANT, BILATERAL    . CYSTOSCOPY WITH BIOPSY Left 11/06/2012   Procedure: CYSTOSCOPY WITH BIOPSY;  Surgeon: Alexis Frock, MD;  Location: [facility];  Service: Urology;  Laterality: Left;  . EXTRACORPOREAL SHOCK WAVE LITHOTRIPSY Left 03-06-2011  . HOLMIUM LASER APPLICATION Left 6/70/1410   Procedure: HOLMIUM LASER APPLICATION;  Surgeon: Alexis Frock, MD;  Location: [facility];  Service: Urology;  Laterality: Left;  . INGUINAL HERNIA REPAIR Bilateral 1970   RE-DO LEFT 1985  . LUMBAR DISC SURGERY  09-24-2008   L3 -- L4  . LUMBAR  LAMINECTOMY  05/15/2019   with partial facet joint removal  . MEDIAL COMPARTMENT ARTHROPLASTY, LEFT KNEE  02-22-2010  . REMOVAL BENIGN MASS LEFT LUNG  1977  . VAGOTOMY  1986   ULCERS    There were no vitals filed for this visit.   Subjective Assessment - 12/23/19 1202    Subjective COVID 19 screening performed on patient upon arrival. Patient reports mild pain in R hip/low back today.    Pertinent History Lumbar laminectomy L4-L5 05/15/2019, Left TKA, HTN    Limitations House hold activities;Walking    How long can you stand comfortably? 20 minutes.    How long can you walk comfortably? Short community distances.    Patient Stated Goals improve mobility and return to exercise routine at the [facility]    Currently in Pain? Yes    Pain Score 4     Pain Location Hip    Pain Orientation Right    Pain Descriptors / Indicators Sore    Pain Type Chronic pain    Pain Onset More than a month ago    Pain Frequency Intermittent              [facility] PT Assessment - 12/23/19 0001      Assessment   Medical Diagnosis Disc Degeneration, lumbar    Referring Provider (PT) Milas Kocher, NP    Onset Date/Surgical  Date 05/15/19    Next MD Visit none    Prior Therapy yes                         [facility] Adult PT Treatment/Exercise - 12/23/19 0001      Lumbar Exercises: Aerobic   Nustep L4 x15 min      Lumbar Exercises: Standing   Row Strengthening;Both;15 reps;Limitations    Row Limitations blue XTS with cues for core activation    Shoulder Extension Strengthening;Both;15 reps;Limitations    Shoulder Extension Limitations blue XTS with cues for core activation    Other Standing Lumbar Exercises B chop/lift BLE XTS x15 reps each with core      Lumbar Exercises: Seated   Sit to Stand 10 reps   limited by R knee pain     Knee/Hip Exercises: Standing   Other Standing Knee Exercises mini squats with instructions for back posture      Modalities   Modalities Electrical  Stimulation;Moist Heat      Moist Heat Therapy   Number Minutes Moist Heat 10 Minutes    Moist Heat Location Lumbar Spine      Electrical Stimulation   Electrical Stimulation Location B lumbosacral region    Electrical Stimulation Action Pre-mod    Electrical Stimulation Parameters 80-150 Hz x 10 minutes    Electrical Stimulation Goals Pain                       PT Long Term Goals - 12/23/19 1142      PT LONG TERM GOAL #1   Title Patient will be independent with HEP    Time 6    Period Weeks    Status Achieved      PT LONG TERM GOAL #3   Title Patient will demonstrate proper bed mobility and log rolling technique to protect spine during transitional movements.    Baseline Pt reports he has been sleeping on his back so he doesn't have to turn much. Pt reporting he is able to turn in bed.    Status Achieved                 Plan - 12/23/19 1159    Clinical Impression Statement Pt arriving to [facility]pay reporting pain in his right hip today. Pt reporting he hasn't been doing his exercises as consistent lately, but feels comfortable when performing at home. Pt has currently met his LTG's set at his initial evaluation. We discussed discharging pt today with instructions to continue his HEP and working on posture and core activation and hip strengthening/stretching. Pt in agreement with discharge.    Personal Factors and Comorbidities Comorbidity 1    Comorbidities lumbar laminectomy L4-L5 05/15/2019; left TKA, HTN    Examination-Activity Limitations Other;Stand    Examination-Participation Restrictions Other    Stability/Clinical Decision Making Evolving/Moderate complexity    Rehab Potential Good    PT Frequency 2x / week    PT Duration 6 weeks    PT Treatment/Interventions ADLs/Self Care Home Management;Cryotherapy;Electrical Stimulation;Ultrasound;Traction;Moist Heat;Therapeutic activities;Therapeutic exercise;Manual techniques;Patient/family education;Passive  range of motion;Dry needling;Spinal Manipulations    PT Next Visit Plan Continue with lumbar/hip strengthening to assist in improving function and reducing pain. FOTO next treatment    PT Home Exercise Plan see patient education section    Consulted and Agree with Plan of Care Patient           Patient will benefit from skilled therapeutic  intervention in order to improve the following deficits and impairments:  Pain, Decreased mobility, Decreased activity tolerance, Decreased range of motion, Postural dysfunction  Visit Diagnosis: Muscle weakness (generalized)  Chronic bilateral low back pain with left-sided sciatica  Chronic bilateral low back pain without sciatica   PHYSICAL THERAPY DISCHARGE SUMMARY  Visits from Start of Care: 29  Current functional level related to goals / functional outcomes: See above   Remaining deficits: See above   Education / Equipment: HEP Plan: Patient agrees to discharge.  Patient goals were met. Patient is being discharged due to meeting the stated rehab goals.  ?????       Problem List Patient Active Problem List   Diagnosis Date Noted  . Fall 09/27/2019  . Hypertension   . Benign prostatic hyperplasia   . Chronic back pain   . Fall at home, initial encounter   . Temporal bone fracture ([place])   . SDH (subdural hematoma) ([place])   . Marijuana abuse   . Alcohol abuse   . MDD (major depressive disorder) 09/18/2013  . Opiate dependence ([place]) 09/18/2013    Oretha Caprice, PT, MPT 12/23/2019, 12:03 PM  [facility] [place], [place], [address] Phone: [phone]   Fax:  [phone]  Name: Bailen Geffre MRN: [id number] Date of Birth: 08-01-1940

## 2020-01-16 ENCOUNTER — Emergency Department (HOSPITAL_COMMUNITY): Payer: Medicare Other

## 2020-01-16 ENCOUNTER — Encounter (HOSPITAL_COMMUNITY): Payer: Self-pay | Admitting: Emergency Medicine

## 2020-01-16 ENCOUNTER — Emergency Department (HOSPITAL_COMMUNITY)
Admission: EM | Admit: 2020-01-16 | Discharge: 2020-01-17 | Disposition: A | Discharge status: Home / Self Care | Payer: Medicare Other | Attending: Emergency Medicine | Admitting: Emergency Medicine

## 2020-01-16 ENCOUNTER — Other Ambulatory Visit: Payer: Self-pay

## 2020-01-16 DIAGNOSIS — Z87891 Personal history of nicotine dependence: Secondary | ICD-10-CM | POA: Diagnosis not present

## 2020-01-16 DIAGNOSIS — R109 Unspecified abdominal pain: Secondary | ICD-10-CM

## 2020-01-16 DIAGNOSIS — I10 Essential (primary) hypertension: Secondary | ICD-10-CM | POA: Diagnosis not present

## 2020-01-16 LAB — URINALYSIS, ROUTINE W REFLEX MICROSCOPIC
Bacteria, UA: NONE SEEN
Bilirubin Urine: NEGATIVE
Glucose, UA: NEGATIVE mg/dL
Hgb urine dipstick: NEGATIVE
Ketones, ur: 5 mg/dL — AB
Leukocytes,Ua: NEGATIVE
Nitrite: NEGATIVE
Protein, ur: 100 mg/dL — AB
Specific Gravity, Urine: 1.011 (ref 1.005–1.030)
pH: 7 (ref 5.0–8.0)

## 2020-01-16 MED ORDER — FENTANYL CITRATE (PF) 100 MCG/2ML IJ SOLN
50.0000 ug | Freq: Once | INTRAMUSCULAR | Status: AC
  Administered 2020-01-17: 50 ug via INTRAVENOUS
  Filled 2020-01-16: qty 2

## 2020-01-16 MED ORDER — ONDANSETRON HCL 4 MG/2ML IJ SOLN
4.0000 mg | Freq: Once | INTRAMUSCULAR | Status: AC
  Administered 2020-01-17: 4 mg via INTRAVENOUS
  Filled 2020-01-16: qty 2

## 2020-01-16 MED ORDER — LACTATED RINGERS IV BOLUS
1000.0000 mL | Freq: Once | INTRAVENOUS | Status: AC
  Administered 2020-01-17: 1000 mL via INTRAVENOUS

## 2020-01-16 NOTE — ED Notes (Signed)
Pt ambulatory to the bathroom with steady gait, resps even and unlabored, pt c/o L hip/flank pain

## 2020-01-16 NOTE — ED Triage Notes (Signed)
Pt from home via [facility], c/o left lower flank/abdominal pain "for a couple of hours" as well as diarrhea "for a couple of hours."

## 2020-01-17 LAB — CBC WITH DIFFERENTIAL/PLATELET
Abs Immature Granulocytes: 0.04 10*3/uL (ref 0.00–0.07)
Basophils Absolute: 0 10*3/uL (ref 0.0–0.1)
Basophils Relative: 0 %
Eosinophils Absolute: 0 10*3/uL (ref 0.0–0.5)
Eosinophils Relative: 0 %
HCT: 40.4 % (ref 39.0–52.0)
Hemoglobin: 13.5 g/dL (ref 13.0–17.0)
Immature Granulocytes: 1 %
Lymphocytes Relative: 8 %
Lymphs Abs: 0.6 10*3/uL — ABNORMAL LOW (ref 0.7–4.0)
MCH: 32.4 pg (ref 26.0–34.0)
MCHC: 33.4 g/dL (ref 30.0–36.0)
MCV: 96.9 fL (ref 80.0–100.0)
Monocytes Absolute: 0.4 10*3/uL (ref 0.1–1.0)
Monocytes Relative: 6 %
Neutro Abs: 5.9 10*3/uL (ref 1.7–7.7)
Neutrophils Relative %: 85 %
Platelets: 148 10*3/uL — ABNORMAL LOW (ref 150–400)
RBC: 4.17 MIL/uL — ABNORMAL LOW (ref 4.22–5.81)
RDW: 13.1 % (ref 11.5–15.5)
WBC: 6.9 10*3/uL (ref 4.0–10.5)
nRBC: 0 % (ref 0.0–0.2)

## 2020-01-17 LAB — LIPASE, BLOOD: Lipase: 35 U/L (ref 11–51)

## 2020-01-17 LAB — COMPREHENSIVE METABOLIC PANEL
ALT: 22 U/L (ref 0–44)
AST: 21 U/L (ref 15–41)
Albumin: 3.9 g/dL (ref 3.5–5.0)
Alkaline Phosphatase: 81 U/L (ref 38–126)
Anion gap: 8 (ref 5–15)
BUN: 15 mg/dL (ref 8–23)
CO2: 26 mmol/L (ref 22–32)
Calcium: 8.3 mg/dL — ABNORMAL LOW (ref 8.9–10.3)
Chloride: 103 mmol/L (ref 98–111)
Creatinine, Ser: 1.01 mg/dL (ref 0.61–1.24)
GFR calc Af Amer: >60 mL/min (ref >60)
GFR calc non Af Amer: >60 mL/min (ref >60)
Glucose, Bld: 134 mg/dL — ABNORMAL HIGH (ref 70–99)
Potassium: 3.7 mmol/L (ref 3.5–5.1)
Sodium: 137 mmol/L (ref 135–145)
Total Bilirubin: 0.8 mg/dL (ref 0.3–1.2)
Total Protein: 6.9 g/dL (ref 6.5–8.1)

## 2020-01-17 MED ORDER — IOHEXOL 300 MG/ML  SOLN
100.0000 mL | Freq: Once | INTRAMUSCULAR | Status: AC | PRN
  Administered 2020-01-17: 100 mL via INTRAVENOUS

## 2020-01-17 MED ORDER — OXYCODONE-ACETAMINOPHEN 5-325 MG PO TABS: 2.0000 | ORAL_TABLET | ORAL | 0 refills | Status: DC | PRN

## 2020-01-17 NOTE — ED Provider Notes (Signed)
[facility]GENCY DEPARTMENT Provider Note   CSN: [id number] Arrival date & time: 01/16/20  2208     History Chief Complaint  Patient presents with  . Flank Pain    David Carroll is a 79 y.o. male.  The history is provided by the patient and medical records.  Flank Pain This is a new problem. The current episode started 3 to 5 hours ago. The problem occurs constantly. The problem has not changed since onset.Associated symptoms include abdominal pain. Pertinent negatives include no chest pain, no headaches and no shortness of breath. Nothing aggravates the symptoms. Nothing relieves the symptoms. He has tried nothing for the symptoms. The treatment provided no relief.       Past Medical History:  Diagnosis Date  . Arthritis    KNEES  . BPH (benign prostatic hypertrophy)   . Complication of anesthesia SLOW RECOVERING FROM ANES. DRUGS   HX OPIATE ADDICTION-- PT ON SUBOXONE  . Depression   . Glaucoma   . History of gastric ulcer   . History of gastroesophageal reflux (GERD)   . History of kidney stones   . Hydronephrosis, left   . Hypertension   . Nephrolithiasis   . Opiate addiction (HCC)   . Urgency of urination     Patient Active Problem List   Diagnosis Date Noted  . Fall 09/27/2019  . Hypertension   . Benign prostatic hyperplasia   . Chronic back pain   . Fall at home, initial encounter   . Temporal bone fracture (HCC)   . SDH (subdural hematoma) (HCC)   . Marijuana abuse   . Alcohol abuse   . MDD (major depressive disorder) 09/18/2013  . Opiate dependence (HCC) 09/18/2013    Past Surgical History:  Procedure Laterality Date  . CATARACT EXTRACTION W/ INTRAOCULAR LENS  IMPLANT, BILATERAL    . CYSTOSCOPY WITH BIOPSY Left 11/06/2012   Procedure: CYSTOSCOPY WITH BIOPSY;  Surgeon: Sebastian Acheheodore Manny, MD;  [facility] [place];  Service: Urology;  Laterality: Left;  . EXTRACORPOREAL SHOCK WAVE LITHOTRIPSY Left 03-06-2011  . HOLMIUM LASER  APPLICATION Left 11/06/2012   Procedure: HOLMIUM LASER APPLICATION;  Surgeon: Sebastian Acheheodore Manny, MD;  [facility] [place];  Service: Urology;  Laterality: Left;  . INGUINAL HERNIA REPAIR Bilateral 1970   RE-DO LEFT 1985  . LUMBAR DISC SURGERY  09-24-2008   L3 -- L4  . LUMBAR LAMINECTOMY  05/15/2019   with partial facet joint removal  . MEDIAL COMPARTMENT ARTHROPLASTY, LEFT KNEE  02-22-2010  . REMOVAL BENIGN MASS LEFT LUNG  1977  . VAGOTOMY  1986   ULCERS       Family History  Problem Relation Age of Onset  . Depression Mother     Social History   Tobacco Use  . Smoking status: Former Smoker    Packs/day: 2.00    Years: 40.00    Pack years: 80.00    Types: Cigarettes    Quit date: 10/31/2006    Years since quitting: 13.2  . Smokeless tobacco: Never Used  Substance Use Topics  . Alcohol use: Yes    Alcohol/week: 7.0 standard drinks    Types: 7 Cans of beer per week  . Drug use: No    Comment: hx opiate addiction in past, stopped Suboxone 4 days ago    Home Medications Prior to Admission medications   Medication Sig Start Date End Date Taking? Authorizing Provider  ALPRAZolam Prudy Feeler(XANAX) 1 MG tablet Take 1 mg by mouth 3 (three)  times daily as needed. 09/17/19   [provider]  amLODipine (NORVASC) 10 MG tablet Take 10 mg by mouth daily.    [provider]  amphetamine-dextroamphetamine (ADDERALL) 15 MG tablet Take 30 mg by mouth daily. 30 mg ER    [provider]  carisoprodol (SOMA) 350 MG tablet Take 350 mg by mouth 3 (three) times daily as needed. 08/14/19   [provider]  chlorpheniramine (CHLOR-TRIMETON) 4 MG tablet Take 4 mg by mouth 2 (two) times daily as needed for allergies.    [provider]  Cholecalciferol (VITAMIN D) 2000 UNITS tablet Take 2,000 Units by mouth daily.    [provider]  co-enzyme Q-10 30 MG capsule Take 30 mg by mouth at bedtime.     [provider]  diazepam (VALIUM) 10  MG tablet Take 10 mg by mouth every 6 (six) hours as needed. 09/08/19   [provider]  doxazosin (CARDURA XL) 8 MG 24 hr tablet Take 8 mg by mouth at bedtime.     [provider]  furosemide (LASIX) 40 MG tablet Take 40 mg by mouth daily. 08/24/19   [provider]  ibuprofen (ADVIL,MOTRIN) 800 MG tablet Take 600 mg by mouth every 6 (six) hours as needed for moderate pain (pain).     [provider]  levETIRAcetam (KEPPRA) 500 MG tablet Take 1 tablet (500 mg total) by mouth 2 (two) times daily for 7 days. 09/28/19 10/05/19  Amin, Ankit Chirag, MD  montelukast (SINGULAIR) 10 MG tablet Take 10 mg by mouth daily. 09/24/19   [provider]  oxycodone (OXY-IR) 5 MG capsule Take 1 capsule (5 mg total) by mouth every 4 (four) hours as needed. 09/28/19   Amin, Loura Halt, MD  oxyCODONE-acetaminophen (PERCOCET) 5-325 MG tablet Take 2 tablets by mouth every 4 (four) hours as needed. 01/17/20   Wretha Laris, Barbara Cower, MD  psyllium (METAMUCIL) 58.6 % powder Take 1 packet by mouth daily.    [provider]  sennosides-docusate sodium (SENOKOT-S) 8.6-50 MG tablet Take 1 tablet by mouth 2 (two) times daily. While taking pain meds to prevent constipation. Patient taking differently: Take 1 tablet by mouth daily as needed for constipation (constipation). While taking pain meds to prevent constipation. 11/06/12   Sebastian Ache, MD  sertraline (ZOLOFT) 100 MG tablet Take 100 mg by mouth daily.     [provider]    Allergies    Patient has no known allergies.  Review of Systems   Review of Systems  Respiratory: Negative for shortness of breath.   Cardiovascular: Negative for chest pain.  Gastrointestinal: Positive for abdominal pain.  Genitourinary: Positive for flank pain.  Neurological: Negative for headaches.  All other systems reviewed and are negative.   Physical Exam Updated Vital Signs BP (!) 151/93 (BP Location: Left Arm)   Pulse 82   Temp 97.8  F (36.6 C) (Oral)   Resp 20   SpO2 96%   Physical Exam Vitals and nursing note reviewed.  Constitutional:      Appearance: He is well-developed.  HENT:     Head: Normocephalic and atraumatic.     Mouth/Throat:     Mouth: Mucous membranes are moist.     Pharynx: Oropharynx is clear.  Eyes:     Pupils: Pupils are equal, round, and reactive to light.  Cardiovascular:     Rate and Rhythm: Normal rate.  Pulmonary:     Effort: Pulmonary effort is normal. No respiratory distress.  Abdominal:     General: Abdomen is flat. There is no distension.  Musculoskeletal:        General: Normal range of motion.     Cervical back: Normal range of motion.  Skin:    General: Skin is warm and dry.  Neurological:     General: No focal deficit present.     Mental Status: He is alert.     ED Results / Procedures / Treatments   Labs (all labs ordered are listed, but only abnormal results are displayed) Labs Reviewed  URINALYSIS, ROUTINE W REFLEX MICROSCOPIC - Abnormal; Notable for the following components:      Result Value   Color, Urine STRAW (*)    Ketones, ur 5 (*)    Protein, ur 100 (*)    All other components within normal limits  CBC WITH DIFFERENTIAL/PLATELET - Abnormal; Notable for the following components:   RBC 4.17 (*)    Platelets 148 (*)    Lymphs Abs 0.6 (*)    All other components within normal limits  COMPREHENSIVE METABOLIC PANEL - Abnormal; Notable for the following components:   Glucose, Bld 134 (*)    Calcium 8.3 (*)    All other components within normal limits  LIPASE, BLOOD    EKG None  Radiology CT ABDOMEN PELVIS W CONTRAST  Result Date: 01/17/2020 CLINICAL DATA:  Left lower flank pain and abdominal pain for couple of hours. Diarrhea. Suspect diverticulitis. EXAM: CT ABDOMEN AND PELVIS WITH CONTRAST TECHNIQUE: Multidetector CT imaging of the abdomen and pelvis was performed using the standard protocol following bolus administration of intravenous contrast.  CONTRAST:  OMNIPAQUE IOHEXOL 300 MG/ML  SOLN COMPARISON:  01/28/2015 FINDINGS: Lower chest: Atelectasis in the lung bases. Cardiac enlargement. Small pericardial effusions. Aortic and coronary artery calcifications. Surgical clips at the EG junction. Hepatobiliary: Scattered low-attenuation lesions in the liver, likely cysts. No change since prior study. Gallbladder and bile ducts are unremarkable. Pancreas: Unremarkable. No pancreatic ductal dilatation or surrounding inflammatory changes. Spleen: Normal in size without focal abnormality. Adrenals/Urinary Tract: Left adrenal gland nodule measuring 1.8 cm diameter. No change since prior study. Atrophic left kidney with calcifications in the lower pole, possibly dystrophic or stones. Bilateral renal cysts. Parenchymal nephrograms are symmetrical. Left hydronephrosis without significant hydroureter. This is similar to prior study and may indicate ureteropelvic junction obstruction. Infiltration at the region of the ureteropelvic junction may indicate an inflammatory process. The bladder is unremarkable. Stomach/Bowel: Stomach, small bowel, and colon are decompressed. No wall thickening or inflammatory changes are appreciated. Appendix is normal. Vascular/Lymphatic: Aortic atherosclerosis. No enlarged abdominal or pelvic lymph nodes. Reproductive: Prostate gland is enlarged, measuring 5.3 cm diameter. Other: No abdominal wall hernia or abnormality. No abdominopelvic ascites. Musculoskeletal: Degenerative changes in the spine. Spondylolysis with mild spondylolisthesis at L5-S1. No destructive bone lesions. IMPRESSION: 1. Left hydronephrosis without significant hydroureter. This is similar to prior study and may indicate ureteropelvic junction obstruction. Infiltration at the region of the ureteropelvic junction suggests an inflammatory process. 2. Atrophic left kidney with calcifications in the lower pole, possibly dystrophic or stones. Bilateral renal cysts. 3.  Left adrenal gland nodule is unchanged since prior study. 4. Enlarged prostate gland. 5. Small pericardial effusions. 6. Aortic atherosclerosis. 7. Spondylolysis with mild spondylolisthesis at L5-S1. Aortic Atherosclerosis (ICD10-I70.0). Electronically Signed   By: Burman Nieves M.D.   On: 01/17/2020 02:40    Procedures Procedures (including critical care time)  Medications Ordered in ED Medications  fentaNYL (SUBLIMAZE) injection 50 mcg (50  mcg Intravenous Given 01/17/20 0008)  ondansetron (ZOFRAN) injection 4 mg (4 mg Intravenous Given 01/17/20 0010)  lactated ringers bolus 1,000 mL (0 mLs Intravenous Stopped 01/17/20 0115)  iohexol (OMNIPAQUE) 300 MG/ML solution 100 mL (100 mLs Intravenous Contrast Given 01/17/20 0229)    ED Course  I have reviewed the triage vital signs and the nursing notes.  Pertinent labs & imaging results that were available during my care of the patient were reviewed by me and considered in my medical decision making (see chart for details).    MDM Rules/Calculators/A&P                          Possibly passed stone? Some other inflammatory process? Will follow up with urology.   Final Clinical Impression(s) / ED Diagnoses Final diagnoses:  Flank pain    Rx / DC Orders ED Discharge Orders         Ordered    oxyCODONE-acetaminophen (PERCOCET) 5-325 MG tablet  Every 4 hours PRN     Discontinue  Reprint     01/17/20 0347           Asiah Browder, Barbara Cower, MD 01/17/20 [phone]

## 2020-11-18 ENCOUNTER — Encounter: Payer: Self-pay | Admitting: Physical Therapy

## 2020-11-18 ENCOUNTER — Ambulatory Visit: Payer: Medicare Other | Attending: Family Medicine | Admitting: Physical Therapy

## 2020-11-18 ENCOUNTER — Other Ambulatory Visit: Payer: Self-pay

## 2020-11-18 DIAGNOSIS — R293 Abnormal posture: Secondary | ICD-10-CM | POA: Diagnosis present

## 2020-11-18 DIAGNOSIS — G8929 Other chronic pain: Secondary | ICD-10-CM | POA: Diagnosis present

## 2020-11-18 DIAGNOSIS — M5442 Lumbago with sciatica, left side: Secondary | ICD-10-CM | POA: Insufficient documentation

## 2020-11-18 NOTE — Therapy (Signed)
[facility] [address] [place], [place], [address] Phone: [phone]   Fax:  [phone]  Physical Therapy Evaluation  Patient Details  Name: David Carroll MRN: [id number] Date of Birth: [birth year] Referring Provider (PT): Delano Metz MD   Encounter Date: 11/18/2020   PT End of Session - 11/18/20 1452     Visit Number 1    Number of Visits 12    Date for PT Re-Evaluation 02/16/21    PT Start Time 0234    PT Stop Time 0301    PT Time Calculation (min) 27 min    Activity Tolerance Patient tolerated treatment well    Behavior During Therapy [facility] for tasks assessed/performed             Past Medical History:  Diagnosis Date   Arthritis    KNEES   BPH (benign prostatic hypertrophy)    Complication of anesthesia SLOW RECOVERING FROM ANES. DRUGS   HX OPIATE ADDICTION-- PT ON SUBOXONE   Depression    Glaucoma    History of gastric ulcer    History of gastroesophageal reflux (GERD)    History of kidney stones    Hydronephrosis, left    Hypertension    Nephrolithiasis    Opiate addiction (HCC)    Urgency of urination     Past Surgical History:  Procedure Laterality Date   CATARACT EXTRACTION W/ INTRAOCULAR LENS  IMPLANT, BILATERAL     CYSTOSCOPY WITH BIOPSY Left 11/06/2012   Procedure: CYSTOSCOPY WITH BIOPSY;  Surgeon: Sebastian Ache, MD;  Location: [facility];  Service: Urology;  Laterality: Left;   EXTRACORPOREAL SHOCK WAVE LITHOTRIPSY Left 03-06-2011   HOLMIUM LASER APPLICATION Left 11/06/2012   Procedure: HOLMIUM LASER APPLICATION;  Surgeon: Sebastian Ache, MD;  Location: [facility];  Service: Urology;  Laterality: Left;   INGUINAL HERNIA REPAIR Bilateral 1970   RE-DO LEFT 1985   LUMBAR DISC SURGERY  09-24-2008   L3 -- L4   LUMBAR LAMINECTOMY  05/15/2019   with partial facet joint removal   MEDIAL COMPARTMENT ARTHROPLASTY, LEFT KNEE  02-22-2010   REMOVAL BENIGN MASS LEFT LUNG  1977    VAGOTOMY  1986   ULCERS    There were no vitals filed for this visit.    Subjective Assessment - 11/18/20 1454     Subjective COVID-19 screen performed prior to patient entering clinic.  The patient presents to the clinic today with c/o back pain and generalized feeling of weakness.  Since last seen in PT he has been performing exercises at home but feels he needs a more comprehensive program including U/LE and core work.  His pain-level is quite low today rated.  He reports he has had no recent falls but it is recoomended he use at least a cane for extra stability.    Pertinent History Arthrits, h/o piriformis syndrome and sciatica, left knee surgery, lumbar surgery,    How long can you walk comfortably? Short community distances.    Patient Stated Goals Get stronger.    Currently in Pain? Yes    Pain Score 2     Pain Location Back    Pain Orientation Lower    Pain Descriptors / Indicators Aching;Dull    Pain Type Chronic pain    Pain Onset More than a month ago    Pain Frequency Intermittent                [facility] PT Assessment - 11/18/20 0001  Assessment   Medical Diagnosis Knee pain, low back pain, sciatica, foot pain.    Referring Provider (PT) Royston Sinner Corrington MD    Onset Date/Surgical Date --   Ongoing.     Precautions   Precautions Fall    Precaution Comments Please be with patient at all times due to his risk of falls.      Restrictions   Weight Bearing Restrictions No      Balance Screen   Has the patient fallen in the past 6 months No    Has the patient had a decrease in activity level because of a fear of falling?  Yes    Is the patient reluctant to leave their home because of a fear of falling?  No      Home Environment   Living Environment Private residence      Prior Function   Level of Independence Independent      Posture/Postural Control   Posture/Postural Control Postural limitations    Postural Limitations Rounded Shoulders;Forward  head;Flexed trunk      ROM / Strength   AROM / PROM / Strength AROM;Strength      AROM   Overall AROM Comments WF/NL for bilateral U and LE's.      Strength   Overall Strength Comments Via manual muscle testing the patient demonstrated 5/5 strength of the majors muscle groups of his U and LE's.      Special Tests   Other special tests (-) Romberg test.      Ambulation/Gait   Gait Comments The patient walks in slight trunk flexion with a wide base of support.                        Objective measurements completed on examination: See above findings.                    PT Long Term Goals - 11/18/20 1555       PT LONG TERM GOAL #1   Title Patient will be independent with HEP    Time 6    Period Weeks    Status New      PT LONG TERM GOAL #2   Title Walk walk a community distance without resting.    Time 6    Period Weeks    Status New                    Plan - 11/18/20 1549     Clinical Impression Statement The patient presents to OPPT with c/o generalized weakness and some low back pain.  Since last seen in PT he has been exercising at home and feel a change in medication has been very helpful.  He reports no recent falls but use of at least a cane is strongly recommended.  The patient's U and LE AROM is WF/NL.  His gait is remarkable for a wide base of support.  He would like to establish a comprehensive exercise program.  He demonstrated a negative Romberg test today. Patient will benefit from skilled physical therapy intervention to address pain and deficits.    Personal Factors and Comorbidities Comorbidity 1;Other    Comorbidities Arthrits, h/o piriformis syndrome and sciatica, left knee surgery, lumbar surgery,    Examination-Activity Limitations Other;Locomotion Level    Examination-Participation Restrictions Other    Stability/Clinical Decision Making Evolving/Moderate complexity    Clinical Decision Making Low    Rehab  Potential Good  PT Frequency 2x / week    PT Duration 4 weeks    PT Treatment/Interventions ADLs/Self Care Home Management;Cryotherapy;Electrical Stimulation;Ultrasound;Moist Heat;Gait training;Stair training;Functional mobility training;Therapeutic activities;Therapeutic exercise;Balance training;Neuromuscular re-education;Manual techniques;Patient/family education    PT Next Visit Plan Comprehensive U and LE ther ex to include core exercises and balaance/gait activities.    Consulted and Agree with Plan of Care Patient             Patient will benefit from skilled therapeutic intervention in order to improve the following deficits and impairments:  Abnormal gait, Decreased activity tolerance, Pain  Visit Diagnosis: Chronic bilateral low back pain with left-sided sciatica - Plan: PT plan of care cert/re-cert  Abnormal posture - Plan: PT plan of care cert/re-cert     Problem List Patient Active Problem List   Diagnosis Date Noted   Fall 09/27/2019   Hypertension    Benign prostatic hyperplasia    Chronic back pain    Fall at home, initial encounter    Temporal bone fracture (HCC)    SDH (subdural hematoma) (HCC)    Marijuana abuse    Alcohol abuse    MDD (major depressive disorder) 09/18/2013   Opiate dependence (HCC) 09/18/2013    Trinady Milewski, [place] MPT 11/18/2020, 3:58 PM  [facility] [address] [place], [place], [address] Phone: [phone]   Fax:  [phone]  Name: David Carroll MRN: [id number] Date of Birth: 17-Oct-1940

## 2020-11-22 ENCOUNTER — Ambulatory Visit: Payer: Medicare Other | Admitting: Physical Therapy

## 2020-11-25 ENCOUNTER — Encounter: Payer: Medicare Other | Admitting: Physical Therapy

## 2020-11-30 ENCOUNTER — Ambulatory Visit: Payer: Medicare Other | Attending: Sports Medicine | Admitting: Physical Therapy

## 2020-11-30 ENCOUNTER — Other Ambulatory Visit: Payer: Self-pay

## 2020-11-30 ENCOUNTER — Encounter: Payer: Self-pay | Admitting: Physical Therapy

## 2020-11-30 DIAGNOSIS — M25611 Stiffness of right shoulder, not elsewhere classified: Secondary | ICD-10-CM | POA: Insufficient documentation

## 2020-11-30 DIAGNOSIS — M25511 Pain in right shoulder: Secondary | ICD-10-CM | POA: Diagnosis not present

## 2020-11-30 NOTE — Therapy (Signed)
[facility] [address] [place], [place], [address] Phone: [phone]   Fax:  [phone]  Physical Therapy Evaluation  Patient Details  Name: David Carroll MRN: [id number] Date of Birth: February 20, 1941 Referring Provider (PT): Elinor Dodge MD   Encounter Date: 11/30/2020   PT End of Session - 11/30/20 1321     Visit Number 1    Number of Visits 12    Date for PT Re-Evaluation 02/16/21    Authorization Type FOTO AT LEAST EVERY 5TH VISIT.  PROGRESS NOTE AT 10TH VISIT.  KX MODIFIER AFTER 15 VISITS.    PT Start Time 0104    PT Stop Time 0145    PT Time Calculation (min) 41 min    Activity Tolerance Patient tolerated treatment well    Behavior During Therapy WFL for tasks assessed/performed             Past Medical History:  Diagnosis Date   Arthritis    KNEES   BPH (benign prostatic hypertrophy)    Complication of anesthesia SLOW RECOVERING FROM ANES. DRUGS   HX OPIATE ADDICTION-- PT ON SUBOXONE   Depression    Glaucoma    History of gastric ulcer    History of gastroesophageal reflux (GERD)    History of kidney stones    Hydronephrosis, left    Hypertension    Nephrolithiasis    Opiate addiction (HCC)    Urgency of urination     Past Surgical History:  Procedure Laterality Date   CATARACT EXTRACTION W/ INTRAOCULAR LENS  IMPLANT, BILATERAL     CYSTOSCOPY WITH BIOPSY Left 11/06/2012   Procedure: CYSTOSCOPY WITH BIOPSY;  Surgeon: Sebastian Ache, MD;  Location: [facility];  Service: Urology;  Laterality: Left;   EXTRACORPOREAL SHOCK WAVE LITHOTRIPSY Left 03-06-2011   HOLMIUM LASER APPLICATION Left 11/06/2012   Procedure: HOLMIUM LASER APPLICATION;  Surgeon: Sebastian Ache, MD;  Location: [facility];  Service: Urology;  Laterality: Left;   INGUINAL HERNIA REPAIR Bilateral 1970   RE-DO LEFT 1985   LUMBAR DISC SURGERY  09-24-2008   L3 -- L4   LUMBAR LAMINECTOMY  05/15/2019   with  partial facet joint removal   MEDIAL COMPARTMENT ARTHROPLASTY, LEFT KNEE  02-22-2010   REMOVAL BENIGN MASS LEFT LUNG  1977   VAGOTOMY  1986   ULCERS    There were no vitals filed for this visit.    Subjective Assessment - 11/30/20 1323     Subjective COVID-19 screen performed prior to patient entering clinic.    The patient reports tripping on a kitchen stool on 11/21/20.  This resulted in a fall tha dislocated his right shoulder.  He presented to the hospital to have his shoudler relocated.  His pain-level is a 6/10 and higher with attempted movement of his right shoulder.  Keeping his right arm by his side decreases his pain.  The patient is well known to [place] and it is recommended he use at least a cane for safer ambulation.    Pertinent History Arthrits, h/o piriformis syndrome and sciatica, left knee surgery, lumbar surgery,    How long can you walk comfortably? Short community distances.    Patient Stated Goals Use right arm again without pain.    Currently in Pain? Yes    Pain Score 6     Pain Location Shoulder    Pain Orientation Right    Pain Descriptors / Indicators Sore;Throbbing;Aching    Pain Onset 1 to 4 weeks ago  Aggravating Factors  See above.    Pain Relieving Factors See above.                [facility] PT Assessment - 11/30/20 0001       Assessment   Medical Diagnosis Dislocation of right shoulder.    Referring Provider (PT) Elinor Dodge MD    Onset Date/Surgical Date 11/21/20      Precautions   Precaution Comments Please be with patient at all times.      Restrictions   Weight Bearing Restrictions No      Balance Screen   Has the patient fallen in the past 6 months Yes    How many times? 1.    Has the patient had a decrease in activity level because of a fear of falling?  Yes    Is the patient reluctant to leave their home because of a fear of falling?  No      Home Environment   Living Environment Private residence      Prior Function    Level of Independence Independent      Observation/Other Assessments   Observations Significant ecchymosis into right upper arm and right chest wall.    Focus on Therapeutic Outcomes (FOTO)  Complete.      Posture/Postural Control   Posture/Postural Control Postural limitations    Postural Limitations Rounded Shoulders;Forward head      ROM / Strength   AROM / PROM / Strength PROM      AROM   Overall AROM Comments PAROM right shoulder f=lexion to 112 degrees and ER to 30 degrees.      Strength   Overall Strength Comments Right shoulder IR/ER and deltoid strength not graded higher than a 2+ to 3-/5 decreases certainly in part due to pain.      Palpation   Palpation comment Tender to palpation in middle deltoid region and referred pain more distally than this region.      Ambulation/Gait   Gait Comments Highly recommend patient use at least a cane for safer ambulation.                        Objective measurements completed on examination: See above findings.       [facility] Adult PT Treatment/Exercise - 11/30/20 0001       Modalities   Modalities Electrical Stimulation;Vasopneumatic      Electrical Stimulation   Electrical Stimulation Location Right shoudler.    Electrical Stimulation Action IFC at 80-150 Hz.    Electrical Stimulation Parameters 40% scan x 15 minutes.      Vasopneumatic   Number Minutes Vasopneumatic  15 minutes    Vasopnuematic Location  --   Right shoulder.   Vasopneumatic Pressure Low                         PT Long Term Goals - 11/30/20 1353       PT LONG TERM GOAL #1   Title Independent with a HEP.    Time 6    Period Weeks    Status New      PT LONG TERM GOAL #2   Title Active right shoulder flexion to 145 degrees so the patient can easily reach overhead.    Time 6    Status New      PT LONG TERM GOAL #3   Title Active ER to 70 degrees+ to allow for easily donning/doffing of  apparel.    Time 6    Period  Weeks    Status New      PT LONG TERM GOAL #4   Title Increase right shoulder strength to a solid 4 to 4+/5 to increase stability for performance of functional activities.    Time 6    Period Weeks    Status New      PT LONG TERM GOAL #5   Title Perform ADL's with pain not > 3/10.    Time 6    Period Weeks    Status New                    Plan - 11/30/20 1332     Clinical Impression Statement The patient presents to OPPT with a diagnosis of right shoulder dislocation due to a fall on 11/21/20.  His shoulder was relocated.  He currenly has a significant loss of right shoulder movment.  She shoulder is weak but this is certainly in part due to his curre3ntl pain-level.  He has pain complaints into his right middle deltoid and referred pain as well as some tingling in the fingers of his right hand.  He currently has a functional loss of usage of his right UE.  Patient will benefit from skilled physical therapy intervention to address pain and deficits.    Personal Factors and Comorbidities Comorbidity 1;Other    Comorbidities Arthrits, h/o piriformis syndrome and sciatica, left knee surgery, lumbar surgery,    Examination-Activity Limitations Other;Reach Overhead    Examination-Participation Restrictions Other    Stability/Clinical Decision Making Stable/Uncomplicated    Clinical Decision Making Low    Rehab Potential Good    PT Frequency 2x / week    PT Duration 6 weeks    PT Treatment/Interventions ADLs/Self Care Home Management;Cryotherapy;Electrical Stimulation;Ultrasound;Moist Heat;Iontophoresis 4mg /ml Dexamethasone;Therapeutic activities;Therapeutic exercise;Manual techniques;Patient/family education;Passive range of motion;Vasopneumatic Device    PT Next Visit Plan Begin with right shoulder p, PAROM, progress to AAROM, modalties to decrease pain.    Consulted and Agree with Plan of Care Patient             Patient will benefit from skilled therapeutic intervention  in order to improve the following deficits and impairments:  Pain, Decreased activity tolerance, Decreased strength, Decreased range of motion  Visit Diagnosis: Acute pain of right shoulder - Plan: PT plan of care cert/re-cert  Stiffness of right shoulder, not elsewhere classified - Plan: PT plan of care cert/re-cert     Problem List Patient Active Problem List   Diagnosis Date Noted   Fall 09/27/2019   Hypertension    Benign prostatic hyperplasia    Chronic back pain    Fall at home, initial encounter    Temporal bone fracture (HCC)    SDH (subdural hematoma) (HCC)    Marijuana abuse    Alcohol abuse    MDD (major depressive disorder) 09/18/2013   Opiate dependence (HCC) 09/18/2013    David Carroll, 11/18/2013 MPT 11/30/2020, 1:56 PM  [facility] [address] [place], [place], [place] Phone: [phone]   Fax:  [phone]  Name: David Carroll MRN: Geronimo Boot Date of Birth: April 19, 1941

## 2020-12-02 ENCOUNTER — Encounter: Payer: Self-pay | Admitting: Physical Therapy

## 2020-12-02 ENCOUNTER — Other Ambulatory Visit: Payer: Self-pay

## 2020-12-02 ENCOUNTER — Ambulatory Visit: Payer: Medicare Other | Admitting: Physical Therapy

## 2020-12-02 DIAGNOSIS — M25611 Stiffness of right shoulder, not elsewhere classified: Secondary | ICD-10-CM

## 2020-12-02 DIAGNOSIS — M25511 Pain in right shoulder: Secondary | ICD-10-CM

## 2020-12-02 NOTE — Therapy (Signed)
[facility] [address] [place], [place], [address] Phone: [phone]   Fax:  [phone]  Physical Therapy Treatment  Patient Details  Name: David Carroll MRN: [id number] Date of Birth: Sep 19, 1940 Referring Provider (PT): Elinor Dodge MD   Encounter Date: 12/02/2020   PT End of Session - 12/02/20 1336     Visit Number 2    Number of Visits 12    Date for PT Re-Evaluation 02/16/21    Authorization Type FOTO AT LEAST EVERY 5TH VISIT.  PROGRESS NOTE AT 10TH VISIT.  KX MODIFIER AFTER 15 VISITS.    PT Start Time 1301    PT Stop Time 1346    PT Time Calculation (min) 45 min    Activity Tolerance Patient tolerated treatment well    Behavior During Therapy WFL for tasks assessed/performed             Past Medical History:  Diagnosis Date   Arthritis    KNEES   BPH (benign prostatic hypertrophy)    Complication of anesthesia SLOW RECOVERING FROM ANES. DRUGS   HX OPIATE ADDICTION-- PT ON SUBOXONE   Depression    Glaucoma    History of gastric ulcer    History of gastroesophageal reflux (GERD)    History of kidney stones    Hydronephrosis, left    Hypertension    Nephrolithiasis    Opiate addiction (HCC)    Urgency of urination     Past Surgical History:  Procedure Laterality Date   CATARACT EXTRACTION W/ INTRAOCULAR LENS  IMPLANT, BILATERAL     CYSTOSCOPY WITH BIOPSY Left 11/06/2012   Procedure: CYSTOSCOPY WITH BIOPSY;  Surgeon: Sebastian Ache, MD;  Location: [facility];  Service: Urology;  Laterality: Left;   EXTRACORPOREAL SHOCK WAVE LITHOTRIPSY Left 03-06-2011   HOLMIUM LASER APPLICATION Left 11/06/2012   Procedure: HOLMIUM LASER APPLICATION;  Surgeon: Sebastian Ache, MD;  Location: [facility];  Service: Urology;  Laterality: Left;   INGUINAL HERNIA REPAIR Bilateral 1970   RE-DO LEFT 1985   LUMBAR DISC SURGERY  09-24-2008   L3 -- L4   LUMBAR LAMINECTOMY  05/15/2019   with  partial facet joint removal   MEDIAL COMPARTMENT ARTHROPLASTY, LEFT KNEE  02-22-2010   REMOVAL BENIGN MASS LEFT LUNG  1977   VAGOTOMY  1986   ULCERS    There were no vitals filed for this visit.   Subjective Assessment - 12/02/20 1301     Subjective COVID-19 screen performed prior to patient entering clinic. Reports shoulder is very sore but thinks he did some yardwork that he shouldn't have.    Pertinent History Arthrits, h/o piriformis syndrome and sciatica, left knee surgery, lumbar surgery,    How long can you walk comfortably? Short community distances.    Patient Stated Goals Use right arm again without pain.    Currently in Pain? Yes    Pain Score 7     Pain Location Shoulder    Pain Orientation Right    Pain Descriptors / Indicators Sore    Pain Type Chronic pain    Pain Onset 1 to 4 weeks ago    Pain Frequency Intermittent                [facility] PT Assessment - 12/02/20 0001       Assessment   Medical Diagnosis Dislocation of right shoulder.    Referring Provider (PT) Elinor Dodge MD    Onset Date/Surgical Date 11/21/20  Next MD Visit 12/2020      Precautions   Precautions Fall    Precaution Comments Please be with patient at all times.      Restrictions   Weight Bearing Restrictions No                           [facility] Adult PT Treatment/Exercise - 12/02/20 0001       Modalities   Modalities Vasopneumatic      Vasopneumatic   Number Minutes Vasopneumatic  15 minutes    Vasopnuematic Location  Shoulder    Vasopneumatic Pressure Low    Vasopneumatic Temperature  34/pain      Manual Therapy   Manual Therapy Passive ROM    Passive ROM PROM of R shoulder into flex, ER, IR with gentle holds at end range; oscillations provided to reduce guarding                         PT Long Term Goals - 11/30/20 1353       PT LONG TERM GOAL #1   Title Independent with a HEP.    Time 6    Period Weeks    Status New       PT LONG TERM GOAL #2   Title Active right shoulder flexion to 145 degrees so the patient can easily reach overhead.    Time 6    Status New      PT LONG TERM GOAL #3   Title Active ER to 70 degrees+ to allow for easily donning/doffing of apparel.    Time 6    Period Weeks    Status New      PT LONG TERM GOAL #4   Title Increase right shoulder strength to a solid 4 to 4+/5 to increase stability for performance of functional activities.    Time 6    Period Weeks    Status New      PT LONG TERM GOAL #5   Title Perform ADL's with pain not > 3/10.    Time 6    Period Weeks    Status New                   Plan - 12/02/20 1336     Clinical Impression Statement Patient presented in clinic with reports of greater soreness of RUE today. Patient reports doing yardwork that he shouldn't have. No AD used by patient upon arrival into clinic. Firm end feels and smooth arc of motion noted during PROM of R shoulder. PTA provided oscillations of RUE to reduce muscle guarding and pain. Ecchymosis oberved over anterior R biceps and along R eye. Patient required mod assist in transitioning from supine > sit and required CGA in order to regain balance when transferring from plinth to chair. Patient had velcro strapped sandals donned. Normal vasopneumatic response noted following removal of the modality.    Personal Factors and Comorbidities Comorbidity 1;Other    Comorbidities Arthrits, h/o piriformis syndrome and sciatica, left knee surgery, lumbar surgery,    Examination-Activity Limitations Other;Reach Overhead    Examination-Participation Restrictions Other    Stability/Clinical Decision Making Stable/Uncomplicated    Rehab Potential Good    PT Frequency 2x / week    PT Duration 6 weeks    PT Treatment/Interventions ADLs/Self Care Home Management;Cryotherapy;Electrical Stimulation;Ultrasound;Moist Heat;Iontophoresis 4mg /ml Dexamethasone;Therapeutic activities;Therapeutic exercise;Manual  techniques;Patient/family education;Passive range of motion;Vasopneumatic Device    PT Next  Visit Plan Begin with right shoulder p, PAROM, progress to AAROM, modalties to decrease pain.    Consulted and Agree with Plan of Care Patient             Patient will benefit from skilled therapeutic intervention in order to improve the following deficits and impairments:  Pain, Decreased activity tolerance, Decreased strength, Decreased range of motion  Visit Diagnosis: Acute pain of right shoulder  Stiffness of right shoulder, not elsewhere classified     Problem List Patient Active Problem List   Diagnosis Date Noted   Fall 09/27/2019   Hypertension    Benign prostatic hyperplasia    Chronic back pain    Fall at home, initial encounter    Temporal bone fracture (HCC)    SDH (subdural hematoma) (HCC)    Marijuana abuse    Alcohol abuse    MDD (major depressive disorder) 09/18/2013   Opiate dependence (HCC) 09/18/2013    Marvell Fuller, PTA 12/02/2020, 1:55 PM  [facility] [address] [place], [place], [address] Phone: [phone]   Fax:  [phone]  Name: Maureen Duesing MRN: [id number] Date of Birth: 08-07-1940

## 2020-12-06 ENCOUNTER — Other Ambulatory Visit: Payer: Self-pay

## 2020-12-06 ENCOUNTER — Ambulatory Visit: Payer: Medicare Other | Admitting: Physical Therapy

## 2020-12-06 ENCOUNTER — Encounter: Payer: Self-pay | Admitting: Physical Therapy

## 2020-12-06 DIAGNOSIS — M25611 Stiffness of right shoulder, not elsewhere classified: Secondary | ICD-10-CM

## 2020-12-06 DIAGNOSIS — M25511 Pain in right shoulder: Secondary | ICD-10-CM | POA: Diagnosis not present

## 2020-12-06 NOTE — Therapy (Signed)
[facility] [address] [place], [place], [address] Phone: [phone]   Fax:  [phone]  Physical Therapy Treatment  Patient Details  Name: Markeise Mathews MRN: [id number] Date of Birth: [birth year] Referring Provider (PT): Elinor Dodge MD   Encounter Date: 12/06/2020   PT End of Session - 12/06/20 1304     Visit Number 3    Number of Visits 12    Date for PT Re-Evaluation 02/16/21    Authorization Type FOTO AT LEAST EVERY 5TH VISIT.  PROGRESS NOTE AT 10TH VISIT.  KX MODIFIER AFTER 15 VISITS.    PT Start Time 1304    PT Stop Time 1336    PT Time Calculation (min) 32 min    Activity Tolerance Patient tolerated treatment well    Behavior During Therapy WFL for tasks assessed/performed             Past Medical History:  Diagnosis Date   Arthritis    KNEES   BPH (benign prostatic hypertrophy)    Complication of anesthesia SLOW RECOVERING FROM ANES. DRUGS   HX OPIATE ADDICTION-- PT ON SUBOXONE   Depression    Glaucoma    History of gastric ulcer    History of gastroesophageal reflux (GERD)    History of kidney stones    Hydronephrosis, left    Hypertension    Nephrolithiasis    Opiate addiction (HCC)    Urgency of urination     Past Surgical History:  Procedure Laterality Date   CATARACT EXTRACTION W/ INTRAOCULAR LENS  IMPLANT, BILATERAL     CYSTOSCOPY WITH BIOPSY Left 11/06/2012   Procedure: CYSTOSCOPY WITH BIOPSY;  Surgeon: Sebastian Ache, MD;  Location: [facility];  Service: Urology;  Laterality: Left;   EXTRACORPOREAL SHOCK WAVE LITHOTRIPSY Left 03-06-2011   HOLMIUM LASER APPLICATION Left 11/06/2012   Procedure: HOLMIUM LASER APPLICATION;  Surgeon: Sebastian Ache, MD;  Location: [facility];  Service: Urology;  Laterality: Left;   INGUINAL HERNIA REPAIR Bilateral 1970   RE-DO LEFT 1985   LUMBAR DISC SURGERY  09-24-2008   L3 -- L4   LUMBAR LAMINECTOMY  05/15/2019   with  partial facet joint removal   MEDIAL COMPARTMENT ARTHROPLASTY, LEFT KNEE  02-22-2010   REMOVAL BENIGN MASS LEFT LUNG  1977   VAGOTOMY  1986   ULCERS    There were no vitals filed for this visit.   Subjective Assessment - 12/06/20 1303     Subjective COVID-19 screen performed prior to patient entering clinic. Reports shoulder is not as sore as it has been. Going to the beach in a few weeks and was hoping to be able to fish.    Pertinent History Arthrits, h/o piriformis syndrome and sciatica, left knee surgery, lumbar surgery,    How long can you walk comfortably? Short community distances.    Patient Stated Goals Use right arm again without pain.    Currently in Pain? Yes    Pain Score 4     Pain Location Shoulder    Pain Orientation Right    Pain Descriptors / Indicators Sore    Pain Type Acute pain    Pain Onset 1 to 4 weeks ago    Pain Frequency Intermittent                [facility] PT Assessment - 12/06/20 0001       Assessment   Medical Diagnosis Dislocation of right shoulder.    Referring Provider (PT) Elinor Dodge  MD    Onset Date/Surgical Date 11/21/20    Next MD Visit 12/2020      Precautions   Precautions Fall    Precaution Comments Please be with patient at all times.                           [facility] Adult PT Treatment/Exercise - 12/06/20 0001       Modalities   Modalities Vasopneumatic      Vasopneumatic   Number Minutes Vasopneumatic  10 minutes    Vasopnuematic Location  Shoulder    Vasopneumatic Pressure Low    Vasopneumatic Temperature  34/pain      Manual Therapy   Manual Therapy Passive ROM    Passive ROM PROM of R shoulder into flex, ER, IR with gentle holds at end range; oscillations provided to reduce guarding                         PT Long Term Goals - 11/30/20 1353       PT LONG TERM GOAL #1   Title Independent with a HEP.    Time 6    Period Weeks    Status New      PT LONG TERM GOAL #2    Title Active right shoulder flexion to 145 degrees so the patient can easily reach overhead.    Time 6    Status New      PT LONG TERM GOAL #3   Title Active ER to 70 degrees+ to allow for easily donning/doffing of apparel.    Time 6    Period Weeks    Status New      PT LONG TERM GOAL #4   Title Increase right shoulder strength to a solid 4 to 4+/5 to increase stability for performance of functional activities.    Time 6    Period Weeks    Status New      PT LONG TERM GOAL #5   Title Perform ADL's with pain not > 3/10.    Time 6    Period Weeks    Status New                   Plan - 12/06/20 1332     Clinical Impression Statement Patient presented in clinic with reports of improved mobility but soreness of R shoulder. Less ecchymosis notable in RUE and patient notes that the most soreness is primarily in mid R humeral region. Greater soreness reported with end range PROM flexion and ER. Firm end feels and smooth arc of motion noted during PROM. Normal vasopneumatic response noted following removal of the modality.    Personal Factors and Comorbidities Comorbidity 1;Other    Comorbidities Arthrits, h/o piriformis syndrome and sciatica, left knee surgery, lumbar surgery,    Examination-Activity Limitations Other;Reach Overhead    Examination-Participation Restrictions Other    Stability/Clinical Decision Making Stable/Uncomplicated    Rehab Potential Good    PT Frequency 2x / week    PT Duration 6 weeks    PT Treatment/Interventions ADLs/Self Care Home Management;Cryotherapy;Electrical Stimulation;Ultrasound;Moist Heat;Iontophoresis 4mg /ml Dexamethasone;Therapeutic activities;Therapeutic exercise;Manual techniques;Patient/family education;Passive range of motion;Vasopneumatic Device    PT Next Visit Plan Begin with right shoulder p, PAROM, progress to AAROM, modalties to decrease pain.    Consulted and Agree with Plan of Care Patient             Patient will  benefit  from skilled therapeutic intervention in order to improve the following deficits and impairments:  Pain, Decreased activity tolerance, Decreased strength, Decreased range of motion  Visit Diagnosis: Acute pain of right shoulder  Stiffness of right shoulder, not elsewhere classified     Problem List Patient Active Problem List   Diagnosis Date Noted   Fall 09/27/2019   Hypertension    Benign prostatic hyperplasia    Chronic back pain    Fall at home, initial encounter    Temporal bone fracture (HCC)    SDH (subdural hematoma) (HCC)    Marijuana abuse    Alcohol abuse    MDD (major depressive disorder) 09/18/2013   Opiate dependence (HCC) 09/18/2013    Marvell Fuller, PTA 12/06/2020, 1:38 PM  [facility] [address] [place], [place], [address] Phone: [phone]   Fax:  [phone]  Name: Ewing Fandino MRN: [id number] Date of Birth: 10-May-1941

## 2020-12-09 ENCOUNTER — Ambulatory Visit: Payer: Medicare Other | Admitting: Physical Therapy

## 2020-12-09 ENCOUNTER — Other Ambulatory Visit: Payer: Self-pay

## 2020-12-09 ENCOUNTER — Encounter: Payer: Self-pay | Admitting: Physical Therapy

## 2020-12-09 DIAGNOSIS — M25611 Stiffness of right shoulder, not elsewhere classified: Secondary | ICD-10-CM

## 2020-12-09 DIAGNOSIS — M25511 Pain in right shoulder: Secondary | ICD-10-CM | POA: Diagnosis not present

## 2020-12-09 NOTE — Therapy (Signed)
[facility] [address] [place], [place], [address] Phone: [phone]   Fax:  [phone]  Physical Therapy Treatment  Patient Details  Name: David Carroll MRN: [id number] Date of Birth: 10/08/1940 Referring Provider (PT): Elinor Dodge MD   Encounter Date: 12/09/2020   PT End of Session - 12/09/20 1305     Visit Number 4    Number of Visits 12    Date for PT Re-Evaluation 02/16/21    Authorization Type FOTO AT LEAST EVERY 5TH VISIT.  PROGRESS NOTE AT 10TH VISIT.  KX MODIFIER AFTER 15 VISITS.    PT Start Time 1302    PT Stop Time 1348    PT Time Calculation (min) 46 min    Activity Tolerance Patient tolerated treatment well    Behavior During Therapy WFL for tasks assessed/performed             Past Medical History:  Diagnosis Date   Arthritis    KNEES   BPH (benign prostatic hypertrophy)    Complication of anesthesia SLOW RECOVERING FROM ANES. DRUGS   HX OPIATE ADDICTION-- PT ON SUBOXONE   Depression    Glaucoma    History of gastric ulcer    History of gastroesophageal reflux (GERD)    History of kidney stones    Hydronephrosis, left    Hypertension    Nephrolithiasis    Opiate addiction (HCC)    Urgency of urination     Past Surgical History:  Procedure Laterality Date   CATARACT EXTRACTION W/ INTRAOCULAR LENS  IMPLANT, BILATERAL     CYSTOSCOPY WITH BIOPSY Left 11/06/2012   Procedure: CYSTOSCOPY WITH BIOPSY;  Surgeon: Sebastian Ache, MD;  Location: [facility];  Service: Urology;  Laterality: Left;   EXTRACORPOREAL SHOCK WAVE LITHOTRIPSY Left 03-06-2011   HOLMIUM LASER APPLICATION Left 11/06/2012   Procedure: HOLMIUM LASER APPLICATION;  Surgeon: Sebastian Ache, MD;  Location: [facility];  Service: Urology;  Laterality: Left;   INGUINAL HERNIA REPAIR Bilateral 1970   RE-DO LEFT 1985   LUMBAR DISC SURGERY  09-24-2008   L3 -- L4   LUMBAR LAMINECTOMY  05/15/2019   with  partial facet joint removal   MEDIAL COMPARTMENT ARTHROPLASTY, LEFT KNEE  02-22-2010   REMOVAL BENIGN MASS LEFT LUNG  1977   VAGOTOMY  1986   ULCERS    There were no vitals filed for this visit.   Subjective Assessment - 12/09/20 1304     Subjective COVID-19 screen performed prior to patient entering clinic. Reports that his shoulder hurts if moved in a certain direction but not hurting constantly.    Pertinent History Arthrits, h/o piriformis syndrome and sciatica, left knee surgery, lumbar surgery,    How long can you walk comfortably? Short community distances.    Patient Stated Goals Use right arm again without pain.    Currently in Pain? No/denies                [facility] PT Assessment - 12/09/20 0001       Assessment   Medical Diagnosis Dislocation of right shoulder.    Referring Provider (PT) Elinor Dodge MD    Onset Date/Surgical Date 11/21/20    Next MD Visit 12/2020      Precautions   Precautions Fall    Precaution Comments Please be with patient at all times.      Restrictions   Weight Bearing Restrictions No  [facility] Adult PT Treatment/Exercise - 12/09/20 0001       Exercises   Exercises Shoulder      Shoulder Exercises: Supine   Protraction AAROM;Both;20 reps    External Rotation AAROM;Right;20 reps    Flexion AAROM;Both;20 reps      Shoulder Exercises: Standing   Other Standing Exercises Wall ladder RUE x6 reps (max at 25)      Shoulder Exercises: Pulleys   Flexion 3 minutes      Shoulder Exercises: ROM/Strengthening   Ranger into standing; in flexion x15 reps, x10 reps CW circles      Shoulder Exercises: Isometric Strengthening   Flexion 5X5"    External Rotation 5X5"    Internal Rotation 5X5"    Internal Rotation Limitations more pain    ABduction 5X5"    ABduction Limitations mod pain      Modalities   Modalities Vasopneumatic      Vasopneumatic   Number Minutes Vasopneumatic  10  minutes    Vasopnuematic Location  Shoulder    Vasopneumatic Pressure Low    Vasopneumatic Temperature  34/pain      Manual Therapy   Manual Therapy Passive ROM    Passive ROM PROM of R shoulder into flex, ER, IR with gentle holds at end range; oscillations provided to reduce guarding                    PT Education - 12/09/20 1415     Education Details [id number]    Person(s) Educated Patient    Methods Explanation;Demonstration;Handout    Comprehension Verbalized understanding                 PT Long Term Goals - 11/30/20 1353       PT LONG TERM GOAL #1   Title Independent with a HEP.    Time 6    Period Weeks    Status New      PT LONG TERM GOAL #2   Title Active right shoulder flexion to 145 degrees so the patient can easily reach overhead.    Time 6    Status New      PT LONG TERM GOAL #3   Title Active ER to 70 degrees+ to allow for easily donning/doffing of apparel.    Time 6    Period Weeks    Status New      PT LONG TERM GOAL #4   Title Increase right shoulder strength to a solid 4 to 4+/5 to increase stability for performance of functional activities.    Time 6    Period Weeks    Status New      PT LONG TERM GOAL #5   Title Perform ADL's with pain not > 3/10.    Time 6    Period Weeks    Status New                   Plan - 12/09/20 1417     Clinical Impression Statement Patient presented in clinic today with less shoulder pain overall. Patient progressed to [facility] and isometrics with more pain reported with end range flexion as well as isometric abduction but especially isometric IR. Patient required VCs for slow and controlled pace to avoid pain. Patient also requried VCs for UE relaxation during PROM. Firm end feels and smooth arc of motion noted during PROM of R shoulder. Normal vasopneumatic response noted following removal of the modality. Patient provided new AAROM and isometric HEP.  Patient understanding of all  instruction regarding parameters and technique.    Personal Factors and Comorbidities Comorbidity 1;Other    Comorbidities Arthrits, h/o piriformis syndrome and sciatica, left knee surgery, lumbar surgery,    Examination-Activity Limitations Other;Reach Overhead    Examination-Participation Restrictions Other    Stability/Clinical Decision Making Stable/Uncomplicated    Rehab Potential Good    PT Frequency 2x / week    PT Duration 6 weeks    PT Treatment/Interventions ADLs/Self Care Home Management;Cryotherapy;Electrical Stimulation;Ultrasound;Moist Heat;Iontophoresis 4mg /ml Dexamethasone;Therapeutic activities;Therapeutic exercise;Manual techniques;Patient/family education;Passive range of motion;Vasopneumatic Device    PT Next Visit Plan Begin with right shoulder p, PAROM, progress to AAROM, modalties to decrease pain.    Consulted and Agree with Plan of Care Patient             Patient will benefit from skilled therapeutic intervention in order to improve the following deficits and impairments:  Pain, Decreased activity tolerance, Decreased strength, Decreased range of motion  Visit Diagnosis: Acute pain of right shoulder  Stiffness of right shoulder, not elsewhere classified     Problem List Patient Active Problem List   Diagnosis Date Noted   Fall 09/27/2019   Hypertension    Benign prostatic hyperplasia    Chronic back pain    Fall at home, initial encounter    Temporal bone fracture (HCC)    SDH (subdural hematoma) (HCC)    Marijuana abuse    Alcohol abuse    MDD (major depressive disorder) 09/18/2013   Opiate dependence (HCC) 09/18/2013    11/18/2013, PTA 12/09/2020, 2:23 PM  [facility] [address] [place], [place], [place] Phone: [phone]   Fax:  [phone]  Name: David Carroll MRN: Geronimo Boot Date of Birth: 17-Nov-1940

## 2021-01-20 ENCOUNTER — Other Ambulatory Visit: Payer: Self-pay

## 2021-01-20 ENCOUNTER — Ambulatory Visit: Payer: Medicare Other | Attending: Sports Medicine

## 2021-01-20 DIAGNOSIS — M25511 Pain in right shoulder: Secondary | ICD-10-CM | POA: Diagnosis present

## 2021-01-20 DIAGNOSIS — R296 Repeated falls: Secondary | ICD-10-CM | POA: Diagnosis present

## 2021-01-20 DIAGNOSIS — R293 Abnormal posture: Secondary | ICD-10-CM | POA: Diagnosis present

## 2021-01-20 DIAGNOSIS — G8929 Other chronic pain: Secondary | ICD-10-CM | POA: Diagnosis present

## 2021-01-20 DIAGNOSIS — M25611 Stiffness of right shoulder, not elsewhere classified: Secondary | ICD-10-CM | POA: Diagnosis present

## 2021-01-21 NOTE — Addendum Note (Signed)
Addended by: Levonne Spiller on: 01/21/2021 11:58 AM   Modules accepted: Orders
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
## 2021-01-25 ENCOUNTER — Ambulatory Visit: Payer: Medicare Other | Admitting: Physical Therapy

## 2021-01-27 ENCOUNTER — Encounter: Payer: Self-pay | Admitting: Physical Therapy

## 2021-01-27 ENCOUNTER — Other Ambulatory Visit: Payer: Self-pay

## 2021-01-27 ENCOUNTER — Ambulatory Visit: Payer: Medicare Other | Admitting: Physical Therapy

## 2021-01-27 DIAGNOSIS — M25511 Pain in right shoulder: Secondary | ICD-10-CM

## 2021-01-27 DIAGNOSIS — M25611 Stiffness of right shoulder, not elsewhere classified: Secondary | ICD-10-CM

## 2021-01-27 NOTE — Therapy (Signed)
[facility] [address] [place], [place], [address] Phone: [phone]   Fax:  [phone]  Physical Therapy Treatment  Patient Details  Name: David Carroll MRN: [id number] Date of Birth: [birth year] Referring Provider (PT): Elinor Dodge MD   Encounter Date: 01/27/2021   PT End of Session - 01/27/21 1127     Visit Number 2    Number of Visits 12    Date for PT Re-Evaluation 03/03/21    Authorization Type FOTO AT LEAST EVERY 5TH VISIT.  PROGRESS NOTE AT 10TH VISIT.  KX MODIFIER AFTER 15 VISITS.    PT Start Time 1127    PT Stop Time 1201    PT Time Calculation (min) 34 min    Activity Tolerance Patient tolerated treatment well    Behavior During Therapy WFL for tasks assessed/performed             Past Medical History:  Diagnosis Date   Arthritis    KNEES   BPH (benign prostatic hypertrophy)    Complication of anesthesia SLOW RECOVERING FROM ANES. DRUGS   HX OPIATE ADDICTION-- PT ON SUBOXONE   Depression    Glaucoma    History of gastric ulcer    History of gastroesophageal reflux (GERD)    History of kidney stones    Hydronephrosis, left    Hypertension    Nephrolithiasis    Opiate addiction (HCC)    Urgency of urination     Past Surgical History:  Procedure Laterality Date   CATARACT EXTRACTION W/ INTRAOCULAR LENS  IMPLANT, BILATERAL     CYSTOSCOPY WITH BIOPSY Left 11/06/2012   Procedure: CYSTOSCOPY WITH BIOPSY;  Surgeon: Sebastian Ache, MD;  Location: [facility];  Service: Urology;  Laterality: Left;   EXTRACORPOREAL SHOCK WAVE LITHOTRIPSY Left 03-06-2011   HOLMIUM LASER APPLICATION Left 11/06/2012   Procedure: HOLMIUM LASER APPLICATION;  Surgeon: Sebastian Ache, MD;  Location: [facility];  Service: Urology;  Laterality: Left;   INGUINAL HERNIA REPAIR Bilateral 1970   RE-DO LEFT 1985   LUMBAR DISC SURGERY  09-24-2008   L3 -- L4   LUMBAR LAMINECTOMY  05/15/2019   with  partial facet joint removal   MEDIAL COMPARTMENT ARTHROPLASTY, LEFT KNEE  02-22-2010   REMOVAL BENIGN MASS LEFT LUNG  1977   VAGOTOMY  1986   ULCERS    There were no vitals filed for this visit.   Subjective Assessment - 01/27/21 1126     Subjective Reports that pain is mostly at night.    Pertinent History Arthrits, h/o piriformis syndrome and sciatica, left knee surgery, lumbar surgery,    How long can you walk comfortably? Short community distances.    Patient Stated Goals Use right arm again without pain.    Currently in Pain? Yes    Pain Location Shoulder    Pain Orientation Right    Pain Descriptors / Indicators Discomfort    Pain Type Acute pain    Pain Onset 1 to 4 weeks ago    Pain Frequency Intermittent                [facility] PT Assessment - 01/27/21 0001       Assessment   Medical Diagnosis R shoulder rotator cuff tear, avulsion fracture    Referring Provider (PT) Elinor Dodge MD    Onset Date/Surgical Date 01/03/21    Next MD Visit 02/2021    Prior Therapy Yes      Precautions   Precautions Fall  Precaution Comments High risk for falls - repeated falls                           [facility] Adult PT Treatment/Exercise - 01/27/21 0001       Modalities   Modalities Vasopneumatic      Vasopneumatic   Number Minutes Vasopneumatic  10 minutes    Vasopnuematic Location  Shoulder    Vasopneumatic Pressure Low    Vasopneumatic Temperature  34/pain      Manual Therapy   Manual Therapy Passive ROM    Passive ROM PROM of R shoulder into flex, ER with gentle holds at end range; oscillations provided to reduce guarding                      PT Short Term Goals - 01/20/21 1227       PT SHORT TERM GOAL #1   Title Independent with an initial HEP    Time 4    Period Weeks    Status New    Target Date 02/17/21               PT Long Term Goals - 01/20/21 1227       PT LONG TERM GOAL #1   Title Independent with  an advanced HEP.    Time 6    Status New    Target Date 03/03/21      PT LONG TERM GOAL #2   Title Active right shoulder flexion to functional 145 degrees to improve overhead reaching.    Time 6    Period Weeks    Status New    Target Date 03/03/21      PT LONG TERM GOAL #3   Title Active ER to 70 degrees+ to allow for easily donning/doffing of apparel.    Time 6    Period Weeks    Status New      PT LONG TERM GOAL #4   Title Increase right shoulder strength to a solid 4 to 4+/5 to increase stability for performance of functional and recreational activities.    Time 6    Period Weeks    Status New    Target Date 03/03/21      PT LONG TERM GOAL #5   Title Perform ADL's with pain not > 3/10.    Time 6    Period Weeks    Target Date 03/03/21                   Plan - 01/27/21 1200     Clinical Impression Statement Patient presented in clinic with increased pain during movement. Patient unable to fully relax in supine due to shoulder resting in an angle of extension. Patient states that he does sometimes sleep in his recliner as its not much pressure on RUE. Intermittant popping and facial grimacing noted by patient during PROM into flexion. Firm end feels and smooth arc of motion noted during PROM of R shoulder. Normal vasopneumatic response noted following removal of the modality.    Personal Factors and Comorbidities Comorbidity 1;Other    Comorbidities Arthrits, h/o piriformis syndrome and sciatica, left knee surgery, lumbar surgery,    Examination-Activity Limitations Other;Reach Overhead    Examination-Participation Restrictions Other    Stability/Clinical Decision Making Evolving/Moderate complexity    Rehab Potential Good    PT Frequency 2x / week    PT Duration 6 weeks    PT  Treatment/Interventions ADLs/Self Care Home Management;Cryotherapy;Electrical Stimulation;Ultrasound;Moist Heat;Iontophoresis 4mg /ml Dexamethasone;Therapeutic activities;Therapeutic  exercise;Manual techniques;Patient/family education;Passive range of motion;Vasopneumatic Device    PT Next Visit Plan PLAN TO ASSESS LE STRENGTH AND BALANCE TO ADDRESS REPEATED FALLS ; Begin with right shoulder p, PAROM, progress to AAROM, modalties to decrease pain. isometrics  with low grade    Consulted and Agree with Plan of Care Patient             Patient will benefit from skilled therapeutic intervention in order to improve the following deficits and impairments:  Pain, Decreased activity tolerance, Decreased strength, Decreased range of motion  Visit Diagnosis: Acute pain of right shoulder  Stiffness of right shoulder, not elsewhere classified     Problem List Patient Active Problem List   Diagnosis Date Noted   Fall 09/27/2019   Hypertension    Benign prostatic hyperplasia    Chronic back pain    Fall at home, initial encounter    Temporal bone fracture (HCC)    SDH (subdural hematoma) (HCC)    Marijuana abuse    Alcohol abuse    MDD (major depressive disorder) 09/18/2013   Opiate dependence (HCC) 09/18/2013    11/18/2013, PTA 01/27/2021, 12:20 PM  [facility] [address] [place], [place], [place] Phone: [phone]   Fax:  [phone]  Name: Chas Axel MRN: Geronimo Boot Date of Birth: [birth year]

## 2021-02-01 ENCOUNTER — Other Ambulatory Visit: Payer: Self-pay

## 2021-02-01 ENCOUNTER — Ambulatory Visit: Payer: Medicare Other

## 2021-02-01 DIAGNOSIS — M25611 Stiffness of right shoulder, not elsewhere classified: Secondary | ICD-10-CM

## 2021-02-01 DIAGNOSIS — R296 Repeated falls: Secondary | ICD-10-CM

## 2021-02-01 DIAGNOSIS — R293 Abnormal posture: Secondary | ICD-10-CM

## 2021-02-01 DIAGNOSIS — M25511 Pain in right shoulder: Secondary | ICD-10-CM | POA: Diagnosis not present

## 2021-02-01 DIAGNOSIS — G8929 Other chronic pain: Secondary | ICD-10-CM

## 2021-02-01 NOTE — Therapy (Signed)
[facility] [address] [place], [place], [address] Phone: [phone]   Fax:  [phone]  Physical Therapy Treatment  Patient Details  Name: David Carroll MRN: [id number] Date of Birth: [birth year] Referring Provider (PT): Elinor Dodge MD   Encounter Date: 02/01/2021   PT End of Session - 02/01/21 1120     Visit Number 3    Number of Visits 12    Date for PT Re-Evaluation 03/03/21    Authorization Type FOTO AT LEAST EVERY 5TH VISIT.  PROGRESS NOTE AT 10TH VISIT.  KX MODIFIER AFTER 15 VISITS.    PT Start Time 1045    PT Stop Time 1122   patient arrives 15 minutes after scheduled appt   PT Time Calculation (min) 37 min    Activity Tolerance Patient tolerated treatment well    Behavior During Therapy WFL for tasks assessed/performed             Past Medical History:  Diagnosis Date   Arthritis    KNEES   BPH (benign prostatic hypertrophy)    Complication of anesthesia SLOW RECOVERING FROM ANES. DRUGS   HX OPIATE ADDICTION-- PT ON SUBOXONE   Depression    Glaucoma    History of gastric ulcer    History of gastroesophageal reflux (GERD)    History of kidney stones    Hydronephrosis, left    Hypertension    Nephrolithiasis    Opiate addiction (HCC)    Urgency of urination     Past Surgical History:  Procedure Laterality Date   CATARACT EXTRACTION W/ INTRAOCULAR LENS  IMPLANT, BILATERAL     CYSTOSCOPY WITH BIOPSY Left 11/06/2012   Procedure: CYSTOSCOPY WITH BIOPSY;  Surgeon: Sebastian Ache, MD;  Location: [facility];  Service: Urology;  Laterality: Left;   EXTRACORPOREAL SHOCK WAVE LITHOTRIPSY Left 03-06-2011   HOLMIUM LASER APPLICATION Left 11/06/2012   Procedure: HOLMIUM LASER APPLICATION;  Surgeon: Sebastian Ache, MD;  Location: [facility];  Service: Urology;  Laterality: Left;   INGUINAL HERNIA REPAIR Bilateral 1970   RE-DO LEFT 1985   LUMBAR DISC SURGERY  09-24-2008   L3 --  L4   LUMBAR LAMINECTOMY  05/15/2019   with partial facet joint removal   MEDIAL COMPARTMENT ARTHROPLASTY, LEFT KNEE  02-22-2010   REMOVAL BENIGN MASS LEFT LUNG  1977   VAGOTOMY  1986   ULCERS    There were no vitals filed for this visit.   Subjective Assessment - 02/01/21 1111     Subjective COVID-19 screen performed prior to patient entering clinic. Patient states that he has been feleing pretty okay but he has a hard time laying down because of the pain- he feels better when he sits in the recliner at home to sleep.    Pertinent History Arthrits, h/o piriformis syndrome and sciatica, left knee surgery, lumbar surgery,    How long can you walk comfortably? Short community distances.    Patient Stated Goals Use right arm again without pain.    Currently in Pain? Yes    Pain Score 5     Pain Location Shoulder    Pain Orientation Right    Pain Descriptors / Indicators Aching;Discomfort    Pain Type Acute pain    Pain Onset More than a month ago                [facility] PT Assessment - 02/01/21 0001       Assessment   Medical Diagnosis R shoulder  rotator cuff tear, avulsion fracture    Referring Provider (PT) Elinor Dodge MD    Next MD Visit 02/2021      Precautions   Precautions Fall    Precaution Comments High risk for falls - repeated falls                           [facility] Adult PT Treatment/Exercise - 02/01/21 0001       Posture/Postural Control   Posture/Postural Control Postural limitations    Postural Limitations Rounded Shoulders;Forward head      Exercises   Exercises Shoulder      Shoulder Exercises: Supine   Protraction AROM;Strengthening   writing alphabet in the air with 1# weight VCs to reduce level of excursion for alphabet size (small GHJ movement to protect labrum)     Shoulder Exercises: Sidelying   External Rotation Strengthening;12 reps    External Rotation Weight (lbs) 3# with assist from therapist      Modalities    Modalities Vasopneumatic      Vasopneumatic   Number Minutes Vasopneumatic  15 minutes    Vasopnuematic Location  Shoulder    Vasopneumatic Pressure Medium    Vasopneumatic Temperature  34/pain      Manual Therapy   Manual Therapy Passive ROM;Soft tissue mobilization;Joint mobilization    Joint Mobilization AP mob gr III, inferior distraction of R GHJ, ER PROM GHJ    Soft tissue mobilization STM to periscapular ROM    Passive ROM PROM flexion, abd ( to 90). and ER                      PT Short Term Goals - 01/20/21 1227       PT SHORT TERM GOAL #1   Title Independent with an initial HEP    Time 4    Period Weeks    Status New    Target Date 02/17/21               PT Long Term Goals - 01/20/21 1227       PT LONG TERM GOAL #1   Title Independent with an advanced HEP.    Time 6    Status New    Target Date 03/03/21      PT LONG TERM GOAL #2   Title Active right shoulder flexion to functional 145 degrees to improve overhead reaching.    Time 6    Period Weeks    Status New    Target Date 03/03/21      PT LONG TERM GOAL #3   Title Active ER to 70 degrees+ to allow for easily donning/doffing of apparel.    Time 6    Period Weeks    Status New      PT LONG TERM GOAL #4   Title Increase right shoulder strength to a solid 4 to 4+/5 to increase stability for performance of functional and recreational activities.    Time 6    Period Weeks    Status New    Target Date 03/03/21      PT LONG TERM GOAL #5   Title Perform ADL's with pain not > 3/10.    Time 6    Period Weeks    Target Date 03/03/21                   Plan - 02/01/21 1122     Clinical Impression Statement  Patient with fair tolerance to therapy this session reporting mild pain with OH reaching in supine but able to progress with several stability exercises following PROM and inferior distraction. Patient demonstrates fair ROM however limited with scapular winging and poor  quality of movement. Skilled PT recommended to continue addressing deficits.    Personal Factors and Comorbidities Comorbidity 1;Other    Comorbidities Arthrits, h/o piriformis syndrome and sciatica, left knee surgery, lumbar surgery,    Examination-Activity Limitations Other;Reach Overhead    Examination-Participation Restrictions Other    Stability/Clinical Decision Making Evolving/Moderate complexity    Clinical Decision Making Moderate    Rehab Potential Good    PT Frequency 2x / week    PT Duration 6 weeks    PT Treatment/Interventions ADLs/Self Care Home Management;Cryotherapy;Electrical Stimulation;Ultrasound;Moist Heat;Iontophoresis 4mg /ml Dexamethasone;Therapeutic activities;Therapeutic exercise;Manual techniques;Patient/family education;Passive range of motion;Vasopneumatic Device    PT Next Visit Plan Begin with right shoulder, PAROM, progress to AAROM, modalties to decrease pain. isometrics  with low grade    Consulted and Agree with Plan of Care Patient             Patient will benefit from skilled therapeutic intervention in order to improve the following deficits and impairments:  Pain, Decreased activity tolerance, Decreased strength, Decreased range of motion  Visit Diagnosis: Acute pain of right shoulder  Stiffness of right shoulder, not elsewhere classified  Chronic right shoulder pain  Repeated falls  Abnormal posture     Problem List Patient Active Problem List   Diagnosis Date Noted   Fall 09/27/2019   Hypertension    Benign prostatic hyperplasia    Chronic back pain    Fall at home, initial encounter    Temporal bone fracture (HCC)    SDH (subdural hematoma) (HCC)    Marijuana abuse    Alcohol abuse    MDD (major depressive disorder) 09/18/2013   Opiate dependence (HCC) 09/18/2013    11/18/2013 PT, DPT 02/01/2021, 11:27 AM  [facility] [address] [place], [place], [place] Phone:  [phone]   Fax:  [phone]  Name: David Carroll MRN: Geronimo Boot Date of Birth: [birth year]

## 2021-02-03 ENCOUNTER — Other Ambulatory Visit: Payer: Self-pay

## 2021-02-03 ENCOUNTER — Encounter: Payer: Self-pay | Admitting: Physical Therapy

## 2021-02-03 ENCOUNTER — Ambulatory Visit: Payer: Medicare Other | Admitting: Physical Therapy

## 2021-02-03 DIAGNOSIS — M25511 Pain in right shoulder: Secondary | ICD-10-CM | POA: Diagnosis not present

## 2021-02-03 DIAGNOSIS — M25611 Stiffness of right shoulder, not elsewhere classified: Secondary | ICD-10-CM

## 2021-02-03 NOTE — Therapy (Signed)
[facility] [address] [place], [place], [address] Phone: [phone]   Fax:  [phone]  Physical Therapy Treatment  Patient Details  Name: David Carroll MRN: [id number] Date of Birth: 06/02/1941 Referring Provider (PT): Elinor Dodge MD   Encounter Date: 02/03/2021   PT End of Session - 02/03/21 1137     Visit Number 4    Number of Visits 12    Date for PT Re-Evaluation 03/03/21    Authorization Type FOTO AT LEAST EVERY 5TH VISIT.  PROGRESS NOTE AT 10TH VISIT.  KX MODIFIER AFTER 15 VISITS.    PT Start Time 1131    PT Stop Time 1202    PT Time Calculation (min) 31 min    Activity Tolerance Patient tolerated treatment well    Behavior During Therapy WFL for tasks assessed/performed             Past Medical History:  Diagnosis Date   Arthritis    KNEES   BPH (benign prostatic hypertrophy)    Complication of anesthesia SLOW RECOVERING FROM ANES. DRUGS   HX OPIATE ADDICTION-- PT ON SUBOXONE   Depression    Glaucoma    History of gastric ulcer    History of gastroesophageal reflux (GERD)    History of kidney stones    Hydronephrosis, left    Hypertension    Nephrolithiasis    Opiate addiction (HCC)    Urgency of urination     Past Surgical History:  Procedure Laterality Date   CATARACT EXTRACTION W/ INTRAOCULAR LENS  IMPLANT, BILATERAL     CYSTOSCOPY WITH BIOPSY Left 11/06/2012   Procedure: CYSTOSCOPY WITH BIOPSY;  Surgeon: Sebastian Ache, MD;  Location: [facility];  Service: Urology;  Laterality: Left;   EXTRACORPOREAL SHOCK WAVE LITHOTRIPSY Left 03-06-2011   HOLMIUM LASER APPLICATION Left 11/06/2012   Procedure: HOLMIUM LASER APPLICATION;  Surgeon: Sebastian Ache, MD;  Location: [facility];  Service: Urology;  Laterality: Left;   INGUINAL HERNIA REPAIR Bilateral 1970   RE-DO LEFT 1985   LUMBAR DISC SURGERY  09-24-2008   L3 -- L4   LUMBAR LAMINECTOMY  05/15/2019   with  partial facet joint removal   MEDIAL COMPARTMENT ARTHROPLASTY, LEFT KNEE  02-22-2010   REMOVAL BENIGN MASS LEFT LUNG  1977   VAGOTOMY  1986   ULCERS    There were no vitals filed for this visit.   Subjective Assessment - 02/03/21 1136     Subjective COVID-19 screen performed prior to patient entering clinic. Reports discomfort this morning across the top of the shoulder.    Pertinent History Arthrits, h/o piriformis syndrome and sciatica, left knee surgery, lumbar surgery,    How long can you walk comfortably? Short community distances.    Patient Stated Goals Use right arm again without pain.    Currently in Pain? Yes    Pain Score --   No pain score provded   Pain Location Shoulder    Pain Orientation Right    Pain Descriptors / Indicators Discomfort    Pain Type Acute pain    Pain Onset More than a month ago    Pain Frequency Intermittent                [facility] PT Assessment - 02/03/21 0001       Assessment   Medical Diagnosis R shoulder rotator cuff tear, avulsion fracture    Referring Provider (PT) Elinor Dodge MD    Onset Date/Surgical Date 01/03/21  Next MD Visit 02/2021      Precautions   Precautions Fall    Precaution Comments High risk for falls - repeated falls                           [facility] Adult PT Treatment/Exercise - 02/03/21 0001       Shoulder Exercises: Supine   Other Supine Exercises ABCs x1 rep      Shoulder Exercises: Seated   Flexion AROM;Right;20 reps    Flexion Limitations short lever    Abduction AROM;Right;20 reps    ABduction Limitations short lever      Shoulder Exercises: Standing   Other Standing Exercises RUE wall ladder x8 reps      Shoulder Exercises: ROM/Strengthening   Ranger into flex, CW and CCW circles x20 reps      Modalities   Modalities Vasopneumatic      Vasopneumatic   Number Minutes Vasopneumatic  10 minutes    Vasopnuematic Location  Shoulder    Vasopneumatic Pressure Medium     Vasopneumatic Temperature  34/pain      Manual Therapy   Manual Therapy Passive ROM    Passive ROM PROM into flexion, ER, IR with gentle holds at end range                      PT Short Term Goals - 01/20/21 1227       PT SHORT TERM GOAL #1   Title Independent with an initial HEP    Time 4    Period Weeks    Status New    Target Date 02/17/21               PT Long Term Goals - 01/20/21 1227       PT LONG TERM GOAL #1   Title Independent with an advanced HEP.    Time 6    Status New    Target Date 03/03/21      PT LONG TERM GOAL #2   Title Active right shoulder flexion to functional 145 degrees to improve overhead reaching.    Time 6    Period Weeks    Status New    Target Date 03/03/21      PT LONG TERM GOAL #3   Title Active ER to 70 degrees+ to allow for easily donning/doffing of apparel.    Time 6    Period Weeks    Status New      PT LONG TERM GOAL #4   Title Increase right shoulder strength to a solid 4 to 4+/5 to increase stability for performance of functional and recreational activities.    Time 6    Period Weeks    Status New    Target Date 03/03/21      PT LONG TERM GOAL #5   Title Perform ADL's with pain not > 3/10.    Time 6    Period Weeks    Target Date 03/03/21                   Plan - 02/03/21 1159     Clinical Impression Statement Patient presented in clinic with discomfort in R shoulder especially resting with slight extension in supine. Patient progressed to more light AAROM/AROM exercises today. Greater difficulty with short lever abduction and shoulder elevation. Firm end feels and smooth arc of motion noted during PROM of R shoulder with intermittant popping. Normal  vasopneumatic response noted following removal of the modality.    Personal Factors and Comorbidities Comorbidity 1;Other    Comorbidities Arthrits, h/o piriformis syndrome and sciatica, left knee surgery, lumbar surgery,    Examination-Activity  Limitations Other;Reach Overhead    Examination-Participation Restrictions Other    Stability/Clinical Decision Making Evolving/Moderate complexity    Rehab Potential Good    PT Frequency 2x / week    PT Duration 6 weeks    PT Treatment/Interventions ADLs/Self Care Home Management;Cryotherapy;Electrical Stimulation;Ultrasound;Moist Heat;Iontophoresis 4mg /ml Dexamethasone;Therapeutic activities;Therapeutic exercise;Manual techniques;Patient/family education;Passive range of motion;Vasopneumatic Device    PT Next Visit Plan Begin with right shoulder, PAROM, progress to AAROM, modalties to decrease pain. isometrics  with low grade    Consulted and Agree with Plan of Care Patient             Patient will benefit from skilled therapeutic intervention in order to improve the following deficits and impairments:  Pain, Decreased activity tolerance, Decreased strength, Decreased range of motion  Visit Diagnosis: Acute pain of right shoulder  Stiffness of right shoulder, not elsewhere classified     Problem List Patient Active Problem List   Diagnosis Date Noted   Fall 09/27/2019   Hypertension    Benign prostatic hyperplasia    Chronic back pain    Fall at home, initial encounter    Temporal bone fracture (HCC)    SDH (subdural hematoma) (HCC)    Marijuana abuse    Alcohol abuse    MDD (major depressive disorder) 09/18/2013   Opiate dependence (HCC) 09/18/2013    11/18/2013, PTA 02/03/2021, 12:10 PM  [facility] [address] [place], [place], [place] Phone: [phone]   Fax:  [phone]  Name: Rodgers Likes MRN: Geronimo Boot Date of Birth: Oct 11, 1940

## 2021-02-07 ENCOUNTER — Ambulatory Visit: Payer: Medicare Other | Admitting: Physical Therapy

## 2021-02-07 ENCOUNTER — Other Ambulatory Visit: Payer: Self-pay

## 2021-02-07 ENCOUNTER — Encounter: Payer: Self-pay | Admitting: Physical Therapy

## 2021-02-07 DIAGNOSIS — M25511 Pain in right shoulder: Secondary | ICD-10-CM | POA: Diagnosis not present

## 2021-02-07 DIAGNOSIS — M25611 Stiffness of right shoulder, not elsewhere classified: Secondary | ICD-10-CM

## 2021-02-07 NOTE — Therapy (Signed)
[facility] [address] [place], [place], [address] Phone: [phone]   Fax:  [phone]  Physical Therapy Treatment  Patient Details  Name: David Carroll MRN: [id number] Date of Birth: 10/29/1940 Referring Provider (PT): Elinor Dodge MD   Encounter Date: 02/07/2021   PT End of Session - 02/07/21 1200     Visit Number 5    Number of Visits 12    Date for PT Re-Evaluation 03/03/21    Authorization Type FOTO AT LEAST EVERY 5TH VISIT.  PROGRESS NOTE AT 10TH VISIT.  KX MODIFIER AFTER 15 VISITS.    PT Start Time 1120    PT Stop Time 1204    PT Time Calculation (min) 44 min    Activity Tolerance Patient tolerated treatment well    Behavior During Therapy WFL for tasks assessed/performed             Past Medical History:  Diagnosis Date   Arthritis    KNEES   BPH (benign prostatic hypertrophy)    Complication of anesthesia SLOW RECOVERING FROM ANES. DRUGS   HX OPIATE ADDICTION-- PT ON SUBOXONE   Depression    Glaucoma    History of gastric ulcer    History of gastroesophageal reflux (GERD)    History of kidney stones    Hydronephrosis, left    Hypertension    Nephrolithiasis    Opiate addiction (HCC)    Urgency of urination     Past Surgical History:  Procedure Laterality Date   CATARACT EXTRACTION W/ INTRAOCULAR LENS  IMPLANT, BILATERAL     CYSTOSCOPY WITH BIOPSY Left 11/06/2012   Procedure: CYSTOSCOPY WITH BIOPSY;  Surgeon: Sebastian Ache, MD;  [facility];  Service: Urology;  Laterality: Left;   EXTRACORPOREAL SHOCK WAVE LITHOTRIPSY Left 03-06-2011   HOLMIUM LASER APPLICATION Left 11/06/2012   Procedure: HOLMIUM LASER APPLICATION;  Surgeon: Sebastian Ache, MD;  Location: [facility];  Service: Urology;  Laterality: Left;   INGUINAL HERNIA REPAIR Bilateral 1970   RE-DO LEFT 1985   LUMBAR DISC SURGERY  09-24-2008   L3 -- L4   LUMBAR LAMINECTOMY  05/15/2019   with  partial facet joint removal   MEDIAL COMPARTMENT ARTHROPLASTY, LEFT KNEE  02-22-2010   REMOVAL BENIGN MASS LEFT LUNG  1977   VAGOTOMY  1986   ULCERS    There were no vitals filed for this visit.   Subjective Assessment - 02/07/21 1125     Subjective COVID-19 screen performed prior to patient entering clinic. Reports aching of R shoulder. Tried doing gardening work.    Pertinent History Arthrits, h/o piriformis syndrome and sciatica, left knee surgery, lumbar surgery,    How long can you walk comfortably? Short community distances.    Patient Stated Goals Use right arm again without pain.    Currently in Pain? Yes   no other assessment provided regarding score               [facility] PT Assessment - 02/07/21 0001       Assessment   Medical Diagnosis R shoulder rotator cuff tear, avulsion fracture    Referring Provider (PT) Elinor Dodge MD    Onset Date/Surgical Date 01/03/21    Next MD Visit 02/2021    Prior Therapy Yes      Precautions   Precautions Fall    Precaution Comments High risk for falls - repeated falls  [facility] Adult PT Treatment/Exercise - 02/07/21 0001       Shoulder Exercises: Supine   Protraction AROM;Right;5 reps      Shoulder Exercises: ROM/Strengthening   Wall Wash on countertop in flexion, CW and CCW circles x20 reps      Shoulder Exercises: Isometric Strengthening   Flexion 5X5"    External Rotation 5X5"    Internal Rotation 5X5"      Modalities   Modalities Electrical Stimulation;Moist Heat      Moist Heat Therapy   Number Minutes Moist Heat 15 Minutes    Moist Heat Location Shoulder      Electrical Stimulation   Electrical Stimulation Location R shoulder    Electrical Stimulation Action Pre-Mod    Electrical Stimulation Parameters 80-150 hz x15 min    Electrical Stimulation Goals Pain      Manual Therapy   Manual Therapy Passive ROM    Passive ROM PROM into flexion, ER with gentle  holds at end range                      PT Short Term Goals - 01/20/21 1227       PT SHORT TERM GOAL #1   Title Independent with an initial HEP    Time 4    Period Weeks    Status New    Target Date 02/17/21               PT Long Term Goals - 01/20/21 1227       PT LONG TERM GOAL #1   Title Independent with an advanced HEP.    Time 6    Status New    Target Date 03/03/21      PT LONG TERM GOAL #2   Title Active right shoulder flexion to functional 145 degrees to improve overhead reaching.    Time 6    Period Weeks    Status New    Target Date 03/03/21      PT LONG TERM GOAL #3   Title Active ER to 70 degrees+ to allow for easily donning/doffing of apparel.    Time 6    Period Weeks    Status New      PT LONG TERM GOAL #4   Title Increase right shoulder strength to a solid 4 to 4+/5 to increase stability for performance of functional and recreational activities.    Time 6    Period Weeks    Status New    Target Date 03/03/21      PT LONG TERM GOAL #5   Title Perform ADL's with pain not > 3/10.    Time 6    Period Weeks    Target Date 03/03/21                   Plan - 02/07/21 1212     Clinical Impression Statement Patient presented in clinic with reports of increased R shoulder discomfort. Patient able to tolerate light isometric and ROM at counter height but stopped due to discomfort. Limited with antigravity protraction AROM due to pain. Patient required VCs to relax fully along with oscillations to promote relaxation. Empty end feels due to pain of R shoulder. Normal modalities response noted following removal of the modalities.    Personal Factors and Comorbidities Comorbidity 1;Other    Comorbidities Arthrits, h/o piriformis syndrome and sciatica, left knee surgery, lumbar surgery,    Examination-Activity Limitations Other;Reach Overhead    Examination-Participation Restrictions  Other    Stability/Clinical Decision Making  Evolving/Moderate complexity    Rehab Potential Good    PT Frequency 2x / week    PT Duration 6 weeks    PT Treatment/Interventions ADLs/Self Care Home Management;Cryotherapy;Electrical Stimulation;Ultrasound;Moist Heat;Iontophoresis 4mg /ml Dexamethasone;Therapeutic activities;Therapeutic exercise;Manual techniques;Patient/family education;Passive range of motion;Vasopneumatic Device    PT Next Visit Plan Begin with right shoulder, PAROM, progress to AAROM, modalties to decrease pain. isometrics  with low grade    Consulted and Agree with Plan of Care Patient             Patient will benefit from skilled therapeutic intervention in order to improve the following deficits and impairments:  Pain, Decreased activity tolerance, Decreased strength, Decreased range of motion  Visit Diagnosis: Acute pain of right shoulder  Stiffness of right shoulder, not elsewhere classified     Problem List Patient Active Problem List   Diagnosis Date Noted   Fall 09/27/2019   Hypertension    Benign prostatic hyperplasia    Chronic back pain    Fall at home, initial encounter    Temporal bone fracture (HCC)    SDH (subdural hematoma) (HCC)    Marijuana abuse    Alcohol abuse    MDD (major depressive disorder) 09/18/2013   Opiate dependence (HCC) 09/18/2013    11/18/2013, PTA 02/07/2021, 12:19 PM  [facility] [address] [place], [place], [place] Phone: [phone]   Fax:  [phone]  Name: David Carroll MRN: Geronimo Boot Date of Birth: 06-16-1940

## 2021-02-10 ENCOUNTER — Ambulatory Visit: Payer: Medicare Other | Attending: Sports Medicine | Admitting: *Deleted

## 2021-02-10 ENCOUNTER — Other Ambulatory Visit: Payer: Self-pay

## 2021-02-10 DIAGNOSIS — M25511 Pain in right shoulder: Secondary | ICD-10-CM | POA: Insufficient documentation

## 2021-02-10 DIAGNOSIS — M25611 Stiffness of right shoulder, not elsewhere classified: Secondary | ICD-10-CM | POA: Diagnosis present

## 2021-02-10 DIAGNOSIS — G8929 Other chronic pain: Secondary | ICD-10-CM | POA: Insufficient documentation

## 2021-02-10 NOTE — Therapy (Signed)
[facility] [address] [place], [place], [address] Phone: [phone]   Fax:  [phone]  Physical Therapy Treatment  Patient Details  Name: David Carroll MRN: [id number] Date of Birth: June 06, 1941 Referring Provider (PT): Elinor Dodge MD   Encounter Date: 02/10/2021   PT End of Session - 02/10/21 1330     Visit Number 6    Number of Visits 12    Date for PT Re-Evaluation 03/03/21    Authorization Type FOTO AT LEAST EVERY 5TH VISIT.  PROGRESS NOTE AT 10TH VISIT.  KX MODIFIER AFTER 15 VISITS.    PT Start Time 1128    PT Stop Time 1213    PT Time Calculation (min) 45 min             Past Medical History:  Diagnosis Date   Arthritis    KNEES   BPH (benign prostatic hypertrophy)    Complication of anesthesia SLOW RECOVERING FROM ANES. DRUGS   HX OPIATE ADDICTION-- PT ON SUBOXONE   Depression    Glaucoma    History of gastric ulcer    History of gastroesophageal reflux (GERD)    History of kidney stones    Hydronephrosis, left    Hypertension    Nephrolithiasis    Opiate addiction (HCC)    Urgency of urination     Past Surgical History:  Procedure Laterality Date   CATARACT EXTRACTION W/ INTRAOCULAR LENS  IMPLANT, BILATERAL     CYSTOSCOPY WITH BIOPSY Left 11/06/2012   Procedure: CYSTOSCOPY WITH BIOPSY;  Surgeon: Sebastian Ache, MD;  Location: [facility];  Service: Urology;  Laterality: Left;   EXTRACORPOREAL SHOCK WAVE LITHOTRIPSY Left 03-06-2011   HOLMIUM LASER APPLICATION Left 11/06/2012   Procedure: HOLMIUM LASER APPLICATION;  Surgeon: Sebastian Ache, MD;  Location: [facility];  Service: Urology;  Laterality: Left;   INGUINAL HERNIA REPAIR Bilateral 1970   RE-DO LEFT 1985   LUMBAR DISC SURGERY  09-24-2008   L3 -- L4   LUMBAR LAMINECTOMY  05/15/2019   with partial facet joint removal   MEDIAL COMPARTMENT ARTHROPLASTY, LEFT KNEE  02-22-2010   REMOVAL BENIGN MASS LEFT LUNG   1977   VAGOTOMY  1986   ULCERS    There were no vitals filed for this visit.   Subjective Assessment - 02/10/21 1124     Subjective COVID-19 screen performed prior to patient entering clinic. Reports aching of R shoulder. pain with use 3-4/10    Pertinent History Arthrits, h/o piriformis syndrome and sciatica, left knee surgery, lumbar surgery,    How long can you walk comfortably? Short community distances.    Patient Stated Goals Use right arm again without pain.    Currently in Pain? Yes    Pain Score 3     Pain Location Shoulder    Pain Orientation Right    Pain Descriptors / Indicators Discomfort    Pain Type Acute pain    Pain Onset More than a month ago                               [facility] Adult PT Treatment/Exercise - 02/10/21 0001       Exercises   Exercises Shoulder      Shoulder Exercises: Pulleys   Flexion 5 minutes    Other Pulley Exercises seated UE ranger flexion/extension, CW/CCW x 5 mins      Shoulder Exercises: Isometric Strengthening  Flexion --    External Rotation --    Internal Rotation --      Modalities   Modalities Electrical Stimulation;Moist Heat      Moist Heat Therapy   Number Minutes Moist Heat 15 Minutes    Moist Heat Location Shoulder      Electrical Stimulation   Electrical Stimulation Location R shoulder    Electrical Stimulation Action premod    Electrical Stimulation Parameters 80-150hz  x 15 mins    Electrical Stimulation Goals Pain      Vasopneumatic   Number Minutes Vasopneumatic  10 minutes    Vasopnuematic Location  Shoulder    Vasopneumatic Pressure Medium    Vasopneumatic Temperature  34/pain      Manual Therapy   Manual Therapy Passive ROM    Passive ROM AAROM with reaching up 3x10, manual resisted motions fo ER and IR 3x10 each                      PT Short Term Goals - 01/20/21 1227       PT SHORT TERM GOAL #1   Title Independent with an initial HEP    Time 4    Period  Weeks    Status New    Target Date 02/17/21               PT Long Term Goals - 01/20/21 1227       PT LONG TERM GOAL #1   Title Independent with an advanced HEP.    Time 6    Status New    Target Date 03/03/21      PT LONG TERM GOAL #2   Title Active right shoulder flexion to functional 145 degrees to improve overhead reaching.    Time 6    Period Weeks    Status New    Target Date 03/03/21      PT LONG TERM GOAL #3   Title Active ER to 70 degrees+ to allow for easily donning/doffing of apparel.    Time 6    Period Weeks    Status New      PT LONG TERM GOAL #4   Title Increase right shoulder strength to a solid 4 to 4+/5 to increase stability for performance of functional and recreational activities.    Time 6    Period Weeks    Status New    Target Date 03/03/21      PT LONG TERM GOAL #5   Title Perform ADL's with pain not > 3/10.    Time 6    Period Weeks    Target Date 03/03/21                   Plan - 02/10/21 1331     Clinical Impression Statement Pt arrived today doing fairly well and was able to raise arm higher today. He was able to perform AAROM exs as well as tolerate AAROM with manual assistane. Normal modality response today    Personal Factors and Comorbidities Comorbidity 1;Other    Comorbidities Arthrits, h/o piriformis syndrome and sciatica, left knee surgery, lumbar surgery,    Examination-Activity Limitations Other;Reach Overhead    Rehab Potential Good    PT Frequency 2x / week    PT Duration 6 weeks    PT Treatment/Interventions ADLs/Self Care Home Management;Cryotherapy;Electrical Stimulation;Ultrasound;Moist Heat;Iontophoresis 4mg /ml Dexamethasone;Therapeutic activities;Therapeutic exercise;Manual techniques;Patient/family education;Passive range of motion;Vasopneumatic Device    PT Next Visit Plan Begin with right shoulder,  PAROM, progress to AAROM, modalties to decrease pain. isometrics  with low grade              Patient will benefit from skilled therapeutic intervention in order to improve the following deficits and impairments:  Pain, Decreased activity tolerance, Decreased strength, Decreased range of motion  Visit Diagnosis: Acute pain of right shoulder  Stiffness of right shoulder, not elsewhere classified  Chronic right shoulder pain     Problem List Patient Active Problem List   Diagnosis Date Noted   Fall 09/27/2019   Hypertension    Benign prostatic hyperplasia    Chronic back pain    Fall at home, initial encounter    Temporal bone fracture (HCC)    SDH (subdural hematoma) (HCC)    Marijuana abuse    Alcohol abuse    MDD (major depressive disorder) 09/18/2013   Opiate dependence (HCC) 09/18/2013    David Carroll,David Carroll, David Carroll 02/10/2021, 1:35 PM  [facility] [address] [place], [place], [address] Phone: [phone]   Fax:  [phone]  Name: David Carroll MRN: [id number] Date of Birth: 11-10-1940

## 2021-02-16 ENCOUNTER — Other Ambulatory Visit: Payer: Self-pay

## 2021-02-16 ENCOUNTER — Ambulatory Visit: Payer: Medicare Other | Admitting: Physical Therapy

## 2021-02-16 ENCOUNTER — Encounter: Payer: Self-pay | Admitting: Physical Therapy

## 2021-02-16 DIAGNOSIS — M25511 Pain in right shoulder: Secondary | ICD-10-CM

## 2021-02-16 DIAGNOSIS — M25611 Stiffness of right shoulder, not elsewhere classified: Secondary | ICD-10-CM

## 2021-02-16 NOTE — Therapy (Signed)
[facility] [address] [place], [place], [address] Phone: [phone]   Fax:  [phone]  Physical Therapy Treatment  Patient Details  Name: David Carroll MRN: [id number] Date of Birth: 1941-05-11 Referring Provider (PT): Elinor Dodge MD   Encounter Date: 02/16/2021   PT End of Session - 02/16/21 1153     Visit Number 7    Number of Visits 12    Date for PT Re-Evaluation 03/03/21    Authorization Type FOTO AT LEAST EVERY 5TH VISIT.  PROGRESS NOTE AT 10TH VISIT.  KX MODIFIER AFTER 15 VISITS.    PT Start Time 1118    PT Stop Time 1200    PT Time Calculation (min) 42 min    Activity Tolerance Patient tolerated treatment well    Behavior During Therapy WFL for tasks assessed/performed             Past Medical History:  Diagnosis Date   Arthritis    KNEES   BPH (benign prostatic hypertrophy)    Complication of anesthesia SLOW RECOVERING FROM ANES. DRUGS   HX OPIATE ADDICTION-- PT ON SUBOXONE   Depression    Glaucoma    History of gastric ulcer    History of gastroesophageal reflux (GERD)    History of kidney stones    Hydronephrosis, left    Hypertension    Nephrolithiasis    Opiate addiction (HCC)    Urgency of urination     Past Surgical History:  Procedure Laterality Date   CATARACT EXTRACTION W/ INTRAOCULAR LENS  IMPLANT, BILATERAL     CYSTOSCOPY WITH BIOPSY Left 11/06/2012   Procedure: CYSTOSCOPY WITH BIOPSY;  Surgeon: Sebastian Ache, MD;  Location: [facility];  Service: Urology;  Laterality: Left;   EXTRACORPOREAL SHOCK WAVE LITHOTRIPSY Left 03-06-2011   HOLMIUM LASER APPLICATION Left 11/06/2012   Procedure: HOLMIUM LASER APPLICATION;  Surgeon: Sebastian Ache, MD;  Location: [facility];  Service: Urology;  Laterality: Left;   INGUINAL HERNIA REPAIR Bilateral 1970   RE-DO LEFT 1985   LUMBAR DISC SURGERY  09-24-2008   L3 -- L4   LUMBAR LAMINECTOMY  05/15/2019   with  partial facet joint removal   MEDIAL COMPARTMENT ARTHROPLASTY, LEFT KNEE  02-22-2010   REMOVAL BENIGN MASS LEFT LUNG  1977   VAGOTOMY  1986   ULCERS    There were no vitals filed for this visit.   Subjective Assessment - 02/16/21 1151     Subjective COVID-19 screen performed prior to patient entering clinic. Reports pain of R shoulder especially posteriosuperior shoulder.    Pertinent History Arthrits, h/o piriformis syndrome and sciatica, left knee surgery, lumbar surgery,    How long can you walk comfortably? Short community distances.    Patient Stated Goals Use right arm again without pain.    Currently in Pain? Yes    Pain Score --   No pain score provided   Pain Location Shoulder    Pain Orientation Right    Pain Descriptors / Indicators Discomfort    Pain Type Acute pain    Pain Onset More than a month ago    Pain Frequency Intermittent                [facility] PT Assessment - 02/16/21 0001       Assessment   Medical Diagnosis R shoulder rotator cuff tear, avulsion fracture    Referring Provider (PT) Elinor Dodge MD    Onset Date/Surgical Date 01/03/21  Next MD Visit 02/2021    Prior Therapy Yes      Precautions   Precautions Fall    Precaution Comments High risk for falls - repeated falls                           [facility] Adult PT Treatment/Exercise - 02/16/21 0001       Shoulder Exercises: Supine   Protraction AROM;Right;20 reps      Shoulder Exercises: Seated   Flexion AROM;Right;15 reps    Abduction AROM;Right;10 reps      Shoulder Exercises: Sidelying   External Rotation AROM;Right;10 reps    Flexion AROM;Right;15 reps      Shoulder Exercises: Pulleys   Flexion 2 minutes    Other Pulley Exercises seated UE ranger flexion/extension, CW/CCW x 5 mins      Shoulder Exercises: ROM/Strengthening   Rhythmic Stabilization, Supine 5x30 sec each; ranging from 45 deg to 90 deg of shoulder flex      Shoulder Exercises: Isometric  Strengthening   Extension 5X10";Limitations    Extension Limitations seated    External Rotation 5X10";Limitations    External Rotation Limitations seated    Internal Rotation 5X10";Limitations    Internal Rotation Limitations seated      Modalities   Modalities Vasopneumatic      Vasopneumatic   Number Minutes Vasopneumatic  10 minutes    Vasopnuematic Location  Shoulder    Vasopneumatic Pressure Medium    Vasopneumatic Temperature  34/pain      Manual Therapy   Manual Therapy Passive ROM    Passive ROM P/AAROM of R shoulder into flex/ ER                  Upper Extremity Functional Index Score :   /80     PT Short Term Goals - 01/20/21 1227       PT SHORT TERM GOAL #1   Title Independent with an initial HEP    Time 4    Period Weeks    Status New    Target Date 02/17/21               PT Long Term Goals - 01/20/21 1227       PT LONG TERM GOAL #1   Title Independent with an advanced HEP.    Time 6    Status New    Target Date 03/03/21      PT LONG TERM GOAL #2   Title Active right shoulder flexion to functional 145 degrees to improve overhead reaching.    Time 6    Period Weeks    Status New    Target Date 03/03/21      PT LONG TERM GOAL #3   Title Active ER to 70 degrees+ to allow for easily donning/doffing of apparel.    Time 6    Period Weeks    Status New      PT LONG TERM GOAL #4   Title Increase right shoulder strength to a solid 4 to 4+/5 to increase stability for performance of functional and recreational activities.    Time 6    Period Weeks    Status New    Target Date 03/03/21      PT LONG TERM GOAL #5   Title Perform ADL's with pain not > 3/10.    Time 6    Period Weeks    Target Date 03/03/21  Plan - 02/16/21 1217     Clinical Impression Statement Patient arrived today with reports of greater R shoulder pain especially posteriorly. More discomfort also reported by patient during extension  based exercises. Light ROM and isometric training completed as well as introduction to rhythmic stabilzations as patient is more concerned with return to functional living and gaining strength. Firm end feels and smooth arc of motion noted during P/AAROM. Normal vasopneumatic response noted following removal of the modality.    Personal Factors and Comorbidities Comorbidity 1;Other    Comorbidities Arthrits, h/o piriformis syndrome and sciatica, left knee surgery, lumbar surgery,    Examination-Activity Limitations Other;Reach Overhead    Examination-Participation Restrictions Other    Stability/Clinical Decision Making Evolving/Moderate complexity    Rehab Potential Good    PT Frequency 2x / week    PT Duration 6 weeks    PT Treatment/Interventions ADLs/Self Care Home Management;Cryotherapy;Electrical Stimulation;Ultrasound;Moist Heat;Iontophoresis 4mg /ml Dexamethasone;Therapeutic activities;Therapeutic exercise;Manual techniques;Patient/family education;Passive range of motion;Vasopneumatic Device    PT Next Visit Plan Begin with right shoulder, PAROM, progress to AAROM, modalties to decrease pain. isometrics  with low grade    Consulted and Agree with Plan of Care Patient             Patient will benefit from skilled therapeutic intervention in order to improve the following deficits and impairments:  Pain, Decreased activity tolerance, Decreased strength, Decreased range of motion  Visit Diagnosis: Acute pain of right shoulder  Stiffness of right shoulder, not elsewhere classified     Problem List Patient Active Problem List   Diagnosis Date Noted   Fall 09/27/2019   Hypertension    Benign prostatic hyperplasia    Chronic back pain    Fall at home, initial encounter    Temporal bone fracture (HCC)    SDH (subdural hematoma) (HCC)    Marijuana abuse    Alcohol abuse    MDD (major depressive disorder) 09/18/2013   Opiate dependence (HCC) 09/18/2013   11/18/2013,  PTA 02/16/21 12:21 PM   [facility] [address] [place], [place], [place] Phone: [phone]   Fax:  [phone]  Name: Demitrius Crass MRN: Geronimo Boot Date of Birth: 1941-06-06

## 2021-02-22 ENCOUNTER — Other Ambulatory Visit: Payer: Self-pay

## 2021-02-22 ENCOUNTER — Encounter: Payer: Self-pay | Admitting: Physical Therapy

## 2021-02-22 ENCOUNTER — Ambulatory Visit: Payer: Medicare Other | Admitting: Physical Therapy

## 2021-02-22 DIAGNOSIS — M25511 Pain in right shoulder: Secondary | ICD-10-CM | POA: Diagnosis not present

## 2021-02-22 DIAGNOSIS — M25611 Stiffness of right shoulder, not elsewhere classified: Secondary | ICD-10-CM

## 2021-02-22 NOTE — Therapy (Signed)
[facility] [address] [place], [place], [address] Phone: [phone]   Fax:  [phone]  Physical Therapy Treatment  Patient Details  Name: David Carroll MRN: [id number] Date of Birth: 1941/01/02 Referring Provider (PT): Elinor Dodge MD   Encounter Date: 02/22/2021   PT End of Session - 02/22/21 1309     Visit Number 8    Number of Visits 12    Date for PT Re-Evaluation 03/03/21    Authorization Type FOTO AT LEAST EVERY 5TH VISIT.  PROGRESS NOTE AT 10TH VISIT.  KX MODIFIER AFTER 15 VISITS.    PT Start Time 1308    PT Stop Time 1348    PT Time Calculation (min) 40 min    Activity Tolerance Patient tolerated treatment well    Behavior During Therapy WFL for tasks assessed/performed             Past Medical History:  Diagnosis Date   Arthritis    KNEES   BPH (benign prostatic hypertrophy)    Complication of anesthesia SLOW RECOVERING FROM ANES. DRUGS   HX OPIATE ADDICTION-- PT ON SUBOXONE   Depression    Glaucoma    History of gastric ulcer    History of gastroesophageal reflux (GERD)    History of kidney stones    Hydronephrosis, left    Hypertension    Nephrolithiasis    Opiate addiction (HCC)    Urgency of urination     Past Surgical History:  Procedure Laterality Date   CATARACT EXTRACTION W/ INTRAOCULAR LENS  IMPLANT, BILATERAL     CYSTOSCOPY WITH BIOPSY Left 11/06/2012   Procedure: CYSTOSCOPY WITH BIOPSY;  Surgeon: Sebastian Ache, MD;  Location: [facility];  Service: Urology;  Laterality: Left;   EXTRACORPOREAL SHOCK WAVE LITHOTRIPSY Left 03-06-2011   HOLMIUM LASER APPLICATION Left 11/06/2012   Procedure: HOLMIUM LASER APPLICATION;  Surgeon: Sebastian Ache, MD;  Location: [facility];  Service: Urology;  Laterality: Left;   INGUINAL HERNIA REPAIR Bilateral 1970   RE-DO LEFT 1985   LUMBAR DISC SURGERY  09-24-2008   L3 -- L4   LUMBAR LAMINECTOMY  05/15/2019   with  partial facet joint removal   MEDIAL COMPARTMENT ARTHROPLASTY, LEFT KNEE  02-22-2010   REMOVAL BENIGN MASS LEFT LUNG  1977   VAGOTOMY  1986   ULCERS    There were no vitals filed for this visit.   Subjective Assessment - 02/22/21 1308     Subjective COVID-19 screen performed prior to patient entering clinic. No pain today and acknowledges that his shoulder is slowly improving.    Pertinent History Arthrits, h/o piriformis syndrome and sciatica, left knee surgery, lumbar surgery,    How long can you walk comfortably? Short community distances.    Patient Stated Goals Use right arm again without pain.    Currently in Pain? No/denies                [facility] PT Assessment - 02/22/21 0001       Assessment   Medical Diagnosis R shoulder rotator cuff tear, avulsion fracture    Referring Provider (PT) Elinor Dodge MD    Onset Date/Surgical Date 01/03/21    Next MD Visit 02/2021    Prior Therapy Yes      Precautions   Precautions Fall    Precaution Comments High risk for falls - repeated falls  [facility] Adult PT Treatment/Exercise - 02/22/21 0001       Shoulder Exercises: Supine   Protraction AROM;Right;15 reps    Flexion AROM;Right;20 reps      Shoulder Exercises: Seated   Protraction AROM;Right;15 reps    Flexion AROM;Right;20 reps    Abduction AROM;Right;15 reps      Shoulder Exercises: Pulleys   Flexion 3 minutes      Shoulder Exercises: ROM/Strengthening   Wall [place] into flex x15 reps, CCW circles x15 reps   fatigued   Rhythmic Stabilization, Supine 5x30 sec each; ranging from 45 deg to 90 deg of shoulder flex and ER      Shoulder Exercises: Isometric Strengthening   Flexion 5X5"    Extension 5X5"    External Rotation 5X5"    Internal Rotation 5X5"      Modalities   Modalities Vasopneumatic      Vasopneumatic   Number Minutes Vasopneumatic  10 minutes    Vasopnuematic Location  Shoulder    Vasopneumatic  Pressure Low    Vasopneumatic Temperature  34/pain      Manual Therapy   Manual Therapy Passive ROM    Passive ROM P/AAROM of R shoulder into flex/ ER                       PT Short Term Goals - 01/20/21 1227       PT SHORT TERM GOAL #1   Title Independent with an initial HEP    Time 4    Period Weeks    Status New    Target Date 02/17/21               PT Long Term Goals - 01/20/21 1227       PT LONG TERM GOAL #1   Title Independent with an advanced HEP.    Time 6    Status New    Target Date 03/03/21      PT LONG TERM GOAL #2   Title Active right shoulder flexion to functional 145 degrees to improve overhead reaching.    Time 6    Period Weeks    Status New    Target Date 03/03/21      PT LONG TERM GOAL #3   Title Active ER to 70 degrees+ to allow for easily donning/doffing of apparel.    Time 6    Period Weeks    Status New      PT LONG TERM GOAL #4   Title Increase right shoulder strength to a solid 4 to 4+/5 to increase stability for performance of functional and recreational activities.    Time 6    Period Weeks    Status New    Target Date 03/03/21      PT LONG TERM GOAL #5   Title Perform ADL's with pain not > 3/10.    Time 6    Period Weeks    Target Date 03/03/21                   Plan - 02/22/21 1401     Clinical Impression Statement Patient presented in clinic with more limitations due to muscle fatigue due to more antigravity and strengthening exercises being initiated. Patient more limited with short lever abduction. Fairly good stabilization noted with rhythmic stabilization in ER and flexion. Normal vasopneumatic response noted following removal of the modality.    Personal Factors and Comorbidities Comorbidity 1;Other    Comorbidities Arthrits, h/o  piriformis syndrome and sciatica, left knee surgery, lumbar surgery,    Examination-Activity Limitations Other;Reach Overhead    Examination-Participation  Restrictions Other    Stability/Clinical Decision Making Evolving/Moderate complexity    Rehab Potential Good    PT Frequency 2x / week    PT Duration 6 weeks    PT Treatment/Interventions ADLs/Self Care Home Management;Cryotherapy;Electrical Stimulation;Ultrasound;Moist Heat;Iontophoresis 4mg /ml Dexamethasone;Therapeutic activities;Therapeutic exercise;Manual techniques;Patient/family education;Passive range of motion;Vasopneumatic Device    PT Next Visit Plan Begin with right shoulder, PAROM, progress to AAROM, modalties to decrease pain. isometrics  with low grade    Consulted and Agree with Plan of Care Patient             Patient will benefit from skilled therapeutic intervention in order to improve the following deficits and impairments:  Pain, Decreased activity tolerance, Decreased strength, Decreased range of motion  Visit Diagnosis: Acute pain of right shoulder  Stiffness of right shoulder, not elsewhere classified     Problem List Patient Active Problem List   Diagnosis Date Noted   Fall 09/27/2019   Hypertension    Benign prostatic hyperplasia    Chronic back pain    Fall at home, initial encounter    Temporal bone fracture (HCC)    SDH (subdural hematoma) (HCC)    Marijuana abuse    Alcohol abuse    MDD (major depressive disorder) 09/18/2013   Opiate dependence (HCC) 09/18/2013    11/18/2013, PTA 02/22/2021, 2:21 PM  [facility] [address] [place], [place], [place] Phone: [phone]   Fax:  [phone]  Name: David Carroll MRN: Geronimo Boot Date of Birth: 1941/01/10

## 2021-02-24 ENCOUNTER — Other Ambulatory Visit: Payer: Self-pay

## 2021-02-24 ENCOUNTER — Encounter: Payer: Self-pay | Admitting: *Deleted

## 2021-02-24 ENCOUNTER — Ambulatory Visit: Payer: Medicare Other | Admitting: *Deleted

## 2021-02-24 DIAGNOSIS — M25611 Stiffness of right shoulder, not elsewhere classified: Secondary | ICD-10-CM

## 2021-02-24 DIAGNOSIS — G8929 Other chronic pain: Secondary | ICD-10-CM

## 2021-02-24 DIAGNOSIS — M25511 Pain in right shoulder: Secondary | ICD-10-CM

## 2021-02-24 NOTE — Therapy (Signed)
[facility] [address] [place], [place], [address] Phone: [phone]   Fax:  [phone]  Physical Therapy Treatment  Patient Details  Name: David Carroll MRN: [id number] Date of Birth: [birth year] Referring Provider (PT): Elinor Dodge MD   Encounter Date: 02/24/2021   PT End of Session - 02/24/21 1350     Visit Number 9    Number of Visits 12    Date for PT Re-Evaluation 03/03/21    Authorization Type FOTO AT LEAST EVERY 5TH VISIT.  PROGRESS NOTE AT 10TH VISIT.  KX MODIFIER AFTER 15 VISITS.    PT Start Time 1300    PT Stop Time 1350    PT Time Calculation (min) 50 min    Activity Tolerance Patient tolerated treatment well    Behavior During Therapy WFL for tasks assessed/performed             Past Medical History:  Diagnosis Date   Arthritis    KNEES   BPH (benign prostatic hypertrophy)    Complication of anesthesia SLOW RECOVERING FROM ANES. DRUGS   HX OPIATE ADDICTION-- PT ON SUBOXONE   Depression    Glaucoma    History of gastric ulcer    History of gastroesophageal reflux (GERD)    History of kidney stones    Hydronephrosis, left    Hypertension    Nephrolithiasis    Opiate addiction (HCC)    Urgency of urination     Past Surgical History:  Procedure Laterality Date   CATARACT EXTRACTION W/ INTRAOCULAR LENS  IMPLANT, BILATERAL     CYSTOSCOPY WITH BIOPSY Left 11/06/2012   Procedure: CYSTOSCOPY WITH BIOPSY;  Surgeon: Sebastian Ache, MD;  Location: [facility];  Service: Urology;  Laterality: Left;   EXTRACORPOREAL SHOCK WAVE LITHOTRIPSY Left 03-06-2011   HOLMIUM LASER APPLICATION Left 11/06/2012   Procedure: HOLMIUM LASER APPLICATION;  Surgeon: Sebastian Ache, MD;  Location: [facility];  Service: Urology;  Laterality: Left;   INGUINAL HERNIA REPAIR Bilateral 1970   RE-DO LEFT 1985   LUMBAR DISC SURGERY  09-24-2008   L3 -- L4   LUMBAR LAMINECTOMY  05/15/2019   with  partial facet joint removal   MEDIAL COMPARTMENT ARTHROPLASTY, LEFT KNEE  02-22-2010   REMOVAL BENIGN MASS LEFT LUNG  1977   VAGOTOMY  1986   ULCERS    There were no vitals filed for this visit.   Subjective Assessment - 02/24/21 1312     Subjective COVID-19 screen performed prior to patient entering clinic. Aggie Hacker is doing a little better    Pertinent History Arthrits, h/o piriformis syndrome and sciatica, left knee surgery, lumbar surgery,    How long can you walk comfortably? Short community distances.    Patient Stated Goals Use right arm again without pain.    Currently in Pain? Yes    Pain Score 1     Pain Location Shoulder    Pain Orientation Right    Pain Descriptors / Indicators Sore;Discomfort    Pain Type Acute pain                               [facility] Adult PT Treatment/Exercise - 02/24/21 0001       Exercises   Exercises Shoulder      Shoulder Exercises: Seated   Protraction AROM;Right;20 reps   2x10   Flexion Right;20 reps;AAROM    Abduction Right;15 reps;AAROM  Shoulder Exercises: Pulleys   Flexion 3 minutes    Other Pulley Exercises standing UE ranger CW/CCW and flexion 2x10 each      Shoulder Exercises: Isometric Strengthening   Flexion 5X5"    Extension 5X5"    External Rotation 5X5"    Internal Rotation 5X5"      Modalities   Modalities Vasopneumatic      Electrical Stimulation   Electrical Stimulation Goals Pain      Vasopneumatic   Number Minutes Vasopneumatic  10 minutes    Vasopnuematic Location  Shoulder    Vasopneumatic Pressure Low    Vasopneumatic Temperature  34/pain      Manual Therapy   Manual Therapy Passive ROM    Passive ROM P/AAROM of R shoulder into flex/ ER                       PT Short Term Goals - 01/20/21 1227       PT SHORT TERM GOAL #1   Title Independent with an initial HEP    Time 4    Period Weeks    Status New    Target Date 02/17/21               PT Long  Term Goals - 01/20/21 1227       PT LONG TERM GOAL #1   Title Independent with an advanced HEP.    Time 6    Status New    Target Date 03/03/21      PT LONG TERM GOAL #2   Title Active right shoulder flexion to functional 145 degrees to improve overhead reaching.    Time 6    Period Weeks    Status New    Target Date 03/03/21      PT LONG TERM GOAL #3   Title Active ER to 70 degrees+ to allow for easily donning/doffing of apparel.    Time 6    Period Weeks    Status New      PT LONG TERM GOAL #4   Title Increase right shoulder strength to a solid 4 to 4+/5 to increase stability for performance of functional and recreational activities.    Time 6    Period Weeks    Status New    Target Date 03/03/21      PT LONG TERM GOAL #5   Title Perform ADL's with pain not > 3/10.    Time 6    Period Weeks    Target Date 03/03/21                   Plan - 02/24/21 1401     Clinical Impression Statement Pt arrived today reporting less pain in RT shldr and using it more. Rx focused om AAROM, isometrics as well as light strengthening.    Personal Factors and Comorbidities Comorbidity 1;Other    Comorbidities Arthrits, h/o piriformis syndrome and sciatica, left knee surgery, lumbar surgery,    Stability/Clinical Decision Making Evolving/Moderate complexity    Rehab Potential Good    PT Frequency 2x / week    PT Duration 6 weeks    PT Treatment/Interventions ADLs/Self Care Home Management;Cryotherapy;Electrical Stimulation;Ultrasound;Moist Heat;Iontophoresis 4mg /ml Dexamethasone;Therapeutic activities;Therapeutic exercise;Manual techniques;Patient/family education;Passive range of motion;Vasopneumatic Device    PT Next Visit Plan Begin with right shoulder, PAROM, progress to AAROM, modalties to decrease pain. isometrics  with low grade    Consulted and Agree with Plan of Care Patient  Patient will benefit from skilled therapeutic intervention in order to  improve the following deficits and impairments:     Visit Diagnosis: Acute pain of right shoulder  Stiffness of right shoulder, not elsewhere classified  Chronic right shoulder pain     Problem List Patient Active Problem List   Diagnosis Date Noted   Fall 09/27/2019   Hypertension    Benign prostatic hyperplasia    Chronic back pain    Fall at home, initial encounter    Temporal bone fracture (HCC)    SDH (subdural hematoma) (HCC)    Marijuana abuse    Alcohol abuse    MDD (major depressive disorder) 09/18/2013   Opiate dependence (HCC) 09/18/2013    Elianys Conry,CHRIS, PTA 02/24/2021, 6:04 PM  [facility] [address] [place], [place], [address] Phone: [phone]   Fax:  [phone]  Name: David Carroll MRN: [id number] Date of Birth: 11/01/1940

## 2021-03-01 ENCOUNTER — Other Ambulatory Visit: Payer: Self-pay

## 2021-03-01 ENCOUNTER — Ambulatory Visit: Payer: Medicare Other | Admitting: Physical Therapy

## 2021-03-01 ENCOUNTER — Encounter: Payer: Self-pay | Admitting: Physical Therapy

## 2021-03-01 DIAGNOSIS — M25511 Pain in right shoulder: Secondary | ICD-10-CM

## 2021-03-01 DIAGNOSIS — M25611 Stiffness of right shoulder, not elsewhere classified: Secondary | ICD-10-CM

## 2021-03-01 NOTE — Therapy (Signed)
[facility] [place], [place], [address] Phone: [phone]   Fax:  [phone]  Physical Therapy Treatment  Patient Details  Name: David Carroll MRN: [id number] Date of Birth: 19-Aug-1940 Referring Provider (PT): Alphonzo Grieve MD   Encounter Date: 03/01/2021   PT End of Session - 03/01/21 1209     Visit Number 10    Number of Visits 12    Date for PT Re-Evaluation 03/10/21    PT Start Time 1133   Late for treatment.   PT Stop Time 1206    PT Time Calculation (min) 33 min    Activity Tolerance Patient tolerated treatment well    Behavior During Therapy WFL for tasks assessed/performed             Past Medical History:  Diagnosis Date   Arthritis    KNEES   BPH (benign prostatic hypertrophy)    Complication of anesthesia SLOW RECOVERING FROM ANES. DRUGS   HX OPIATE ADDICTION-- PT ON SUBOXONE   Depression    Glaucoma    History of gastric ulcer    History of gastroesophageal reflux (GERD)    History of kidney stones    Hydronephrosis, left    Hypertension    Nephrolithiasis    Opiate addiction ([place])    Urgency of urination     Past Surgical History:  Procedure Laterality Date   CATARACT EXTRACTION W/ INTRAOCULAR LENS  IMPLANT, BILATERAL     CYSTOSCOPY WITH BIOPSY Left 11/06/2012   Procedure: CYSTOSCOPY WITH BIOPSY;  Surgeon: Alexis Frock, MD;  Location: [facility];  Service: Urology;  Laterality: Left;   EXTRACORPOREAL SHOCK WAVE LITHOTRIPSY Left 03-06-2011   HOLMIUM LASER APPLICATION Left 12/03/7626   Procedure: HOLMIUM LASER APPLICATION;  Surgeon: Alexis Frock, MD;  Location: [facility];  Service: Urology;  Laterality: Left;   INGUINAL HERNIA REPAIR Bilateral 1970   RE-DO [place] SURGERY  09-24-2008   L3 -- L4   LUMBAR LAMINECTOMY  05/15/2019   with partial facet joint removal   MEDIAL COMPARTMENT ARTHROPLASTY, LEFT KNEE  02-22-2010    REMOVAL BENIGN MASS LEFT LUNG  1977   VAGOTOMY  1986   ULCERS    There were no vitals filed for this visit.   Subjective Assessment - 03/01/21 1212     Subjective COVID-19 screen performed prior to patient entering clinic.  Patient very pleased with his progress.  He spent a lot of time out of doors over the weekned working in yard and states it made his right shoulder feel better.    Pertinent History Arthrits, h/o piriformis syndrome and sciatica, left knee surgery, lumbar surgery,    How long can you walk comfortably? Short community distances.    Patient Stated Goals Use right arm again without pain.    Currently in Pain? Yes    Pain Score 1     Pain Location Shoulder    Pain Descriptors / Indicators Dull    Pain Type Acute pain    Pain Onset More than a month ago                               [facility] Adult PT Treatment/Exercise - 03/01/21 0001       Shoulder Exercises: Standing   Other Standing Exercises RW4 to fatigue with yellow band.    Other Standing Exercises Full can to fatigue and unresisted  right ahoulder abduction to fatigue      Shoulder Exercises: Pulleys   Flexion 5 minutes      Vasopneumatic   Number Minutes Vasopneumatic  18 minutes    Vasopnuematic Location  --   Right shoulder with pillow between thorax and elbow.   Vasopneumatic Pressure Low                     PT Education - 03/01/21 1213     Education Details Pics provided for RW4 and a handout provided for an over the door pulley system.    Person(s) Educated Patient    Methods Explanation;Demonstration;Tactile cues;Verbal cues;Handout    Comprehension Verbalized understanding;Returned demonstration;Tactile cues required;Need further instruction              PT Short Term Goals - 01/20/21 1227       PT SHORT TERM GOAL #1   Title Independent with an initial HEP    Time 4    Period Weeks    Status New    Target Date 02/17/21               PT Long  Term Goals - 03/01/21 1134       PT LONG TERM GOAL #1   Title Independent with an advanced HEP.    Time 6    Period Weeks    Status On-going      PT LONG TERM GOAL #2   Title Active right shoulder flexion to functional 145 degrees to improve overhead reaching.    Baseline 152 degrees (03/01/21).    Time 6    Period Weeks    Status Achieved      PT LONG TERM GOAL #3   Title Active ER to 70 degrees+ to allow for easily donning/doffing of apparel.    Baseline 90 degrees (03/01/21).    Time 6    Period Weeks    Status Achieved      PT LONG TERM GOAL #5   Title Perform ADL's with pain not > 3/10.    Baseline Did a lot of yardwork over the weekend with miniaml pain.    Time 6    Period Weeks    Status Achieved                   Plan - 03/01/21 1218     Clinical Impression Statement See "Therapy Note" section.    Personal Factors and Comorbidities Comorbidity 1;Other    Comorbidities Arthrits, h/o piriformis syndrome and sciatica, left knee surgery, lumbar surgery,    Examination-Activity Limitations Other;Reach Overhead    Examination-Participation Restrictions Other    Stability/Clinical Decision Making Evolving/Moderate complexity    Rehab Potential Good    PT Frequency 2x / week    PT Duration 6 weeks    PT Treatment/Interventions ADLs/Self Care Home Management;Cryotherapy;Electrical Stimulation;Ultrasound;Moist Heat;Iontophoresis 47m/ml Dexamethasone;Therapeutic activities;Therapeutic exercise;Manual techniques;Patient/family education;Passive range of motion;Vasopneumatic Device    PT Next Visit Plan Begin with right shoulder, PAROM, progress to AAROM, modalties to decrease pain. isometrics  with low grade    Consulted and Agree with Plan of Care Patient             Patient will benefit from skilled therapeutic intervention in order to improve the following deficits and impairments:  Pain, Decreased activity tolerance, Decreased strength, Decreased range of  motion  Visit Diagnosis: Acute pain of right shoulder  Stiffness of right shoulder, not elsewhere classified     Problem  List Patient Active Problem List   Diagnosis Date Noted   Fall 09/27/2019   Hypertension    Benign prostatic hyperplasia    Chronic back pain    Fall at home, initial encounter    Temporal bone fracture (HCC)    SDH (subdural hematoma) ([place])    Marijuana abuse    Alcohol abuse    MDD (major depressive disorder) 09/18/2013   Opiate dependence ([place]) 09/18/2013   Progress Note Reporting Period 01/20/21 to 03/01/21  See note below for Objective Data and Assessment of Progress/Goals.  The patient has made excellent progress with all but his strength goal met and finalizing an advanced HEP over the next two visits.    Sri Clegg, [place], PT 03/01/2021, 12:21 PM  [facility] [address] [place], [place], [address] Phone: [phone]   Fax:  [phone]  Name: David Carroll MRN: [id number] Date of Birth: August 09, 1940

## 2021-03-03 ENCOUNTER — Other Ambulatory Visit: Payer: Self-pay

## 2021-03-03 ENCOUNTER — Ambulatory Visit: Payer: Medicare Other | Admitting: Physical Therapy

## 2021-03-03 ENCOUNTER — Encounter: Payer: Self-pay | Admitting: Physical Therapy

## 2021-03-03 DIAGNOSIS — M25611 Stiffness of right shoulder, not elsewhere classified: Secondary | ICD-10-CM

## 2021-03-03 DIAGNOSIS — M25511 Pain in right shoulder: Secondary | ICD-10-CM | POA: Diagnosis not present

## 2021-03-03 NOTE — Therapy (Signed)
[facility] [address] [place], [place], [address] Phone: [phone]   Fax:  [phone]  Physical Therapy Treatment  Patient Details  Name: David Carroll MRN: [id number] Date of Birth: [birth year] Referring Provider (PT): Elinor Dodge MD   Encounter Date: 03/03/2021   PT End of Session - 03/03/21 1045     Visit Number 11    Number of Visits 12    Date for PT Re-Evaluation 03/10/21    Authorization Type FOTO AT LEAST EVERY 5TH VISIT.  PROGRESS NOTE AT 10TH VISIT.  KX MODIFIER AFTER 15 VISITS.    PT Start Time 1044    PT Stop Time 1113   late arrival   PT Time Calculation (min) 29 min    Activity Tolerance Patient tolerated treatment well    Behavior During Therapy WFL for tasks assessed/performed             Past Medical History:  Diagnosis Date   Arthritis    KNEES   BPH (benign prostatic hypertrophy)    Complication of anesthesia SLOW RECOVERING FROM ANES. DRUGS   HX OPIATE ADDICTION-- PT ON SUBOXONE   Depression    Glaucoma    History of gastric ulcer    History of gastroesophageal reflux (GERD)    History of kidney stones    Hydronephrosis, left    Hypertension    Nephrolithiasis    Opiate addiction (HCC)    Urgency of urination     Past Surgical History:  Procedure Laterality Date   CATARACT EXTRACTION W/ INTRAOCULAR LENS  IMPLANT, BILATERAL     CYSTOSCOPY WITH BIOPSY Left 11/06/2012   Procedure: CYSTOSCOPY WITH BIOPSY;  Surgeon: Sebastian Ache, MD;  Location: [facility];  Service: Urology;  Laterality: Left;   EXTRACORPOREAL SHOCK WAVE LITHOTRIPSY Left 03-06-2011   HOLMIUM LASER APPLICATION Left 11/06/2012   Procedure: HOLMIUM LASER APPLICATION;  Surgeon: Sebastian Ache, MD;  Location: [facility];  Service: Urology;  Laterality: Left;   INGUINAL HERNIA REPAIR Bilateral 1970   RE-DO LEFT 1985   LUMBAR DISC SURGERY  09-24-2008   L3 -- L4   LUMBAR LAMINECTOMY   05/15/2019   with partial facet joint removal   MEDIAL COMPARTMENT ARTHROPLASTY, LEFT KNEE  02-22-2010   REMOVAL BENIGN MASS LEFT LUNG  1977   VAGOTOMY  1986   ULCERS    There were no vitals filed for this visit.   Subjective Assessment - 03/03/21 1044     Subjective COVID-19 screen performed prior to patient entering clinic. Patient reports that he fell backwards after trying to stand up repeatedly. Fell backwards into the floor. MD pleased with recovery.    Pertinent History Arthrits, h/o piriformis syndrome and sciatica, left knee surgery, lumbar surgery,    How long can you walk comfortably? Short community distances.    Patient Stated Goals Use right arm again without pain.    Currently in Pain? Yes    Pain Score 2     Pain Location Shoulder    Pain Orientation Right    Pain Descriptors / Indicators Discomfort    Pain Type Acute pain    Pain Onset More than a month ago    Pain Frequency Intermittent                [facility] PT Assessment - 03/03/21 0001       Assessment   Medical Diagnosis R shoulder rotator cuff tear, avulsion fracture    Referring Provider (PT) Cristal Deer  B[place]field MD    Onset Date/Surgical Date 01/03/21    Prior Therapy Yes      Precautions   Precautions Fall    Precaution Comments High risk for falls - repeated falls                           [facility] Adult PT Treatment/Exercise - 03/03/21 0001       Shoulder Exercises: Seated   Extension Strengthening;Right;20 reps;Theraband    Theraband Level (Shoulder Extension) Level 1 (Yellow)    Retraction Strengthening;Right;20 reps;Theraband    Theraband Level (Shoulder Retraction) Level 1 (Yellow)    Protraction AROM;Right;20 reps    External Rotation Strengthening;Right;20 reps;Theraband    Theraband Level (Shoulder External Rotation) Level 1 (Yellow)    Internal Rotation Strengthening;Right;20 reps;Theraband    Theraband Level (Shoulder Internal Rotation) Level 1 (Yellow)     Flexion AROM;Right;20 reps    Other Seated Exercises R bicep curl yellow theraband x20 reps      Shoulder Exercises: Standing   Other Standing Exercises Ball on table for circles for prolonged muscle capacity xfatigue      Shoulder Exercises: Pulleys   Flexion 5 minutes      Shoulder Exercises: ROM/Strengthening   Nustep L4 x10 min      Shoulder Exercises: Stretch   External Rotation Stretch 3 reps;10 seconds   overpressure via PTA                      PT Short Term Goals - 01/20/21 1227       PT SHORT TERM GOAL #1   Title Independent with an initial HEP    Time 4    Period Weeks    Status New    Target Date 02/17/21               PT Long Term Goals - 03/01/21 1134       PT LONG TERM GOAL #1   Title Independent with an advanced HEP.    Time 6    Period Weeks    Status On-going      PT LONG TERM GOAL #2   Title Active right shoulder flexion to functional 145 degrees to improve overhead reaching.    Baseline 152 degrees (03/01/21).    Time 6    Period Weeks    Status Achieved      PT LONG TERM GOAL #3   Title Active ER to 70 degrees+ to allow for easily donning/doffing of apparel.    Baseline 90 degrees (03/01/21).    Time 6    Period Weeks    Status Achieved      PT LONG TERM GOAL #5   Title Perform ADL's with pain not > 3/10.    Baseline Did a lot of yardwork over the weekend with miniaml pain.    Time 6    Period Weeks    Status Achieved                   Plan - 03/03/21 1127     Clinical Impression Statement Patient presented in clinic with reports of minimal discomfort of R shoulder. Patient able to tolerate all light strengthening with fatigue reported throughout. Patient reported painful arc at approximately 90 deg flexion. More prolonged muscle training completed at waist height to simulate for gardening.    Personal Factors and Comorbidities Comorbidity 1;Other    Comorbidities Arthrits, h/o piriformis syndrome  and  sciatica, left knee surgery, lumbar surgery,    Examination-Activity Limitations Other;Reach Overhead    Examination-Participation Restrictions Other    Stability/Clinical Decision Making Evolving/Moderate complexity    Rehab Potential Good    PT Frequency 2x / week    PT Duration 6 weeks    PT Treatment/Interventions ADLs/Self Care Home Management;Cryotherapy;Electrical Stimulation;Ultrasound;Moist Heat;Iontophoresis 4mg /ml Dexamethasone;Therapeutic activities;Therapeutic exercise;Manual techniques;Patient/family education;Passive range of motion;Vasopneumatic Device    PT Next Visit Plan D/C summary next visit.    Consulted and Agree with Plan of Care Patient             Patient will benefit from skilled therapeutic intervention in order to improve the following deficits and impairments:  Pain, Decreased activity tolerance, Decreased strength, Decreased range of motion  Visit Diagnosis: Acute pain of right shoulder  Stiffness of right shoulder, not elsewhere classified     Problem List Patient Active Problem List   Diagnosis Date Noted   Fall 09/27/2019   Hypertension    Benign prostatic hyperplasia    Chronic back pain    Fall at home, initial encounter    Temporal bone fracture (HCC)    SDH (subdural hematoma) (HCC)    Marijuana abuse    Alcohol abuse    MDD (major depressive disorder) 09/18/2013   Opiate dependence (HCC) 09/18/2013    11/18/2013, PTA 03/03/2021, 12:11 PM  [facility] [address] [place], [place], [place] Phone: [phone]   Fax:  [phone]  Name: David Carroll MRN: Geronimo Boot Date of Birth: 1940-08-20

## 2021-03-08 ENCOUNTER — Other Ambulatory Visit: Payer: Self-pay

## 2021-03-08 ENCOUNTER — Encounter: Payer: Self-pay | Admitting: Physical Therapy

## 2021-03-08 ENCOUNTER — Ambulatory Visit: Payer: Medicare Other | Admitting: Physical Therapy

## 2021-03-08 DIAGNOSIS — M25511 Pain in right shoulder: Secondary | ICD-10-CM | POA: Diagnosis not present

## 2021-03-08 DIAGNOSIS — M25611 Stiffness of right shoulder, not elsewhere classified: Secondary | ICD-10-CM

## 2021-03-08 NOTE — Therapy (Addendum)
[facility] [place], [place], [address] Phone: [phone]   Fax:  [phone]  Physical Therapy Treatment  Patient Details  Name: David Carroll MRN: [id number] Date of Birth: 08/26/1940 Referring Provider (PT): Alphonzo Grieve MD   Encounter Date: 03/08/2021   PT End of Session - 03/08/21 1125     Visit Number 12    Number of Visits 12    Date for PT Re-Evaluation 03/10/21    Authorization Type FOTO AT LEAST EVERY 5TH VISIT.  PROGRESS NOTE AT 10TH VISIT.  KX MODIFIER AFTER 15 VISITS.    PT Start Time 1121    PT Stop Time 1156   late arrival and fatigue   PT Time Calculation (min) 35 min    Activity Tolerance Patient tolerated treatment well    Behavior During Therapy WFL for tasks assessed/performed             Past Medical History:  Diagnosis Date   Arthritis    KNEES   BPH (benign prostatic hypertrophy)    Complication of anesthesia SLOW RECOVERING FROM ANES. DRUGS   HX OPIATE ADDICTION-- PT ON SUBOXONE   Depression    Glaucoma    History of gastric ulcer    History of gastroesophageal reflux (GERD)    History of kidney stones    Hydronephrosis, left    Hypertension    Nephrolithiasis    Opiate addiction ([place])    Urgency of urination     Past Surgical History:  Procedure Laterality Date   CATARACT EXTRACTION W/ INTRAOCULAR LENS  IMPLANT, BILATERAL     CYSTOSCOPY WITH BIOPSY Left 11/06/2012   Procedure: CYSTOSCOPY WITH BIOPSY;  Surgeon: Alexis Frock, MD;  Location: [facility];  Service: Urology;  Laterality: Left;   EXTRACORPOREAL SHOCK WAVE LITHOTRIPSY Left 03-06-2011   HOLMIUM LASER APPLICATION Left 9/76/7341   Procedure: HOLMIUM LASER APPLICATION;  Surgeon: Alexis Frock, MD;  Location: [facility];  Service: Urology;  Laterality: Left;   INGUINAL HERNIA REPAIR Bilateral 1970   RE-DO [place] SURGERY  09-24-2008   L3 -- L4   LUMBAR  LAMINECTOMY  05/15/2019   with partial facet joint removal   MEDIAL COMPARTMENT ARTHROPLASTY, LEFT KNEE  02-22-2010   REMOVAL BENIGN MASS LEFT LUNG  1977   VAGOTOMY  1986   ULCERS    There were no vitals filed for this visit.   Subjective Assessment - 03/08/21 1123     Subjective COVID-19 screen performed prior to patient entering clinic. Reports he always has a minimal ache but nothing bad.    Pertinent History Arthrits, h/o piriformis syndrome and sciatica, left knee surgery, lumbar surgery,    How long can you walk comfortably? Short community distances.    Patient Stated Goals Use right arm again without pain.    Currently in Pain? Yes    Pain Score --   "a little but not bad"   Pain Location Shoulder    Pain Orientation Right    Pain Descriptors / Indicators Aching    Pain Type Acute pain    Pain Onset More than a month ago    Pain Frequency Intermittent                [facility] PT Assessment - 03/08/21 0001       Assessment   Medical Diagnosis R shoulder rotator cuff tear, avulsion fracture    Referring Provider (PT) Alphonzo Grieve MD  Onset Date/Surgical Date 01/03/21    Prior Therapy Yes      Precautions   Precautions Fall    Precaution Comments High risk for falls - repeated falls      Observation/Other Assessments   Focus on Therapeutic Outcomes (FOTO)  39% limitation 12th visit      ROM / Strength   AROM / PROM / Strength Strength      Strength   Strength Assessment Site Shoulder    Right/Left Shoulder Right                           [facility] Adult PT Treatment/Exercise - 03/08/21 0001       Shoulder Exercises: Seated   Extension Strengthening;Right;20 reps;Theraband    Theraband Level (Shoulder Extension) Level 2 (Red)    Retraction Strengthening;Right;20 reps;Theraband    Theraband Level (Shoulder Retraction) Level 2 (Red)    Protraction Strengthening;Right;20 reps;Weights    Protraction Weight (lbs) 1    External  Rotation Strengthening;Right;20 reps;Theraband    Theraband Level (Shoulder External Rotation) Level 2 (Red)    Internal Rotation Strengthening;Right;20 reps;Theraband    Theraband Level (Shoulder Internal Rotation) Level 2 (Red)    Flexion Strengthening;Right;20 reps;Weights    Flexion Weight (lbs) 1    Abduction Strengthening;Right;5 reps    ABduction Weight (lbs) 1    ABduction Limitations due to pain    Other Seated Exercises R bicep curl 3#x20 reps      Shoulder Exercises: Standing   Horizontal ABduction Strengthening;Both;15 reps;Theraband    Theraband Level (Shoulder Horizontal ABduction) Level 2 (Red)    Horizontal ABduction Limitations step outs    External Rotation Strengthening;Both;15 reps;Theraband    Theraband Level (Shoulder External Rotation) Level 2 (Red)    External Rotation Limitations with wall slide    Flexion Strengthening;Right;10 reps;Weights    Shoulder Flexion Weight (lbs) 1    Flexion Limitations to 3 shelves at different heights    Other Standing Exercises wall slides with eccentric lowering x20 reps    Other Standing Exercises ball rolls on waist height table to simulate gardening; scaption to 2 shelves x5 reps each 1#      Shoulder Exercises: ROM/Strengthening   UBE (Upper Arm Bike) 60 RPM x8 min (forward/backward)    Wall Pushups 20 reps                       PT Short Term Goals - 03/08/21 1143       PT SHORT TERM GOAL #1   Title Independent with an initial HEP    Time 4    Period Weeks    Status Achieved    Target Date 02/17/21               PT Long Term Goals - 03/08/21 1143       PT LONG TERM GOAL #1   Title Independent with an advanced HEP.    Time 6    Period Weeks    Status On-going      PT LONG TERM GOAL #2   Title Active right shoulder flexion to functional 145 degrees to improve overhead reaching.    Baseline 152 degrees (03/01/21).    Time 6    Period Weeks    Status Achieved      PT LONG TERM GOAL #3    Title Active ER to 70 degrees+ to allow for easily donning/doffing of apparel.  Baseline 90 degrees (03/01/21).    Time 6    Period Weeks    Status Achieved      PT LONG TERM GOAL #4   Title Increase right shoulder strength to a solid 4 to 4+/5 to increase stability for performance of functional and recreational activities.    Time 6    Period Weeks    Status Unable to assess      PT LONG TERM GOAL #5   Title Perform ADL's with pain not > 3/10.    Baseline Did a lot of yardwork over the weekend with miniaml pain.    Time 6    Period Weeks    Status Achieved                   Plan - 03/08/21 1207     Clinical Impression Statement Patient presented in clinic with minimal ache but remains active at home and in his yard/garden. Patient able to tolerate light strengthening although he fatigues fairly quickly. Patient able to complete all assigned activities but limited due to muscle fatigue. MMT not assessed today although WNL for exercises due to muscle fatigue reported by end of session .    Personal Factors and Comorbidities Comorbidity 1;Other    Comorbidities Arthrits, h/o piriformis syndrome and sciatica, left knee surgery, lumbar surgery,    Examination-Activity Limitations Other;Reach Overhead    Examination-Participation Restrictions Other    Stability/Clinical Decision Making Evolving/Moderate complexity    Rehab Potential Good    PT Frequency 2x / week    PT Duration 6 weeks    PT Treatment/Interventions ADLs/Self Care Home Management;Cryotherapy;Electrical Stimulation;Ultrasound;Moist Heat;Iontophoresis 33m/ml Dexamethasone;Therapeutic activities;Therapeutic exercise;Manual techniques;Patient/family education;Passive range of motion;Vasopneumatic Device    PT Next Visit Plan D/C    Consulted and Agree with Plan of Care Patient             Patient will benefit from skilled therapeutic intervention in order to improve the following deficits and impairments:   Pain, Decreased activity tolerance, Decreased strength, Decreased range of motion  Visit Diagnosis: Acute pain of right shoulder  Stiffness of right shoulder, not elsewhere classified     Problem List Patient Active Problem List   Diagnosis Date Noted   Fall 09/27/2019   Hypertension    Benign prostatic hyperplasia    Chronic back pain    Fall at home, initial encounter    Temporal bone fracture (HCC)    SDH (subdural hematoma) (HCC)    Marijuana abuse    Alcohol abuse    MDD (major depressive disorder) 09/18/2013   Opiate dependence ([place] 09/18/2013   KStandley Brooking PTA 03/08/21 12:17 PM   [facility] [address]MS[address] [place] [address]Phone: [phone]  Fax:  [phone] Name: David SprattMRN: [id number] of Birth: 3November 25, 1942 PHYSICAL THERAPY DISCHARGE SUMMARY  Visits from Start of Care: 12.  Current functional level related to goals / functional outcomes: See above.   Remaining deficits: See goal section.   Education / Equipment: HEP.   Patient agrees to discharge. Patient goals were partially met. Patient is being discharged due to being pleased with the current functional level.    CMaliApplegate MPT

## 2021-05-25 ENCOUNTER — Other Ambulatory Visit: Payer: Self-pay

## 2021-05-25 ENCOUNTER — Encounter: Payer: Self-pay | Admitting: Physical Therapy

## 2021-05-25 ENCOUNTER — Ambulatory Visit: Payer: Medicare Other | Attending: Sports Medicine | Admitting: Physical Therapy

## 2021-05-25 DIAGNOSIS — M6281 Muscle weakness (generalized): Secondary | ICD-10-CM

## 2021-05-25 NOTE — Therapy (Signed)
[facility] [address] [place], [place], [address] Phone: [phone]   Fax:  [phone]  Physical Therapy Evaluation  Patient Details  Name: David Carroll MRN: [id number] Date of Birth: 01-25-1941 Referring Provider (PT): Delano Metz MD   Encounter Date: 05/25/2021   PT End of Session - 05/25/21 1130     Visit Number 1    Number of Visits 12    Date for PT Re-Evaluation 07/06/21    PT Start Time 1132    PT Stop Time 1158    PT Time Calculation (min) 26 min    Activity Tolerance Patient tolerated treatment well    Behavior During Therapy [facility] for tasks assessed/performed             Past Medical History:  Diagnosis Date   Arthritis    KNEES   BPH (benign prostatic hypertrophy)    Complication of anesthesia SLOW RECOVERING FROM ANES. DRUGS   HX OPIATE ADDICTION-- PT ON SUBOXONE   Depression    Glaucoma    History of gastric ulcer    History of gastroesophageal reflux (GERD)    History of kidney stones    Hydronephrosis, left    Hypertension    Nephrolithiasis    Opiate addiction (HCC)    Urgency of urination     Past Surgical History:  Procedure Laterality Date   CATARACT EXTRACTION W/ INTRAOCULAR LENS  IMPLANT, BILATERAL     CYSTOSCOPY WITH BIOPSY Left 11/06/2012   Procedure: CYSTOSCOPY WITH BIOPSY;  Surgeon: Sebastian Ache, MD;  Location: [facility];  Service: Urology;  Laterality: Left;   EXTRACORPOREAL SHOCK WAVE LITHOTRIPSY Left 03-06-2011   HOLMIUM LASER APPLICATION Left 11/06/2012   Procedure: HOLMIUM LASER APPLICATION;  Surgeon: Sebastian Ache, MD;  Location: [facility];  Service: Urology;  Laterality: Left;   INGUINAL HERNIA REPAIR Bilateral 1970   RE-DO LEFT 1985   LUMBAR DISC SURGERY  09-24-2008   L3 -- L4   LUMBAR LAMINECTOMY  05/15/2019   with partial facet joint removal   MEDIAL COMPARTMENT ARTHROPLASTY, LEFT KNEE  02-22-2010   REMOVAL BENIGN MASS LEFT LUNG   1977   VAGOTOMY  1986   ULCERS    There were no vitals filed for this visit.    Subjective Assessment - 05/25/21 1132     Subjective Patient fell onto the floor on 05/14/21 from his bed. He was unable to get up and his wife was away. He had to wait over 6 hours until his son came to help him up. He complains feeling weak with ADLs. He was seen recently for his right shoulder which is still weak.    Pertinent History Arthrits, h/o piriformis syndrome and sciatica, left knee surgery, lumbar surgery,    Patient Stated Goals Use right arm again without pain.    Currently in Pain? No/denies                [facility] PT Assessment - 05/25/21 0001       Assessment   Medical Diagnosis muscular deconditioning    Referring Provider (PT) Royston Sinner Corrington MD    Onset Date/Surgical Date 05/14/21    Prior Therapy y      Precautions   Precautions Fall    Precaution Comments repeated falls in the past      Balance Screen   Has the patient fallen in the past 6 months Yes    How many times? 3    Has the patient  had a decrease in activity level because of a fear of falling?  No    Is the patient reluctant to leave their home because of a fear of falling?  No      Home Environment   Living Environment Private residence    Living Arrangements Spouse/significant other      Prior Function   Level of Independence Independent      Coordination   Gross Motor Movements are Fluid and Coordinated Yes      Functional Tests   Functional tests Step up;Step down;Sit to Stand      Step Up   Comments requires some assist of one UE using 8 inch step      Step Down   Comments WNL      Sit to Stand   Comments uses backs of legs to assist standing      Posture/Postural Control   Posture/Postural Control Postural limitations    Postural Limitations Rounded Shoulders;Forward head      ROM / Strength   AROM / PROM / Strength AROM;Strength      AROM   Overall AROM Comments functional upper and  lower extremity ROM      Strength   Strength Assessment Site Shoulder;Hip;Knee    Right/Left Shoulder Right;Left    Right Shoulder Flexion 5/5    Right Shoulder Extension 4+/5    Right Shoulder ABduction 5/5    Right Shoulder Internal Rotation 4+/5    Right Shoulder External Rotation 4+/5    Left Shoulder Flexion 5/5    Left Shoulder Extension 5/5    Left Shoulder ABduction 5/5    Left Shoulder Internal Rotation 5/5    Left Shoulder External Rotation 5/5    Right/Left Elbow Right;Left    Right/Left Hip Right;Left    Right Hip Flexion 4/5    Right Hip Extension 5/5    Right Hip ABduction 5/5    Left Hip Flexion 4+/5    Left Hip Extension 4/5    Left Hip ABduction 5/5    Right/Left Knee Right;Left    Right Knee Flexion 5/5    Right Knee Extension 5/5    Left Knee Flexion 4+/5    Left Knee Extension 5/5      Transfers   Five time sit to stand comments  14.24 sec uses backs of legs      Ambulation/Gait   Ambulation/Gait Yes    Ambulation/Gait Assistance 6: Modified independent (Device/Increase time)    Ambulation Distance (Feet) 30 Feet    Assistive device None    Gait Pattern Step-through pattern;Decreased step length - right;Decreased step length - left;Decreased stride length;Decreased dorsiflexion - right;Decreased dorsiflexion - left    Ambulation Surface Level;Indoor    Gait velocity slow    Gait Comments able to improve heel strike with VCs                        Objective measurements completed on examination: See above findings.                PT Education - 05/25/21 1202     Education Details HEP; cautioned to be aware of toe clearance with gait    Person(s) Educated Patient    Methods Explanation;Demonstration;Handout    Comprehension Verbalized understanding;Returned demonstration              PT Short Term Goals - 05/25/21 1215       PT SHORT TERM GOAL #1  Title Independent with an initial HEP    Time 3    Period  Weeks    Status New    Target Date 06/15/21               PT Long Term Goals - 05/25/21 1215       PT LONG TERM GOAL #1   Title Independent with an advanced HEP.    Time 6    Period Weeks    Status New    Target Date 07/06/21      PT LONG TERM GOAL #2   Title Patient able to transfer from floor to standing with assist of furniture.    Baseline -    Time 6    Period Weeks    Status New      PT LONG TERM GOAL #3   Title Improved 5x sit to stand in <= 11 seconds demonstrating improved functional strength and decreasing fall risk.    Baseline -    Time 6    Period Weeks    Status New      PT LONG TERM GOAL #4   Title Patient able to climb stairs at home without difficulty using UE for balance only.    Time 6    Period Weeks    Status New      PT LONG TERM GOAL #5   Title Pt to demonstrate improved heel strike with gait bil.    Baseline -    Time 6    Period Weeks    Status New                    Plan - 05/25/21 1202     Clinical Impression Statement Patient presents with c/o of general muscle weakness and fatigue with ADLs. He recently slipped out of his bed and was unable to get off the floor independently. He also reports weakness with stair climbing. He has stairs at home. He has generally good strength in his upper and lower extremities, but demonstrates functional weakness with sit to stands, step ups and bridging. He was 15 min late for his appointment to further assessment of floor to stand tranfer, lunging or squatting are being deferred until next visit.  With gait, he does have decreased heel strike bil R>L. He has had repeated falls when turning in the past 6 months (and was seen in this clinic for this), but reports this has improved. He denies falls except for the slip off the bed. He will benefit from skilled PT to address these deficits and gain strength and endurance to improve functional ability and safety at home.    Personal Factors and  Comorbidities Age    Comorbidities right shoulder pain, Arthrits, h/o piriformis syndrome and sciatica, left knee surgery, lumbar surgery,    Examination-Activity Limitations Transfers;Stairs    Stability/Clinical Decision Making Stable/Uncomplicated    Clinical Decision Making Low    Rehab Potential Excellent    PT Frequency 2x / week    PT Duration 6 weeks    PT Treatment/Interventions ADLs/Self Care Home Management;Gait training;Stair training;Functional mobility training;Therapeutic activities;Therapeutic exercise;Balance training;Neuromuscular re-education;Patient/family education;Energy conservation    PT Next Visit Plan assess floor to stand transfer and work on this, assess lunge and squat; Cardio for endurance, functional strengthening (squats, stairs, lunges), hip extension strenth; add gastroc stretch to HEP    PT Home Exercise Plan [id number] Supine Bridge - 2 x daily - 7 x weekly - 1-3 sets -  10 reps  Forward Step Up with Unilateral Counter Support - 1 x daily - 7 x weekly - 2 sets - 10 reps  Sit to Stand - 2 x daily - 7 x weekly - 1-2 sets - 10 reps             Patient will benefit from skilled therapeutic intervention in order to improve the following deficits and impairments:  Abnormal gait, Pain, Decreased strength, Impaired flexibility, Decreased endurance  Visit Diagnosis: Muscle weakness (generalized)     Problem List Patient Active Problem List   Diagnosis Date Noted   Fall 09/27/2019   Hypertension    Benign prostatic hyperplasia    Chronic back pain    Fall at home, initial encounter    Temporal bone fracture (HCC)    SDH (subdural hematoma)    Marijuana abuse    Alcohol abuse    MDD (major depressive disorder) 09/18/2013   Opiate dependence (HCC) 09/18/2013   Solon Palm, PT 05/25/2021, 12:33 PM  [facility] [address] [place], [place], [address] Phone: [phone]   Fax:  [phone]  Name:  David Carroll MRN: [id number] Date of Birth: [birth year]

## 2021-05-25 NOTE — Patient Instructions (Signed)
Access Code: [facility] URL: [email or web address] Date: 05/25/2021 Prepared by: Raynelle Fanning  Exercises Supine Bridge - 2 x daily - 7 x weekly - 1-3 sets - 10 reps Forward Step Up with Unilateral Counter Support - 1 x daily - 7 x weekly - 2 sets - 10 reps Sit to Stand - 2 x daily - 7 x weekly - 1-2 sets - 10 reps

## 2021-05-31 ENCOUNTER — Other Ambulatory Visit: Payer: Self-pay

## 2021-05-31 ENCOUNTER — Ambulatory Visit: Payer: Medicare Other

## 2021-05-31 DIAGNOSIS — M6281 Muscle weakness (generalized): Secondary | ICD-10-CM | POA: Diagnosis not present

## 2021-05-31 NOTE — Therapy (Signed)
[facility] [address] [place], [place], [address] Phone: [phone]   Fax:  [phone]  Physical Therapy Treatment  Patient Details  Name: David Carroll MRN: [id number] Date of Birth: 11/21/1940 Referring Provider (PT): Delano Metz MD   Encounter Date: 05/31/2021   PT End of Session - 05/31/21 1307     Visit Number 2    Number of Visits 12    Date for PT Re-Evaluation 07/06/21    PT Start Time 1302    PT Stop Time 1350    PT Time Calculation (min) 48 min    Activity Tolerance Patient tolerated treatment well    Behavior During Therapy [facility] for tasks assessed/performed             Past Medical History:  Diagnosis Date   Arthritis    KNEES   BPH (benign prostatic hypertrophy)    Complication of anesthesia SLOW RECOVERING FROM ANES. DRUGS   HX OPIATE ADDICTION-- PT ON SUBOXONE   Depression    Glaucoma    History of gastric ulcer    History of gastroesophageal reflux (GERD)    History of kidney stones    Hydronephrosis, left    Hypertension    Nephrolithiasis    Opiate addiction (HCC)    Urgency of urination     Past Surgical History:  Procedure Laterality Date   CATARACT EXTRACTION W/ INTRAOCULAR LENS  IMPLANT, BILATERAL     CYSTOSCOPY WITH BIOPSY Left 11/06/2012   Procedure: CYSTOSCOPY WITH BIOPSY;  Surgeon: Sebastian Ache, MD;  Location: [facility];  Service: Urology;  Laterality: Left;   EXTRACORPOREAL SHOCK WAVE LITHOTRIPSY Left 03-06-2011   HOLMIUM LASER APPLICATION Left 11/06/2012   Procedure: HOLMIUM LASER APPLICATION;  Surgeon: Sebastian Ache, MD;  Location: [facility];  Service: Urology;  Laterality: Left;   INGUINAL HERNIA REPAIR Bilateral 1970   RE-DO LEFT 1985   LUMBAR DISC SURGERY  09-24-2008   L3 -- L4   LUMBAR LAMINECTOMY  05/15/2019   with partial facet joint removal   MEDIAL COMPARTMENT ARTHROPLASTY, LEFT KNEE  02-22-2010   REMOVAL BENIGN MASS LEFT LUNG   1977   VAGOTOMY  1986   ULCERS    There were no vitals filed for this visit.   Subjective Assessment - 05/31/21 1302     Subjective Patient reports that he feels "fair." He notes that he has had difficulty doing his HEP. He has not fallen since his last appointment.    Pertinent History Arthrits, h/o piriformis syndrome and sciatica, left knee surgery, lumbar surgery,    Patient Stated Goals Use right arm again without pain.    Currently in Pain? Yes    Pain Location Shoulder    Pain Orientation Right                               [facility] Adult PT Treatment/Exercise - 05/31/21 0001       Exercises   Exercises Knee/Hip      Knee/Hip Exercises: Standing   Forward Step Up Right;20 reps;Hand Hold: 2;Step Height: 4"    Functional Squat 20 reps   with BUE support; with chair behind him for cuing for proper performance     Knee/Hip Exercises: Seated   Long Arc Quad Strengthening;Both;Weights   3 minutes   Long Arc Quad Weight 3 lbs.    Other Seated Knee/Hip Exercises Heel/toe raises   3 minutes; 2  pound ankle weights   Other Seated Knee/Hip Exercises Clam   3 minutes     Knee/Hip Exercises: Supine   Hip Adduction Isometric Both;Other (comment)   seated; 5 second hold; 2 minutes     Shoulder Exercises: ROM/Strengthening   Nustep L4 x 12 minutes                       PT Short Term Goals - 05/25/21 1215       PT SHORT TERM GOAL #1   Title Independent with an initial HEP    Time 3    Period Weeks    Status New    Target Date 06/15/21               PT Long Term Goals - 05/25/21 1215       PT LONG TERM GOAL #1   Title Independent with an advanced HEP.    Time 6    Period Weeks    Status New    Target Date 07/06/21      PT LONG TERM GOAL #2   Title Patient able to transfer from floor to standing with assist of furniture.    Baseline -    Time 6    Period Weeks    Status New      PT LONG TERM GOAL #3   Title Improved 5x sit  to stand in <= 11 seconds demonstrating improved functional strength and decreasing fall risk.    Baseline -    Time 6    Period Weeks    Status New      PT LONG TERM GOAL #4   Title Patient able to climb stairs at home without difficulty using UE for balance only.    Time 6    Period Weeks    Status New      PT LONG TERM GOAL #5   Title Pt to demonstrate improved heel strike with gait bil.    Baseline -    Time 6    Period Weeks    Status New                   Plan - 05/31/21 1307     Clinical Impression Statement Patient was introduced to multiple new interventions for improved lower extremity strengthening with moderate fatigue and difficulty. He required moderate cuing throughout squatting to promote upright stance as he began to exhibit increase trunk flexion and decreased hip mobility as he became fatigued. Fatigue was his primary limitation today as he required multiple prolonged seated rest breaks throughout treatment. He reported feeling fatigued upon the conclusion of treatment. He continues to require skilled physical therapy to address his remaining impairments to maximize his functional mobility.    Personal Factors and Comorbidities Age    Comorbidities right shoulder pain, Arthrits, h/o piriformis syndrome and sciatica, left knee surgery, lumbar surgery,    Examination-Activity Limitations Transfers;Stairs    Stability/Clinical Decision Making Stable/Uncomplicated    Rehab Potential Excellent    PT Frequency 2x / week    PT Duration 6 weeks    PT Treatment/Interventions ADLs/Self Care Home Management;Gait training;Stair training;Functional mobility training;Therapeutic activities;Therapeutic exercise;Balance training;Neuromuscular re-education;Patient/family education;Energy conservation    PT Next Visit Plan assess floor to stand transfer and work on this, assess lunge and squat; Cardio for endurance, functional strengthening (squats, stairs, lunges), hip  extension strenth; add gastroc stretch to HEP    PT Home Exercise Plan [facility] Supine Bridge -  2 x daily - 7 x weekly - 1-3 sets - 10 reps  Forward Step Up with Unilateral Counter Support - 1 x daily - 7 x weekly - 2 sets - 10 reps  Sit to Stand - 2 x daily - 7 x weekly - 1-2 sets - 10 reps    Consulted and Agree with Plan of Care Patient             Patient will benefit from skilled therapeutic intervention in order to improve the following deficits and impairments:  Abnormal gait, Pain, Decreased strength, Impaired flexibility, Decreased endurance  Visit Diagnosis: Muscle weakness (generalized)     Problem List Patient Active Problem List   Diagnosis Date Noted   Fall 09/27/2019   Hypertension    Benign prostatic hyperplasia    Chronic back pain    Fall at home, initial encounter    Temporal bone fracture (HCC)    SDH (subdural hematoma)    Marijuana abuse    Alcohol abuse    MDD (major depressive disorder) 09/18/2013   Opiate dependence (HCC) 09/18/2013    Granville Lewis, PT 05/31/2021, 2:02 PM  [facility] [address] [place], [place], [address] Phone: [phone]   Fax:  [phone]  Name: David Carroll MRN: [id number] Date of Birth: [birth year]

## 2021-06-02 ENCOUNTER — Ambulatory Visit: Payer: Medicare Other | Admitting: Physical Therapy

## 2021-06-02 ENCOUNTER — Encounter: Payer: Self-pay | Admitting: Physical Therapy

## 2021-06-02 ENCOUNTER — Other Ambulatory Visit: Payer: Self-pay

## 2021-06-02 DIAGNOSIS — M6281 Muscle weakness (generalized): Secondary | ICD-10-CM | POA: Diagnosis not present

## 2021-06-02 NOTE — Therapy (Signed)
[facility] [address] [place], [place], [address] Phone: [phone]   Fax:  [phone]  Physical Therapy Treatment  Patient Details  Name: David Carroll MRN: [id number] Date of Birth: 05-May-1941 Referring Provider (PT): Delano Metz MD   Encounter Date: 06/02/2021   PT End of Session - 06/02/21 1325     Visit Number 3    Number of Visits 12    Date for PT Re-Evaluation 07/06/21    Authorization Type FOTO AT LEAST EVERY 5TH VISIT.  PROGRESS NOTE AT 10TH VISIT.  KX MODIFIER AFTER 15 VISITS.    PT Start Time 1313    PT Stop Time 1344    PT Time Calculation (min) 31 min    Activity Tolerance Patient tolerated treatment well    Behavior During Therapy [facility] for tasks assessed/performed             Past Medical History:  Diagnosis Date   Arthritis    KNEES   BPH (benign prostatic hypertrophy)    Complication of anesthesia SLOW RECOVERING FROM ANES. DRUGS   HX OPIATE ADDICTION-- PT ON SUBOXONE   Depression    Glaucoma    History of gastric ulcer    History of gastroesophageal reflux (GERD)    History of kidney stones    Hydronephrosis, left    Hypertension    Nephrolithiasis    Opiate addiction (HCC)    Urgency of urination     Past Surgical History:  Procedure Laterality Date   CATARACT EXTRACTION W/ INTRAOCULAR LENS  IMPLANT, BILATERAL     CYSTOSCOPY WITH BIOPSY Left 11/06/2012   Procedure: CYSTOSCOPY WITH BIOPSY;  Surgeon: Sebastian Ache, MD;  Location: [facility];  Service: Urology;  Laterality: Left;   EXTRACORPOREAL SHOCK WAVE LITHOTRIPSY Left 03-06-2011   HOLMIUM LASER APPLICATION Left 11/06/2012   Procedure: HOLMIUM LASER APPLICATION;  Surgeon: Sebastian Ache, MD;  Location: [facility];  Service: Urology;  Laterality: Left;   INGUINAL HERNIA REPAIR Bilateral 1970   RE-DO LEFT 1985   LUMBAR DISC SURGERY  09-24-2008   L3 -- L4   LUMBAR LAMINECTOMY  05/15/2019   with partial  facet joint removal   MEDIAL COMPARTMENT ARTHROPLASTY, LEFT KNEE  02-22-2010   REMOVAL BENIGN MASS LEFT LUNG  1977   VAGOTOMY  1986   ULCERS    There were no vitals filed for this visit.   Subjective Assessment - 06/02/21 1314     Subjective Reports feeling very tired today and can't get over the tired feeling.    Pertinent History Arthrits, h/o piriformis syndrome and sciatica, left knee surgery, lumbar surgery,    How long can you walk comfortably? Short community distances.    Patient Stated Goals Use right arm again without pain.    Currently in Pain? No/denies                [facility] PT Assessment - 06/02/21 0001       Assessment   Medical Diagnosis muscular deconditioning    Referring Provider (PT) Royston Sinner Corrington MD    Onset Date/Surgical Date 05/14/21    Prior Therapy y      Precautions   Precautions Fall    Precaution Comments repeated falls in the past                           [facility] Adult PT Treatment/Exercise - 06/02/21 0001       Knee/Hip  Exercises: Aerobic   Nustep L3 x15 min      Knee/Hip Exercises: Machines for Strengthening   Cybex Knee Extension 10# 3x10 reps    Cybex Knee Flexion 30 # 3x10 reps      Knee/Hip Exercises: Standing   Hip Flexion Stengthening;Both;15 reps;Knee bent;Limitations    Hip Flexion Limitations yellow theraband    Hip Abduction Stengthening;Both;15 reps;Knee straight;Limitations    Abduction Limitations yellow theraband      Shoulder Exercises: ROM/Strengthening   UBE (Upper Arm Bike) 90 RPM x4 min (forward, backward)                       PT Short Term Goals - 05/25/21 1215       PT SHORT TERM GOAL #1   Title Independent with an initial HEP    Time 3    Period Weeks    Status New    Target Date 06/15/21               PT Long Term Goals - 05/25/21 1215       PT LONG TERM GOAL #1   Title Independent with an advanced HEP.    Time 6    Period Weeks    Status New    Target  Date 07/06/21      PT LONG TERM GOAL #2   Title Patient able to transfer from floor to standing with assist of furniture.    Baseline -    Time 6    Period Weeks    Status New      PT LONG TERM GOAL #3   Title Improved 5x sit to stand in <= 11 seconds demonstrating improved functional strength and decreasing fall risk.    Baseline -    Time 6    Period Weeks    Status New      PT LONG TERM GOAL #4   Title Patient able to climb stairs at home without difficulty using UE for balance only.    Time 6    Period Weeks    Status New      PT LONG TERM GOAL #5   Title Pt to demonstrate improved heel strike with gait bil.    Baseline -    Time 6    Period Weeks    Status New                   Plan - 06/02/21 1518     Clinical Impression Statement Patient presented in clinic in considerable fatigue. Patient reports last fall that he was too weak to get up from the floor in both LEs and UEs. Patient limited with LE strengthening due to fatigue and requested more UE strengthening as well. Patient was fairly fatigued at end of treatment.    Personal Factors and Comorbidities Age    Comorbidities right shoulder pain, Arthrits, h/o piriformis syndrome and sciatica, left knee surgery, lumbar surgery,    Examination-Activity Limitations Transfers;Stairs    Examination-Participation Restrictions Other    Stability/Clinical Decision Making Stable/Uncomplicated    Rehab Potential Excellent    PT Frequency 2x / week    PT Duration 6 weeks    PT Treatment/Interventions ADLs/Self Care Home Management;Gait training;Stair training;Functional mobility training;Therapeutic activities;Therapeutic exercise;Balance training;Neuromuscular re-education;Patient/family education;Energy conservation    PT Next Visit Plan assess floor to stand transfer and work on this, assess lunge and squat; Cardio for endurance, functional strengthening (squats, stairs, lunges), hip extension strenth; add  gastroc  stretch to HEP    PT Home Exercise Plan [id number] Supine Bridge - 2 x daily - 7 x weekly - 1-3 sets - 10 reps  Forward Step Up with Unilateral Counter Support - 1 x daily - 7 x weekly - 2 sets - 10 reps  Sit to Stand - 2 x daily - 7 x weekly - 1-2 sets - 10 reps    Consulted and Agree with Plan of Care Patient             Patient will benefit from skilled therapeutic intervention in order to improve the following deficits and impairments:  Abnormal gait, Pain, Decreased strength, Impaired flexibility, Decreased endurance  Visit Diagnosis: Muscle weakness (generalized)     Problem List Patient Active Problem List   Diagnosis Date Noted   Fall 09/27/2019   Hypertension    Benign prostatic hyperplasia    Chronic back pain    Fall at home, initial encounter    Temporal bone fracture (HCC)    SDH (subdural hematoma)    Marijuana abuse    Alcohol abuse    MDD (major depressive disorder) 09/18/2013   Opiate dependence (HCC) 09/18/2013    David Carroll, PTA 06/02/2021, 3:24 PM  [facility] [address] [place], [place], [address] Phone: [phone]   Fax:  [phone]  Name: David Carroll MRN: [id number] Date of Birth: Oct 18, 1940

## 2021-06-08 ENCOUNTER — Ambulatory Visit: Payer: Medicare Other

## 2021-06-15 ENCOUNTER — Ambulatory Visit: Payer: Medicare Other

## 2021-06-23 ENCOUNTER — Ambulatory Visit: Payer: Medicare Other | Attending: Sports Medicine | Admitting: Physical Therapy

## 2021-06-23 ENCOUNTER — Other Ambulatory Visit: Payer: Self-pay

## 2021-06-23 ENCOUNTER — Encounter: Payer: Self-pay | Admitting: Physical Therapy

## 2021-06-23 DIAGNOSIS — M25611 Stiffness of right shoulder, not elsewhere classified: Secondary | ICD-10-CM | POA: Insufficient documentation

## 2021-06-23 DIAGNOSIS — M25511 Pain in right shoulder: Secondary | ICD-10-CM | POA: Diagnosis present

## 2021-06-23 DIAGNOSIS — M6281 Muscle weakness (generalized): Secondary | ICD-10-CM | POA: Insufficient documentation

## 2021-06-23 NOTE — Therapy (Signed)
[facility] [place], [place], [address] Phone: [phone]   Fax:  [phone]  Physical Therapy Treatment  Patient Details  Name: David Carroll MRN: [id number] Date of Birth: 1940-09-01 Referring Provider (PT): Ledora Bottcher MD   Encounter Date: 06/23/2021   PT End of Session - 06/23/21 1305     Visit Number 4    Number of Visits 12    Date for PT Re-Evaluation 07/06/21    Authorization Type FOTO AT LEAST EVERY 5TH VISIT.  PROGRESS NOTE AT 10TH VISIT.  KX MODIFIER AFTER 15 VISITS.    PT Start Time 1302    PT Stop Time 1334    PT Time Calculation (min) 32 min    Activity Tolerance Patient limited by fatigue    Behavior During Therapy WFL for tasks assessed/performed             Past Medical History:  Diagnosis Date   Arthritis    KNEES   BPH (benign prostatic hypertrophy)    Complication of anesthesia SLOW RECOVERING FROM ANES. DRUGS   HX OPIATE ADDICTION-- PT ON SUBOXONE   Depression    Glaucoma    History of gastric ulcer    History of gastroesophageal reflux (GERD)    History of kidney stones    Hydronephrosis, left    Hypertension    Nephrolithiasis    Opiate addiction ([place])    Urgency of urination     Past Surgical History:  Procedure Laterality Date   CATARACT EXTRACTION W/ INTRAOCULAR LENS  IMPLANT, BILATERAL     CYSTOSCOPY WITH BIOPSY Left 11/06/2012   Procedure: CYSTOSCOPY WITH BIOPSY;  Surgeon: Alexis Frock, MD;  Location: [facility];  Service: Urology;  Laterality: Left;   EXTRACORPOREAL SHOCK WAVE LITHOTRIPSY Left 03-06-2011   HOLMIUM LASER APPLICATION Left [id number]   Procedure: HOLMIUM LASER APPLICATION;  Surgeon: Alexis Frock, MD;  Location: [facility];  Service: Urology;  Laterality: Left;   INGUINAL HERNIA REPAIR Bilateral 1970   RE-DO [place] SURGERY  09-24-2008   L3 -- L4   LUMBAR LAMINECTOMY  05/15/2019   with partial facet  joint removal   MEDIAL COMPARTMENT ARTHROPLASTY, LEFT KNEE  02-22-2010   REMOVAL BENIGN MASS LEFT LUNG  1977   VAGOTOMY  1986   ULCERS    There were no vitals filed for this visit.   Subjective Assessment - 06/23/21 1300     Subjective Reports that he feels generally sore everywhere. Wants to see his PCP regarding his circulation. Has been going to a local chair yoga class.    Pertinent History Arthrits, h/o piriformis syndrome and sciatica, left knee surgery, lumbar surgery,    How long can you walk comfortably? Short community distances.    Patient Stated Goals Use right arm again without pain.    Currently in Pain? No/denies                [facility] PT Assessment - 06/23/21 0001       Assessment   Medical Diagnosis muscular deconditioning    Referring Provider (PT) Talbert Forest Corrington MD    Onset Date/Surgical Date 05/14/21    Prior Therapy y      Precautions   Precautions Fall    Precaution Comments repeated falls in the past                           [facility] Adult  PT Treatment/Exercise - 06/23/21 0001       Knee/Hip Exercises: Aerobic   Nustep L2 x10 min      Knee/Hip Exercises: Machines for Strengthening   Cybex Knee Extension 10# 2x10 reps; reported fatigue    Cybex Knee Flexion 30 # 3x10 reps      Knee/Hip Exercises: Standing   Forward Step Up Both;15 reps;Hand Hold: 2;Step Height: 6"      Knee/Hip Exercises: Seated   Sit to Sand 10 reps;with UE support      Shoulder Exercises: ROM/Strengthening   UBE (Upper Arm Bike) 120 RPM x4 min (forward, backward)                       PT Short Term Goals - 05/25/21 1215       PT SHORT TERM GOAL #1   Title Independent with an initial HEP    Time 3    Period Weeks    Status New    Target Date 06/15/21               PT Long Term Goals - 05/25/21 1215       PT LONG TERM GOAL #1   Title Independent with an advanced HEP.    Time 6    Period Weeks    Status New    Target  Date 07/06/21      PT LONG TERM GOAL #2   Title Patient able to transfer from floor to standing with assist of furniture.    Baseline -    Time 6    Period Weeks    Status New      PT LONG TERM GOAL #3   Title Improved 5x sit to stand in <= 11 seconds demonstrating improved functional strength and decreasing fall risk.    Baseline -    Time 6    Period Weeks    Status New      PT LONG TERM GOAL #4   Title Patient able to climb stairs at home without difficulty using UE for balance only.    Time 6    Period Weeks    Status New      PT LONG TERM GOAL #5   Title Pt to demonstrate improved heel strike with gait bil.    Baseline -    Time 6    Period Weeks    Status New                   Plan - 06/23/21 1336     Clinical Impression Statement Patient presented in clinic with reports of generalized soreness and fatigue that has been bad over the last few weeks according to the patient. He is to see his PCP next week to reference his concerns. Patient required more rest breaks between sets and between exercises as well due to fatigue. Patient denies any new falls recently but has been going to a local chair yoga class twice a week.    Personal Factors and Comorbidities Age    Comorbidities right shoulder pain, Arthrits, h/o piriformis syndrome and sciatica, left knee surgery, lumbar surgery,    Examination-Activity Limitations Transfers;Stairs    Examination-Participation Restrictions Other    Stability/Clinical Decision Making Stable/Uncomplicated    Rehab Potential Excellent    PT Frequency 2x / week    PT Duration 6 weeks    PT Treatment/Interventions ADLs/Self Care Home Management;Gait training;Stair training;Functional mobility training;Therapeutic activities;Therapeutic exercise;Balance training;Neuromuscular re-education;Patient/family education;Energy conservation  PT Next Visit Plan assess floor to stand transfer and work on this, assess lunge and squat; Cardio  for endurance, functional strengthening (squats, stairs, lunges), hip extension strenth; add gastroc stretch to HEP    PT Home Exercise Plan [id number] Supine Bridge - 2 x daily - 7 x weekly - 1-3 sets - 10 reps  Forward Step Up with Unilateral Counter Support - 1 x daily - 7 x weekly - 2 sets - 10 reps  Sit to Stand - 2 x daily - 7 x weekly - 1-2 sets - 10 reps    Consulted and Agree with Plan of Care Patient             Patient will benefit from skilled therapeutic intervention in order to improve the following deficits and impairments:  Abnormal gait, Pain, Decreased strength, Impaired flexibility, Decreased endurance  Visit Diagnosis: Muscle weakness (generalized)     Problem List Patient Active Problem List   Diagnosis Date Noted   Fall 09/27/2019   Hypertension    Benign prostatic hyperplasia    Chronic back pain    Fall at home, initial encounter    Temporal bone fracture (HCC)    SDH (subdural hematoma)    Marijuana abuse    Alcohol abuse    MDD (major depressive disorder) 09/18/2013   Opiate dependence ([place]) 09/18/2013    Standley Brooking, PTA 06/23/2021, 1:40 PM  [facility] [place], [place], [address] Phone: [phone]   Fax:  [phone]  Name: Harmony Rossner MRN: [id number] Date of Birth: [birth year]

## 2021-06-28 ENCOUNTER — Encounter: Payer: Medicare Other | Admitting: Physical Therapy

## 2021-06-30 ENCOUNTER — Ambulatory Visit: Payer: Medicare Other | Admitting: *Deleted

## 2021-06-30 ENCOUNTER — Other Ambulatory Visit: Payer: Self-pay

## 2021-06-30 DIAGNOSIS — M6281 Muscle weakness (generalized): Secondary | ICD-10-CM | POA: Diagnosis not present

## 2021-06-30 IMAGING — CT CT HEAD W/O CM
4 series · 15 of 47 positions shown, 17 images · non-contrast
Comparison: 09/27/2019

CLINICAL DATA: Follow-up subdural hemorrhage.

EXAM:
CT HEAD WITHOUT CONTRAST
TECHNIQUE: Contiguous axial images were obtained from the base of the skull
through the vertex without intravenous contrast.

[Series 3: head without · axial · non-contrast · 0.43mm/px · z∈[-175,-55]mm · 7 of 33 slices shown, 9 images]
[im 5/33  brain]
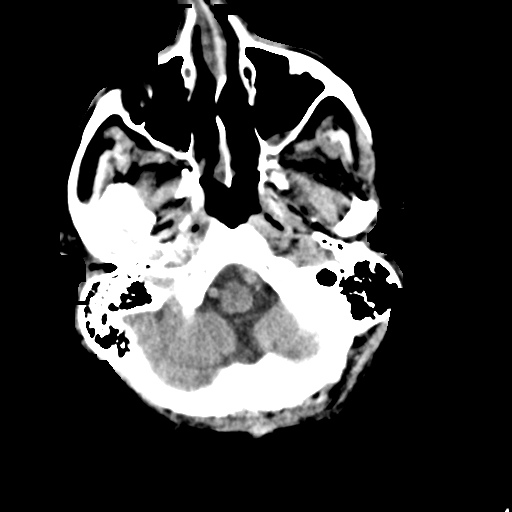
[im 5/33  bone]
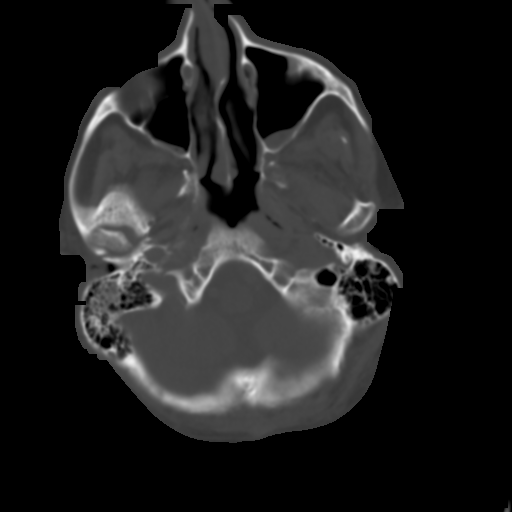
[im 9/33  brain]
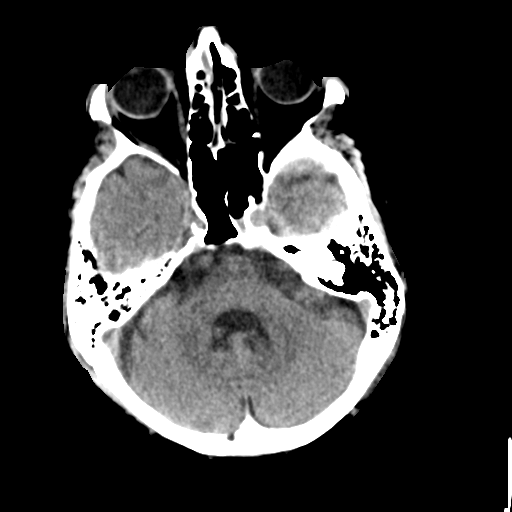
[im 13/33  brain]
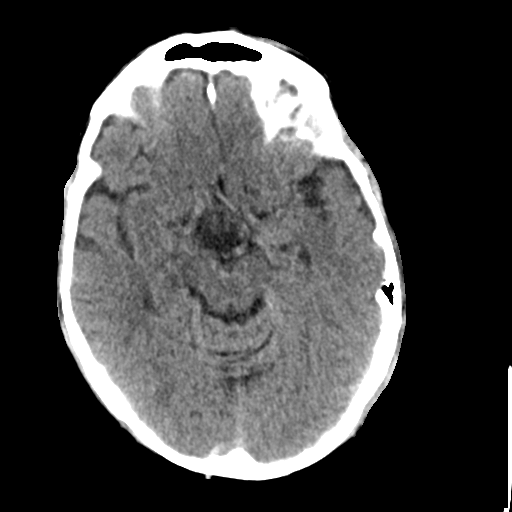
[im 17/33  brain]
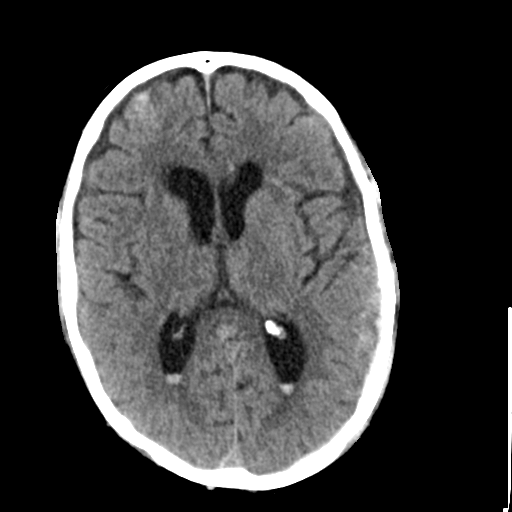
[im 21/33  brain]
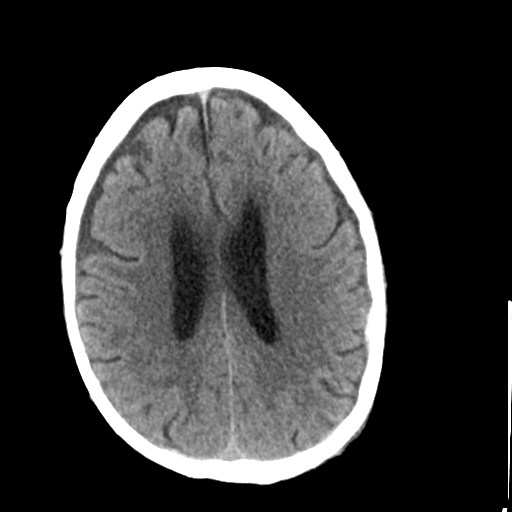
[im 21/33  bone]
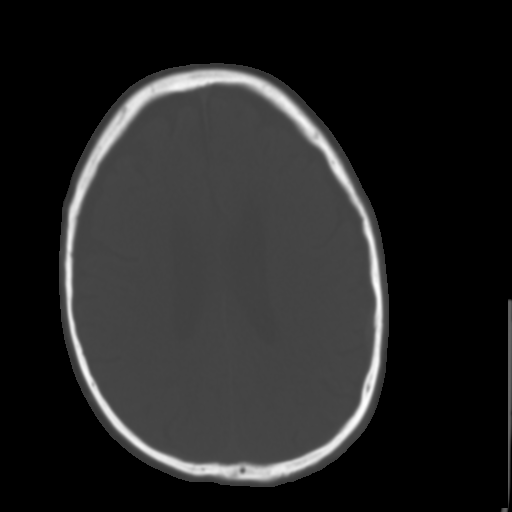
[im 25/33  brain]
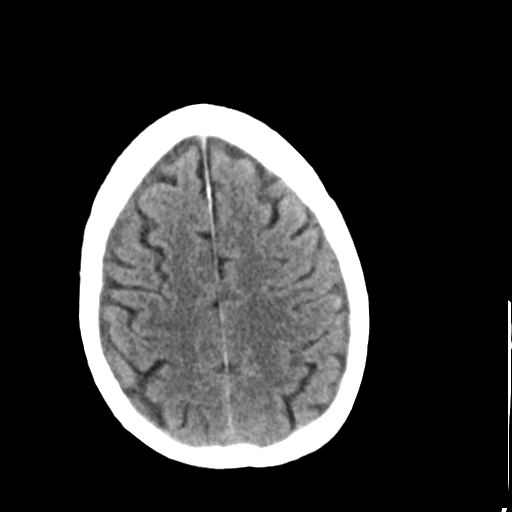
[im 29/33  brain]
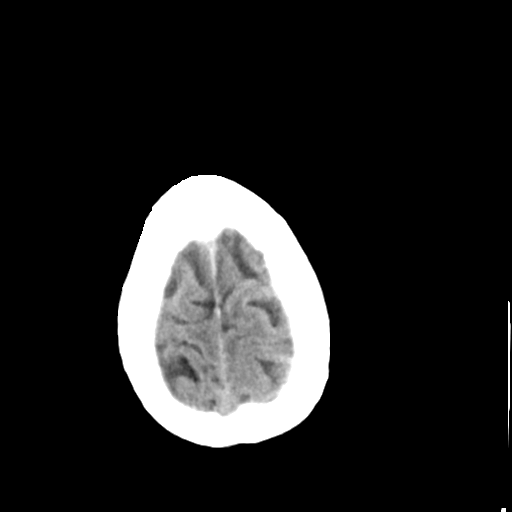

[Series 4: head bone · axial · 0.43mm/px · z∈[-179,-163]mm · 2 of 83 slices shown]
[im 9/83  bone]
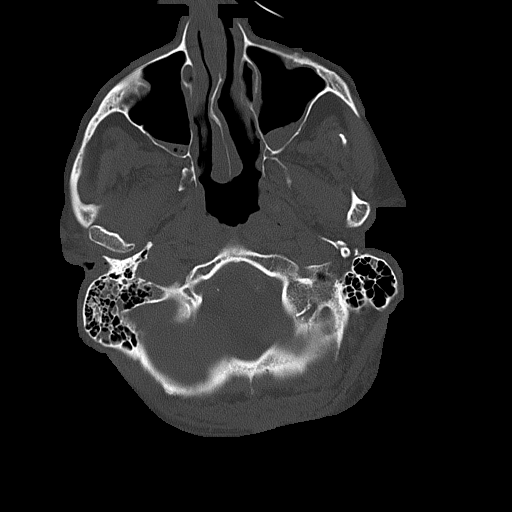
[im 17/83  bone]
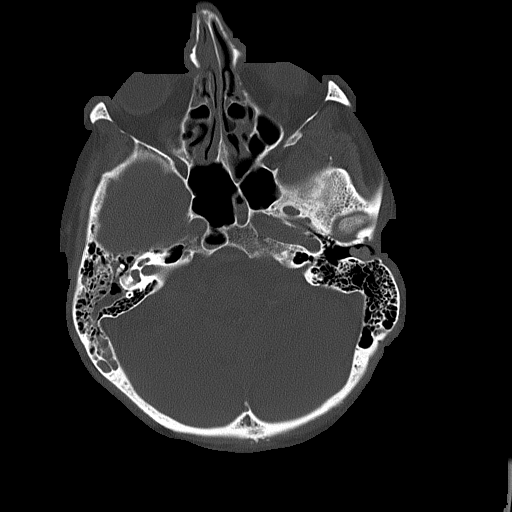

[Series 5: head without cor · coronal · non-contrast · 0.34mm/px · 3 of 69 slices shown]
[im 23/69  brain]
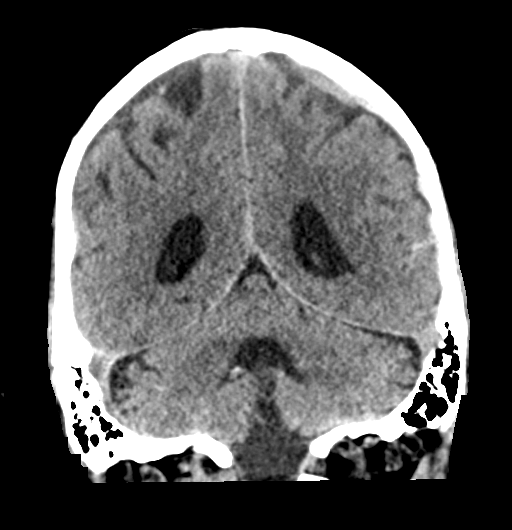
[im 31/69  brain]
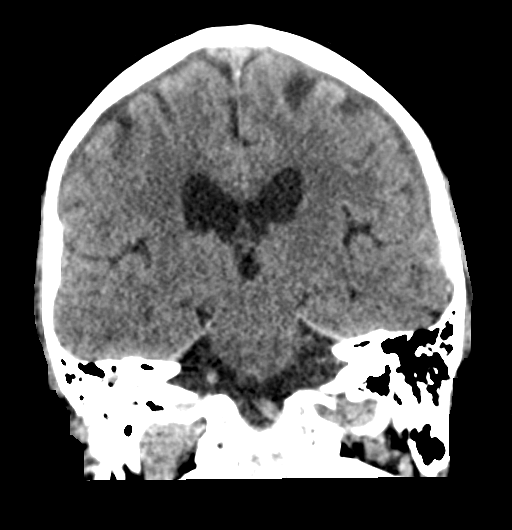
[im 38/69  brain]
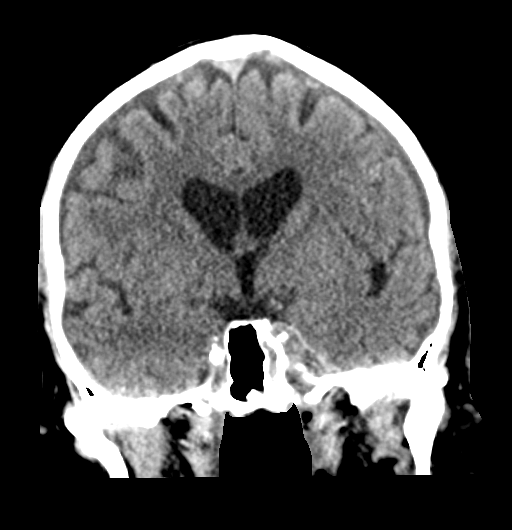

[Series 6: head without sag · sagittal · non-contrast · 0.33mm/px · 3 of 55 slices shown]
[im 19/55  brain]
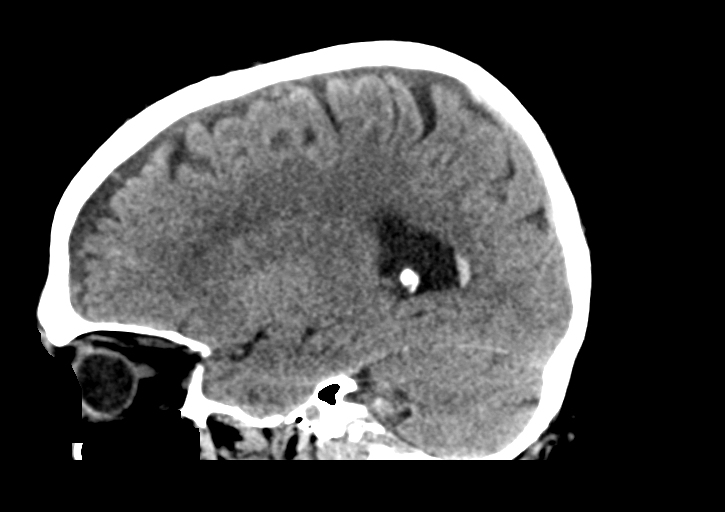
[im 28/55  brain]
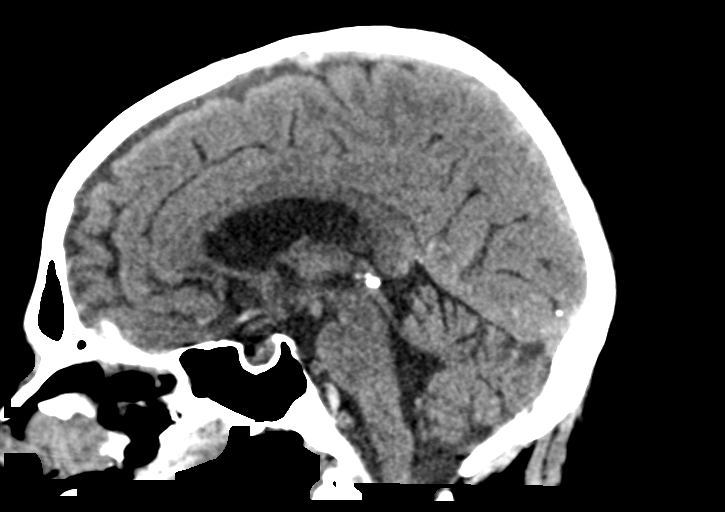
[im 37/55  brain]
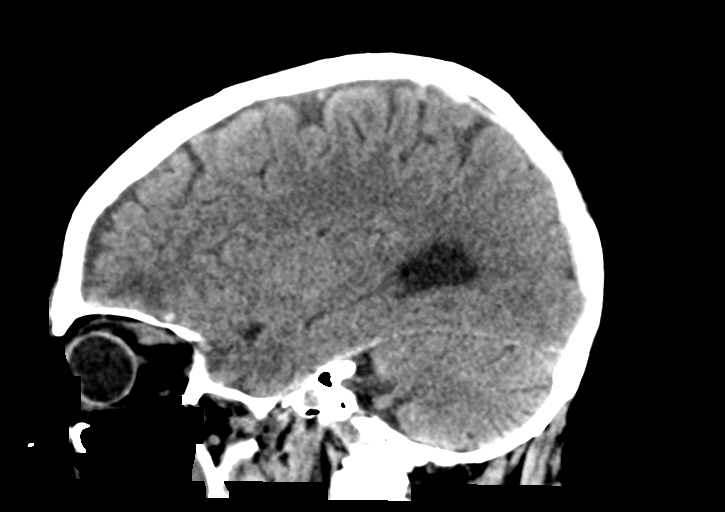

[15 of 47 positions shown; findings below may reference images not displayed]

FINDINGS: Brain: A small subdural hematoma over the left cerebral convexity
measures up to 5 mm in thickness, not significantly changed and
without associated mass effect. Scattered small volume subarachnoid
hemorrhage over both cerebral convexities is unchanged. There is a 3
mm focus of hemorrhage in the left genu of the corpus callosum
suggesting shear injury, more conspicuous than on the prior study
but not new. There is minimally increased blood in the occipital
horns of the lateral ventricles which may reflect redistribution of
subarachnoid hemorrhage.

Mild edema associated with contusions in the anterior left frontal
lobe and right frontal operculum has increased. There is also likely
a small contusion in the anterior left temporal lobe. There is no
midline shift. The ventricles are unchanged in size. No acute large
territory infarct is evident. Mild cerebral atrophy and mild chronic
small vessel ischemic white matter disease are again noted.

Vascular: Calcified atherosclerosis at the skull base.

Skull: Nondisplaced right-sided skull fracture with involvement of
the temporal bone as previously described.

Sinuses/Orbits: Mild mucosal thickening and secretions in the
paranasal sinuses. Increased, moderate right mastoid air cell and
right middle ear opacification likely reflecting blood. Cerumen or
other material in both external auditory canals. Bilateral cataract
extraction.

Other: Mild posterior scalp soft tissue swelling.
IMPRESSION: 1. Increased edema associated with small contusions in the left
temporal and bilateral frontal lobes.
2. Unchanged small left-sided subdural hematoma without mass effect.
3. Unchanged small volume subarachnoid hemorrhage.

## 2021-06-30 NOTE — Therapy (Signed)
[facility] [place], [place], [address] Phone: [phone]   Fax:  [phone]  Physical Therapy Treatment  Patient Details  Name: David Carroll MRN: [id number] Date of Birth: 09-01-1940 Referring Provider (PT): Ledora Bottcher MD   Encounter Date: 06/30/2021   PT End of Session - 06/30/21 1309     Visit Number 5    Number of Visits 12    Authorization Type FOTO AT [place].  PROGRESS NOTE AT 10TH VISIT.  KX MODIFIER AFTER 15 VISITS.    PT Start Time 1300    PT Stop Time 1347    PT Time Calculation (min) 47 min             Past Medical History:  Diagnosis Date   Arthritis    KNEES   BPH (benign prostatic hypertrophy)    Complication of anesthesia SLOW RECOVERING FROM ANES. DRUGS   HX OPIATE ADDICTION-- PT ON SUBOXONE   Depression    Glaucoma    History of gastric ulcer    History of gastroesophageal reflux (GERD)    History of kidney stones    Hydronephrosis, left    Hypertension    Nephrolithiasis    Opiate addiction ([place])    Urgency of urination     Past Surgical History:  Procedure Laterality Date   CATARACT EXTRACTION W/ INTRAOCULAR LENS  IMPLANT, BILATERAL     CYSTOSCOPY WITH BIOPSY Left 11/06/2012   Procedure: CYSTOSCOPY WITH BIOPSY;  Surgeon: Alexis Frock, MD;  Location: [facility];  Service: Urology;  Laterality: Left;   EXTRACORPOREAL SHOCK WAVE LITHOTRIPSY Left 03-06-2011   HOLMIUM LASER APPLICATION Left [id number]   Procedure: HOLMIUM LASER APPLICATION;  Surgeon: Alexis Frock, MD;  Location: [facility];  Service: Urology;  Laterality: Left;   INGUINAL HERNIA REPAIR Bilateral 1970   RE-DO [place] SURGERY  09-24-2008   L3 -- L4   LUMBAR LAMINECTOMY  05/15/2019   with partial facet joint removal   MEDIAL COMPARTMENT ARTHROPLASTY, LEFT KNEE  02-22-2010   REMOVAL BENIGN MASS LEFT LUNG  1977   VAGOTOMY  1986   ULCERS    There  were no vitals filed for this visit.   Subjective Assessment - 06/30/21 1308     Subjective Has been going to a local chair yoga class. Did okay after last Rx.    Pertinent History Arthrits, h/o piriformis syndrome and sciatica, left knee surgery, lumbar surgery,    How long can you walk comfortably? Short community distances.    Patient Stated Goals Use right arm again without pain.    Currently in Pain? No/denies                               [facility] Adult PT Treatment/Exercise - 06/30/21 0001       Knee/Hip Exercises: Aerobic   Nustep L2 x10 min      Knee/Hip Exercises: Machines for Strengthening   Cybex Knee Extension 10# 2x10 reps; reported fatigue    Cybex Knee Flexion 30 # 3x15 reps      Knee/Hip Exercises: Seated   Sit to Sand 10 reps;with UE support   focused on glute activation                Balance Exercises - 06/30/21 0001       Balance Exercises: Standing   Standing Eyes Opened Narrow base  of support (BOS);Foam/compliant surface;Cognitive challenge   point to numbers on wall, CGA all times   Tandem Stance Eyes open   one step holds   Rockerboard Anterior/posterior;Intermittent UE support;UE support   CGA at all times   Balance Beam side stepping x 4 mins total 1 and 2 hand holds                  PT Short Term Goals - 05/25/21 1215       PT SHORT TERM GOAL #1   Title Independent with an initial HEP    Time 3    Period Weeks    Status New    Target Date 06/15/21               PT Long Term Goals - 05/25/21 1215       PT LONG TERM GOAL #1   Title Independent with an advanced HEP.    Time 6    Period Weeks    Status New    Target Date 07/06/21      PT LONG TERM GOAL #2   Title Patient able to transfer from floor to standing with assist of furniture.    Baseline -    Time 6    Period Weeks    Status New      PT LONG TERM GOAL #3   Title Improved 5x sit to stand in <= 11 seconds demonstrating improved  functional strength and decreasing fall risk.    Baseline -    Time 6    Period Weeks    Status New      PT LONG TERM GOAL #4   Title Patient able to climb stairs at home without difficulty using UE for balance only.    Time 6    Period Weeks    Status New      PT LONG TERM GOAL #5   Title Pt to demonstrate improved heel strike with gait bil.    Baseline -    Time 6    Period Weeks    Status New                   Plan - 06/30/21 1310     Clinical Impression Statement Pt arrived today  doing some better and was able to continue with ex progression. Rx focused more on balance and proprioception act's/ exs    Personal Factors and Comorbidities Age    Comorbidities right shoulder pain, Arthrits, h/o piriformis syndrome and sciatica, left knee surgery, lumbar surgery,    Examination-Activity Limitations Transfers;Stairs    Rehab Potential Excellent    PT Frequency 2x / week    PT Duration 6 weeks    PT Treatment/Interventions ADLs/Self Care Home Management;Gait training;Stair training;Functional mobility training;Therapeutic activities;Therapeutic exercise;Balance training;Neuromuscular re-education;Patient/family education;Energy conservation    Consulted and Agree with Plan of Care Patient             Patient will benefit from skilled therapeutic intervention in order to improve the following deficits and impairments:  Abnormal gait, Pain, Decreased strength, Impaired flexibility, Decreased endurance  Visit Diagnosis: Muscle weakness (generalized)     Problem List Patient Active Problem List   Diagnosis Date Noted   Fall 09/27/2019   Hypertension    Benign prostatic hyperplasia    Chronic back pain    Fall at home, initial encounter    Temporal bone fracture (HCC)    SDH (subdural hematoma)    Marijuana abuse  Alcohol abuse    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

## 2021-07-05 ENCOUNTER — Ambulatory Visit: Payer: Medicare Other | Admitting: Physical Therapy

## 2021-07-05 ENCOUNTER — Encounter: Payer: Self-pay | Admitting: Physical Therapy

## 2021-07-05 ENCOUNTER — Other Ambulatory Visit: Payer: Self-pay

## 2021-07-05 DIAGNOSIS — M6281 Muscle weakness (generalized): Secondary | ICD-10-CM

## 2021-07-05 NOTE — Therapy (Signed)
[facility] [address] [place], [place], [address] Phone: [phone]   Fax:  [phone]  Physical Therapy Treatment  Patient Details  Name: David Carroll MRN: [id number] Date of Birth: 10-12-1940 Referring Provider (PT): Delano Metz MD   Encounter Date: 07/05/2021   PT End of Session - 07/05/21 1323     Visit Number 6    Number of Visits 12    Date for PT Re-Evaluation 07/06/21    Authorization Type FOTO AT LEAST EVERY 5TH VISIT.  PROGRESS NOTE AT 10TH VISIT.  KX MODIFIER AFTER 15 VISITS.    PT Start Time 1302    PT Stop Time 1338    PT Time Calculation (min) 36 min    Activity Tolerance Patient limited by fatigue    Behavior During Therapy WFL for tasks assessed/performed             Past Medical History:  Diagnosis Date   Arthritis    KNEES   BPH (benign prostatic hypertrophy)    Complication of anesthesia SLOW RECOVERING FROM ANES. DRUGS   HX OPIATE ADDICTION-- PT ON SUBOXONE   Depression    Glaucoma    History of gastric ulcer    History of gastroesophageal reflux (GERD)    History of kidney stones    Hydronephrosis, left    Hypertension    Nephrolithiasis    Opiate addiction (HCC)    Urgency of urination     Past Surgical History:  Procedure Laterality Date   CATARACT EXTRACTION W/ INTRAOCULAR LENS  IMPLANT, BILATERAL     CYSTOSCOPY WITH BIOPSY Left 11/06/2012   Procedure: CYSTOSCOPY WITH BIOPSY;  Surgeon: Sebastian Ache, MD;  Location: [facility];  Service: Urology;  Laterality: Left;   EXTRACORPOREAL SHOCK WAVE LITHOTRIPSY Left 03-06-2011   HOLMIUM LASER APPLICATION Left 11/06/2012   Procedure: HOLMIUM LASER APPLICATION;  Surgeon: Sebastian Ache, MD;  Location: [facility];  Service: Urology;  Laterality: Left;   INGUINAL HERNIA REPAIR Bilateral 1970   RE-DO LEFT 1985   LUMBAR DISC SURGERY  09-24-2008   L3 -- L4   LUMBAR LAMINECTOMY  05/15/2019   with partial facet  joint removal   MEDIAL COMPARTMENT ARTHROPLASTY, LEFT KNEE  02-22-2010   REMOVAL BENIGN MASS LEFT LUNG  1977   VAGOTOMY  1986   ULCERS    There were no vitals filed for this visit.   Subjective Assessment - 07/05/21 1318     Subjective Went to PCP and had blood panels taken but all checked out well.    Pertinent History Arthrits, h/o piriformis syndrome and sciatica, left knee surgery, lumbar surgery,    How long can you walk comfortably? Short community distances.    Patient Stated Goals Use right arm again without pain.    Currently in Pain? No/denies                [facility] PT Assessment - 07/05/21 0001       Assessment   Medical Diagnosis muscular deconditioning    Referring Provider (PT) Royston Sinner Corrington MD    Onset Date/Surgical Date 05/14/21    Prior Therapy y      Precautions   Precautions Fall    Precaution Comments repeated falls in the past                           [facility] Adult PT Treatment/Exercise - 07/05/21 0001  Knee/Hip Exercises: Aerobic   Nustep L2 x10 min      Knee/Hip Exercises: Machines for Strengthening   Cybex Knee Extension 10# 3x10 reps; reported fatigue    Cybex Knee Flexion 30 # 3x15 reps      Knee/Hip Exercises: Standing   Heel Raises Both;20 reps    Heel Raises Limitations B toe raise x20 rpes    Hip Abduction AROM;Both;2 sets;10 reps;Knee straight    Lateral Step Up Both;15 reps;Hand Hold: 2;Step Height: 6"    Forward Step Up Both;15 reps;Hand Hold: 2;Step Height: 6"      Knee/Hip Exercises: Seated   Clamshell with TheraBand Red   x20 reps   Marching Strengthening;Both;20 reps;Limitations    Marching Limitations green theraband    Sit to Sand 10 reps;with UE support                       PT Short Term Goals - 05/25/21 1215       PT SHORT TERM GOAL #1   Title Independent with an initial HEP    Time 3    Period Weeks    Status New    Target Date 06/15/21               PT Long Term  Goals - 05/25/21 1215       PT LONG TERM GOAL #1   Title Independent with an advanced HEP.    Time 6    Period Weeks    Status New    Target Date 07/06/21      PT LONG TERM GOAL #2   Title Patient able to transfer from floor to standing with assist of furniture.    Baseline -    Time 6    Period Weeks    Status New      PT LONG TERM GOAL #3   Title Improved 5x sit to stand in <= 11 seconds demonstrating improved functional strength and decreasing fall risk.    Baseline -    Time 6    Period Weeks    Status New      PT LONG TERM GOAL #4   Title Patient able to climb stairs at home without difficulty using UE for balance only.    Time 6    Period Weeks    Status New      PT LONG TERM GOAL #5   Title Pt to demonstrate improved heel strike with gait bil.    Baseline -    Time 6    Period Weeks    Status New                   Plan - 07/05/21 1349     Clinical Impression Statement Patient presented in clinic with no new falls and states that MD could not find anything in blood tests as to why he is so fatigued. Patient progressed through more therex today as he could tolerate. Patient states that his feet give him a lot of trouble which he states that he thinks he has deficient blood circulation. Patient relies highly on stable surface with eccentric control during sit <> stands.    Personal Factors and Comorbidities Age    Comorbidities right shoulder pain, Arthrits, h/o piriformis syndrome and sciatica, left knee surgery, lumbar surgery,    Examination-Activity Limitations Transfers;Stairs    Examination-Participation Restrictions Other    Stability/Clinical Decision Making Stable/Uncomplicated    Rehab Potential Excellent  PT Frequency 2x / week    PT Duration 6 weeks    PT Treatment/Interventions ADLs/Self Care Home Management;Gait training;Stair training;Functional mobility training;Therapeutic activities;Therapeutic exercise;Balance training;Neuromuscular  re-education;Patient/family education;Energy conservation    PT Next Visit Plan assess floor to stand transfer and work on this, assess lunge and squat; Cardio for endurance, functional strengthening (squats, stairs, lunges), hip extension strenth; add gastroc stretch to HEP    PT Home Exercise Plan [id number] Supine Bridge - 2 x daily - 7 x weekly - 1-3 sets - 10 reps  Forward Step Up with Unilateral Counter Support - 1 x daily - 7 x weekly - 2 sets - 10 reps  Sit to Stand - 2 x daily - 7 x weekly - 1-2 sets - 10 reps    Consulted and Agree with Plan of Care Patient             Patient will benefit from skilled therapeutic intervention in order to improve the following deficits and impairments:  Abnormal gait, Pain, Decreased strength, Impaired flexibility, Decreased endurance  Visit Diagnosis: Muscle weakness (generalized)     Problem List Patient Active Problem List   Diagnosis Date Noted   Fall 09/27/2019   Hypertension    Benign prostatic hyperplasia    Chronic back pain    Fall at home, initial encounter    Temporal bone fracture (HCC)    SDH (subdural hematoma)    Marijuana abuse    Alcohol abuse    MDD (major depressive disorder) 09/18/2013   Opiate dependence (HCC) 09/18/2013    Marvell Fuller, PTA 07/05/2021, 1:58 PM  [facility] [address] [place], [place], [address] Phone: [phone]   Fax:  [phone]  Name: David Carroll MRN: [id number] Date of Birth: [birth year]

## 2021-07-12 ENCOUNTER — Ambulatory Visit: Payer: Medicare Other | Admitting: Physical Therapy

## 2021-07-12 ENCOUNTER — Other Ambulatory Visit: Payer: Self-pay

## 2021-07-12 DIAGNOSIS — M25611 Stiffness of right shoulder, not elsewhere classified: Secondary | ICD-10-CM

## 2021-07-12 DIAGNOSIS — M6281 Muscle weakness (generalized): Secondary | ICD-10-CM

## 2021-07-12 DIAGNOSIS — M25511 Pain in right shoulder: Secondary | ICD-10-CM

## 2021-07-12 NOTE — Therapy (Addendum)
[facility] [place], [place], [address] Phone: [phone]   Fax:  [phone]  Physical Therapy Treatment  Patient Details  Name: David Carroll MRN: [id number] Date of Birth: May 12, 1941 Referring Provider (PT): Ledora Bottcher MD   Encounter Date: 07/12/2021   PT End of Session - 07/12/21 1327     Visit Number 7    Number of Visits 12    Date for PT Re-Evaluation 07/06/21    Authorization Type FOTO AT LEAST EVERY 5TH VISIT.  PROGRESS NOTE AT 10TH VISIT.  KX MODIFIER AFTER 15 VISITS.    PT Start Time 1317    PT Stop Time 1345    PT Time Calculation (min) 28 min    Activity Tolerance Patient limited by fatigue    Behavior During Therapy WFL for tasks assessed/performed             Past Medical History:  Diagnosis Date   Arthritis    KNEES   BPH (benign prostatic hypertrophy)    Complication of anesthesia SLOW RECOVERING FROM ANES. DRUGS   HX OPIATE ADDICTION-- PT ON SUBOXONE   Depression    Glaucoma    History of gastric ulcer    History of gastroesophageal reflux (GERD)    History of kidney stones    Hydronephrosis, left    Hypertension    Nephrolithiasis    Opiate addiction ([place])    Urgency of urination     Past Surgical History:  Procedure Laterality Date   CATARACT EXTRACTION W/ INTRAOCULAR LENS  IMPLANT, BILATERAL     CYSTOSCOPY WITH BIOPSY Left 11/06/2012   Procedure: CYSTOSCOPY WITH BIOPSY;  Surgeon: Alexis Frock, MD;  Location: [facility];  Service: Urology;  Laterality: Left;   EXTRACORPOREAL SHOCK WAVE LITHOTRIPSY Left 03-06-2011   HOLMIUM LASER APPLICATION Left [id number]   Procedure: HOLMIUM LASER APPLICATION;  Surgeon: Alexis Frock, MD;  Location: [facility];  Service: Urology;  Laterality: Left;   INGUINAL HERNIA REPAIR Bilateral 1970   RE-DO [place] SURGERY  09-24-2008   L3 -- L4   LUMBAR LAMINECTOMY  05/15/2019   with partial facet  joint removal   MEDIAL COMPARTMENT ARTHROPLASTY, LEFT KNEE  02-22-2010   REMOVAL BENIGN MASS LEFT LUNG  1977   VAGOTOMY  1986   ULCERS    There were no vitals filed for this visit.   Subjective Assessment - 07/12/21 1325     Subjective Was working on the computer today but states that his whole body hurts. He is seeing an orthopedic regarding his shoulder tomorrow.    Pertinent History Arthrits, h/o piriformis syndrome and sciatica, left knee surgery, lumbar surgery,    How long can you walk comfortably? Short community distances.    Patient Stated Goals Use right arm again without pain.    Currently in Pain? Yes    Pain Score --   No pain score provided by patient   Pain Location Generalized    Pain Descriptors / Indicators Discomfort    Pain Type Acute pain    Pain Onset More than a month ago    Pain Frequency Intermittent                [facility] PT Assessment - 07/12/21 0001       Assessment   Medical Diagnosis muscular deconditioning    Referring Provider (PT) Talbert Forest Corrington MD    Onset Date/Surgical Date 05/14/21    Prior Therapy y  Precautions   Precautions Fall    Precaution Comments repeated falls in the past                           [facility] Adult PT Treatment/Exercise - 07/12/21 0001       Knee/Hip Exercises: Aerobic   Nustep L3 x10 mn      Knee/Hip Exercises: Machines for Strengthening   Cybex Knee Extension 10# 3x10 reps; reported fatigue    Cybex Knee Flexion 30 # 3x10 reps      Knee/Hip Exercises: Standing   Heel Raises Both;20 reps    Heel Raises Limitations B toe raise x20 rpes    Hip Flexion AROM;Both;20 reps;Knee bent    Hip Abduction AROM;Both;2 sets;10 reps;Knee straight    Hip Extension AROM;Both;20 reps;Knee bent      Knee/Hip Exercises: Seated   Long Arc Quad AROM;Both;20 reps;Limitations    Long Arc Quad Limitations with ball squeeze    Clamshell with TheraBand Green   x20 rpes   Marching Strengthening;Both;20  reps;Limitations    Marching Limitations green theraband    Sit to Sand 10 reps;without UE support   holding ball; more fatigue more eccentric assist required from table                      PT Short Term Goals - 05/25/21 1215       PT SHORT TERM GOAL #1   Title Independent with an initial HEP    Time 3    Period Weeks    Status New    Target Date 06/15/21               PT Long Term Goals - 05/25/21 1215       PT LONG TERM GOAL #1   Title Independent with an advanced HEP.    Time 6    Period Weeks    Status New    Target Date 07/06/21      PT LONG TERM GOAL #2   Title Patient able to transfer from floor to standing with assist of furniture.    Baseline -    Time 6    Period Weeks    Status New      PT LONG TERM GOAL #3   Title Improved 5x sit to stand in <= 11 seconds demonstrating improved functional strength and decreasing fall risk.    Baseline -    Time 6    Period Weeks    Status New      PT LONG TERM GOAL #4   Title Patient able to climb stairs at home without difficulty using UE for balance only.    Time 6    Period Weeks    Status New      PT LONG TERM GOAL #5   Title Pt to demonstrate improved heel strike with gait bil.    Baseline -    Time 6    Period Weeks    Status New                   Plan - 07/12/21 1401     Clinical Impression Statement Patient presented in clinic with reports of generalized discomfort and fatigue. Patient appeared fatigued but able to tolerate some exercise. As the patient fatigues, he requires more assist eccentrically from table.    Personal Factors and Comorbidities Age    Comorbidities right shoulder pain, Arthrits, h/o piriformis  syndrome and sciatica, left knee surgery, lumbar surgery,    Examination-Activity Limitations Transfers;Stairs    Examination-Participation Restrictions Other    Stability/Clinical Decision Making Stable/Uncomplicated    Rehab Potential Excellent    PT  Frequency 2x / week    PT Duration 6 weeks    PT Treatment/Interventions ADLs/Self Care Home Management;Gait training;Stair training;Functional mobility training;Therapeutic activities;Therapeutic exercise;Balance training;Neuromuscular re-education;Patient/family education;Energy conservation    PT Next Visit Plan assess floor to stand transfer and work on this, assess lunge and squat; Cardio for endurance, functional strengthening (squats, stairs, lunges), hip extension strenth; add gastroc stretch to HEP    PT Home Exercise Plan [id number] Supine Bridge - 2 x daily - 7 x weekly - 1-3 sets - 10 reps  Forward Step Up with Unilateral Counter Support - 1 x daily - 7 x weekly - 2 sets - 10 reps  Sit to Stand - 2 x daily - 7 x weekly - 1-2 sets - 10 reps    Consulted and Agree with Plan of Care Patient             Patient will benefit from skilled therapeutic intervention in order to improve the following deficits and impairments:  Abnormal gait, Pain, Decreased strength, Impaired flexibility, Decreased endurance  Visit Diagnosis: Muscle weakness (generalized)  Acute pain of right shoulder  Stiffness of right shoulder, not elsewhere classified     Problem List Patient Active Problem List   Diagnosis Date Noted   Fall 09/27/2019   Hypertension    Benign prostatic hyperplasia    Chronic back pain    Fall at home, initial encounter    Temporal bone fracture (HCC)    SDH (subdural hematoma)    Marijuana abuse    Alcohol abuse    MDD (major depressive disorder) 09/18/2013   Opiate dependence ([place]) 09/18/2013    Standley Brooking, PTA 07/12/2021, 2:19 PM  [facility] [address] [place], [place], [address] Phone: [phone]   Fax:  [phone]  Name: David Carroll MRN: [id number] Date of Birth: December 06, 1940

## 2021-07-19 ENCOUNTER — Other Ambulatory Visit: Payer: Self-pay

## 2021-07-19 ENCOUNTER — Encounter: Payer: Self-pay | Admitting: Physical Therapy

## 2021-07-19 ENCOUNTER — Ambulatory Visit: Payer: Medicare Other | Attending: Sports Medicine | Admitting: Physical Therapy

## 2021-07-19 DIAGNOSIS — M6281 Muscle weakness (generalized): Secondary | ICD-10-CM | POA: Insufficient documentation

## 2021-07-19 NOTE — Therapy (Signed)
[facility] [address] [place], [place], [address] Phone: [phone]   Fax:  [phone]  Physical Therapy Treatment  Patient Details  Name: David Carroll MRN: [id number] Date of Birth: 1940/08/19 Referring Provider (PT): Delano Metz MD   Encounter Date: 07/19/2021   PT End of Session - 07/19/21 1322     Visit Number 8    Number of Visits 12    Date for PT Re-Evaluation 07/06/21    Authorization Type FOTO AT LEAST EVERY 5TH VISIT.  PROGRESS NOTE AT 10TH VISIT.  KX MODIFIER AFTER 15 VISITS.    PT Start Time 1305    PT Stop Time 1344    PT Time Calculation (min) 39 min    Activity Tolerance Patient limited by fatigue    Behavior During Therapy WFL for tasks assessed/performed             Past Medical History:  Diagnosis Date   Arthritis    KNEES   BPH (benign prostatic hypertrophy)    Complication of anesthesia SLOW RECOVERING FROM ANES. DRUGS   HX OPIATE ADDICTION-- PT ON SUBOXONE   Depression    Glaucoma    History of gastric ulcer    History of gastroesophageal reflux (GERD)    History of kidney stones    Hydronephrosis, left    Hypertension    Nephrolithiasis    Opiate addiction (H[facility])    Urgency of urination     Past Surgical History:  Procedure Laterality Date   CATARACT EXTRACTION W/ INTRAOCULAR LENS  IMPLANT, BILATERAL     CYSTOSCOPY WITH BIOPSY Left 11/06/2012   Procedure: CYSTOSCOPY WITH BIOPSY;  Surgeon: Sebastian Ache, MD;  Location: [facility];  Service: Urology;  Laterality: Left;   EXTRACORPOREAL SHOCK WAVE LITHOTRIPSY Left 03-06-2011   HOLMIUM LASER APPLICATION Left 11/06/2012   Procedure: HOLMIUM LASER APPLICATION;  Surgeon: Sebastian Ache, MD;  Location: [facility];  Service: Urology;  Laterality: Left;   INGUINAL HERNIA REPAIR Bilateral 1970   RE-DO LEFT 1985   LUMBAR DISC SURGERY  09-24-2008   L3 -- L4   LUMBAR LAMINECTOMY  05/15/2019   with partial facet  joint removal   MEDIAL COMPARTMENT ARTHROPLASTY, LEFT KNEE  02-22-2010   REMOVAL BENIGN MASS LEFT LUNG  1977   VAGOTOMY  1986   ULCERS    There were no vitals filed for this visit.   Subjective Assessment - 07/19/21 1319     Subjective Reports that the orthopedist said his shoulder would not improve unless with a TSR which he is not willing to do. Feels fair overall.    Pertinent History Arthrits, h/o piriformis syndrome and sciatica, left knee surgery, lumbar surgery,    How long can you walk comfortably? Short community distances.    Patient Stated Goals Use right arm again without pain.    Currently in Pain? No/denies                [facility] PT Assessment - 07/19/21 0001       Assessment   Medical Diagnosis muscular deconditioning    Referring Provider (PT) Royston Sinner Corrington MD    Onset Date/Surgical Date 05/14/21    Prior Therapy y      Precautions   Precautions Fall    Precaution Comments repeated falls in the past                           [facility] Adult  PT Treatment/Exercise - 07/19/21 0001       Knee/Hip Exercises: Aerobic   Nustep L4 x15 min      Knee/Hip Exercises: Machines for Strengthening   Cybex Knee Extension 10-20# 3x10 reps    Cybex Knee Flexion 30 # 3x10 reps      Knee/Hip Exercises: Standing   Heel Raises Both;20 reps    Heel Raises Limitations B toe raise x20 rpes    Lateral Step Up Both;15 reps;Hand Hold: 2;Step Height: 6"    Forward Step Up Both;15 reps;Hand Hold: 2;Step Height: 6"      Knee/Hip Exercises: Seated   Clamshell with TheraBand Red   x20 reps   Other Seated Knee/Hip Exercises B hip D2 x15 reps                       PT Short Term Goals - 05/25/21 1215       PT SHORT TERM GOAL #1   Title Independent with an initial HEP    Time 3    Period Weeks    Status New    Target Date 06/15/21               PT Long Term Goals - 05/25/21 1215       PT LONG TERM GOAL #1   Title Independent with an  advanced HEP.    Time 6    Period Weeks    Status New    Target Date 07/06/21      PT LONG TERM GOAL #2   Title Patient able to transfer from floor to standing with assist of furniture.    Baseline -    Time 6    Period Weeks    Status New      PT LONG TERM GOAL #3   Title Improved 5x sit to stand in <= 11 seconds demonstrating improved functional strength and decreasing fall risk.    Baseline -    Time 6    Period Weeks    Status New      PT LONG TERM GOAL #4   Title Patient able to climb stairs at home without difficulty using UE for balance only.    Time 6    Period Weeks    Status New      PT LONG TERM GOAL #5   Title Pt to demonstrate improved heel strike with gait bil.    Baseline -    Time 6    Period Weeks    Status New                   Plan - 07/19/21 1353     Clinical Impression Statement Patient presented in clinic with more understanding of the chronic R shoulder pain and is unwilling to have R TSA. Patient has also recievd an injection in L foot since last week and notes improvement in foot pain. Patient progressed with more resistance and more functional training. Patient able to tolerate more overall exercises before reporting fatigue. Patient reported trembling in his feet upon end of session.    Personal Factors and Comorbidities Age    Comorbidities right shoulder pain, Arthrits, h/o piriformis syndrome and sciatica, left knee surgery, lumbar surgery,    Examination-Activity Limitations Transfers;Stairs    Examination-Participation Restrictions Other    Stability/Clinical Decision Making Stable/Uncomplicated    Rehab Potential Excellent    PT Frequency 2x / week    PT Duration 6 weeks  PT Treatment/Interventions ADLs/Self Care Home Management;Gait training;Stair training;Functional mobility training;Therapeutic activities;Therapeutic exercise;Balance training;Neuromuscular re-education;Patient/family education;Energy conservation    PT Next  Visit Plan assess floor to stand transfer and work on this, assess lunge and squat; Cardio for endurance, functional strengthening (squats, stairs, lunges), hip extension strenth; add gastroc stretch to HEP    PT Home Exercise Plan [id number] Supine Bridge - 2 x daily - 7 x weekly - 1-3 sets - 10 reps  Forward Step Up with Unilateral Counter Support - 1 x daily - 7 x weekly - 2 sets - 10 reps  Sit to Stand - 2 x daily - 7 x weekly - 1-2 sets - 10 reps    Consulted and Agree with Plan of Care Patient             Patient will benefit from skilled therapeutic intervention in order to improve the following deficits and impairments:  Abnormal gait, Pain, Decreased strength, Impaired flexibility, Decreased endurance  Visit Diagnosis: Muscle weakness (generalized)     Problem List Patient Active Problem List   Diagnosis Date Noted   Fall 09/27/2019   Hypertension    Benign prostatic hyperplasia    Chronic back pain    Fall at home, initial encounter    Temporal bone fracture (HCC)    SDH (subdural hematoma)    Marijuana abuse    Alcohol abuse    MDD (major depressive disorder) 09/18/2013   Opiate dependence (H[facility]) 09/18/2013    Marvell Fuller, PTA 07/19/2021, 2:12 PM  [facility] [address] [place], [place], [address] Phone: [phone]   Fax:  [phone]  Name: David Carroll MRN: [id number] Date of Birth: 16-Nov-1940

## 2021-07-26 ENCOUNTER — Other Ambulatory Visit: Payer: Self-pay

## 2021-07-26 ENCOUNTER — Ambulatory Visit: Payer: Medicare Other | Admitting: Physical Therapy

## 2021-07-26 ENCOUNTER — Encounter: Payer: Self-pay | Admitting: Physical Therapy

## 2021-07-26 DIAGNOSIS — M6281 Muscle weakness (generalized): Secondary | ICD-10-CM | POA: Diagnosis not present

## 2021-07-26 NOTE — Addendum Note (Signed)
Addended by: Granville Lewis on: 07/26/2021 04:07 PM   Modules accepted: Orders

## 2021-07-26 NOTE — Therapy (Signed)
[facility] [address] [place], [place], [address] Phone: [phone]   Fax:  [phone]  Physical Therapy Treatment  Patient Details  Name: David Carroll MRN: [id number] Date of Birth: 27-Nov-1940 Referring Provider (PT): Delano Metz MD   Encounter Date: 07/26/2021   PT End of Session - 07/26/21 1322     Visit Number 9    Number of Visits 12    Date for PT Re-Evaluation 07/06/21    Authorization Type FOTO AT LEAST EVERY 5TH VISIT.  PROGRESS NOTE AT 10TH VISIT.  KX MODIFIER AFTER 15 VISITS.    PT Start Time 1314    PT Stop Time 1344    PT Time Calculation (min) 30 min    Activity Tolerance Patient limited by fatigue    Behavior During Therapy WFL for tasks assessed/performed             Past Medical History:  Diagnosis Date   Arthritis    KNEES   BPH (benign prostatic hypertrophy)    Complication of anesthesia SLOW RECOVERING FROM ANES. DRUGS   HX OPIATE ADDICTION-- PT ON SUBOXONE   Depression    Glaucoma    History of gastric ulcer    History of gastroesophageal reflux (GERD)    History of kidney stones    Hydronephrosis, left    Hypertension    Nephrolithiasis    Opiate addiction ([facility]CC)    Urgency of urination     Past Surgical History:  Procedure Laterality Date   CATARACT EXTRACTION W/ INTRAOCULAR LENS  IMPLANT, BILATERAL     CYSTOSCOPY WITH BIOPSY Left 11/06/2012   Procedure: CYSTOSCOPY WITH BIOPSY;  Surgeon: Sebastian Ache, MD;  Location: [facility];  Service: Urology;  Laterality: Left;   EXTRACORPOREAL SHOCK WAVE LITHOTRIPSY Left 03-06-2011   HOLMIUM LASER APPLICATION Left 11/06/2012   Procedure: HOLMIUM LASER APPLICATION;  Surgeon: Sebastian Ache, MD;  Location: [facility];  Service: Urology;  Laterality: Left;   INGUINAL HERNIA REPAIR Bilateral 1970   RE-DO LEFT 1985   LUMBAR DISC SURGERY  09-24-2008   L3 -- L4   LUMBAR LAMINECTOMY  05/15/2019   with partial facet  joint removal   MEDIAL COMPARTMENT ARTHROPLASTY, LEFT KNEE  02-22-2010   REMOVAL BENIGN MASS LEFT LUNG  1977   VAGOTOMY  1986   ULCERS    There were no vitals filed for this visit.   Subjective Assessment - 07/26/21 1321     Subjective States that he slept the best last night than he has in months.    Pertinent History Arthrits, h/o piriformis syndrome and sciatica, left knee surgery, lumbar surgery,    How long can you walk comfortably? Short community distances.    Patient Stated Goals Use right arm again without pain.    Currently in Pain? No/denies                [facility] PT Assessment - 07/26/21 0001       Assessment   Medical Diagnosis muscular deconditioning    Referring Provider (PT) Royston Sinner Corrington MD    Onset Date/Surgical Date 05/14/21    Prior Therapy y      Precautions   Precautions Fall    Precaution Comments repeated falls in the past                           [facility] Adult PT Treatment/Exercise - 07/26/21 0001  Knee/Hip Exercises: Aerobic   Nustep L4 x15 min      Knee/Hip Exercises: Machines for Strengthening   Cybex Knee Extension 20# 3x10 reps    Cybex Knee Flexion 40 # 3x10 reps      Knee/Hip Exercises: Standing   Heel Raises Both;15 reps    Heel Raises Limitations B toe raise x20 rpes    Hip Flexion AROM;Both;15 reps;Knee bent;Limitations    Hip Flexion Limitations 2#    Hip Abduction AROM;Both;15 reps;Knee straight;Limitations    Abduction Limitations 2#                 Balance Exercises - 07/26/21 0001       Balance Exercises: Standing   Standing Eyes Opened Narrow base of support (BOS);Foam/compliant surface;Time    Standing Eyes Opened Time 2 min    Tandem Stance Eyes open;Intermittent upper extremity support;Time    Tandem Stance Limitations 2 min    SLS Eyes open;Solid surface;Upper extremity support 2;Time;Modified   toe touch for contralateral   SLS Time x1 min each                  PT  Short Term Goals - 05/25/21 1215       PT SHORT TERM GOAL #1   Title Independent with an initial HEP    Time 3    Period Weeks    Status New    Target Date 06/15/21               PT Long Term Goals - 05/25/21 1215       PT LONG TERM GOAL #1   Title Independent with an advanced HEP.    Time 6    Period Weeks    Status New    Target Date 07/06/21      PT LONG TERM GOAL #2   Title Patient able to transfer from floor to standing with assist of furniture.    Baseline -    Time 6    Period Weeks    Status New      PT LONG TERM GOAL #3   Title Improved 5x sit to stand in <= 11 seconds demonstrating improved functional strength and decreasing fall risk.    Baseline -    Time 6    Period Weeks    Status New      PT LONG TERM GOAL #4   Title Patient able to climb stairs at home without difficulty using UE for balance only.    Time 6    Period Weeks    Status New      PT LONG TERM GOAL #5   Title Pt to demonstrate improved heel strike with gait bil.    Baseline -    Time 6    Period Weeks    Status New                   Plan - 07/26/21 1356     Clinical Impression Statement Patient presented in clinic with reports of feeling good as he had slept great last night. Patient able to progress minimally with resistance but still fatiguing fairly quickly. Patient observed with posterior lean with balance activity and is cognizant of that. Intermittant UE support required for balance activities.    Personal Factors and Comorbidities Age    Comorbidities right shoulder pain, Arthrits, h/o piriformis syndrome and sciatica, left knee surgery, lumbar surgery,    Examination-Activity Limitations Transfers;Stairs    Examination-Participation  Restrictions Other    Stability/Clinical Decision Making Stable/Uncomplicated    Rehab Potential Excellent    PT Frequency 2x / week    PT Duration 6 weeks    PT Treatment/Interventions ADLs/Self Care Home Management;Gait  training;Stair training;Functional mobility training;Therapeutic activities;Therapeutic exercise;Balance training;Neuromuscular re-education;Patient/family education;Energy conservation    PT Next Visit Plan assess floor to stand transfer and work on this, assess lunge and squat; Cardio for endurance, functional strengthening (squats, stairs, lunges), hip extension strenth; add gastroc stretch to HEP    PT Home Exercise Plan [id number] Supine Bridge - 2 x daily - 7 x weekly - 1-3 sets - 10 reps  Forward Step Up with Unilateral Counter Support - 1 x daily - 7 x weekly - 2 sets - 10 reps  Sit to Stand - 2 x daily - 7 x weekly - 1-2 sets - 10 reps    Consulted and Agree with Plan of Care Patient             Patient will benefit from skilled therapeutic intervention in order to improve the following deficits and impairments:  Abnormal gait, Pain, Decreased strength, Impaired flexibility, Decreased endurance  Visit Diagnosis: Muscle weakness (generalized)     Problem List Patient Active Problem List   Diagnosis Date Noted   Fall 09/27/2019   Hypertension    Benign prostatic hyperplasia    Chronic back pain    Fall at home, initial encounter    Temporal bone fracture (HCC)    SDH (subdural hematoma)    Marijuana abuse    Alcohol abuse    MDD (major depressive disorder) 09/18/2013   Opiate dependence ([facility]) 09/18/2013    Marvell Fuller, PTA 07/26/2021, 2:03 PM  [facility] [address] [place], [place], [address] Phone: [phone]   Fax:  [phone]  Name: Amarius Toto MRN: [id number] Date of Birth: 03-Jun-1941

## 2021-08-04 ENCOUNTER — Ambulatory Visit: Payer: Medicare Other

## 2021-08-04 ENCOUNTER — Other Ambulatory Visit: Payer: Self-pay

## 2021-08-04 DIAGNOSIS — M6281 Muscle weakness (generalized): Secondary | ICD-10-CM

## 2021-08-04 NOTE — Therapy (Addendum)
[facility] [place], [place], [address] Phone: [phone]   Fax:  [phone]  Physical Therapy Treatment  Patient Details  Name: Zander Ransburg MRN: [id number] Date of Birth: [birth year] Referring Provider (PT): Ledora Bottcher MD   Encounter Date: 08/04/2021   PT End of Session - 08/04/21 1304     Visit Number 10    Number of Visits 12    Date for PT Re-Evaluation 08/26/21    Authorization Type FOTO AT LEAST EVERY 5TH VISIT.  PROGRESS NOTE AT 10TH VISIT.  KX MODIFIER AFTER 15 VISITS.    PT Start Time 1300    PT Stop Time 1340    PT Time Calculation (min) 40 min    Activity Tolerance Patient limited by fatigue    Behavior During Therapy WFL for tasks assessed/performed             Past Medical History:  Diagnosis Date   Arthritis    KNEES   BPH (benign prostatic hypertrophy)    Complication of anesthesia SLOW RECOVERING FROM ANES. DRUGS   HX OPIATE ADDICTION-- PT ON SUBOXONE   Depression    Glaucoma    History of gastric ulcer    History of gastroesophageal reflux (GERD)    History of kidney stones    Hydronephrosis, left    Hypertension    Nephrolithiasis    Opiate addiction ([place])    Urgency of urination     Past Surgical History:  Procedure Laterality Date   CATARACT EXTRACTION W/ INTRAOCULAR LENS  IMPLANT, BILATERAL     CYSTOSCOPY WITH BIOPSY Left 11/06/2012   Procedure: CYSTOSCOPY WITH BIOPSY;  Surgeon: Alexis Frock, MD;  Location: [facility];  Service: Urology;  Laterality: Left;   EXTRACORPOREAL SHOCK WAVE LITHOTRIPSY Left 03-06-2011   HOLMIUM LASER APPLICATION Left [id number]   Procedure: HOLMIUM LASER APPLICATION;  Surgeon: Alexis Frock, MD;  Location: [facility];  Service: Urology;  Laterality: Left;   INGUINAL HERNIA REPAIR Bilateral 1970   RE-DO [place] SURGERY  09-24-2008   L3 -- L4   LUMBAR LAMINECTOMY  05/15/2019   with partial facet  joint removal   MEDIAL COMPARTMENT ARTHROPLASTY, LEFT KNEE  02-22-2010   REMOVAL BENIGN MASS LEFT LUNG  1977   VAGOTOMY  1986   ULCERS    There were no vitals filed for this visit.   Subjective Assessment - 08/04/21 1303     Subjective Pt arrives for today's treatment session reporting 5/10 B hip and R shoulder pain.  Pt states that he was recently on a long plane ride and it flared everyting up.    Pertinent History Arthrits, h/o piriformis syndrome and sciatica, left knee surgery, lumbar surgery,    How long can you walk comfortably? Short community distances.    Patient Stated Goals Use right arm again without pain.    Currently in Pain? Yes    Pain Score 5     Pain Location Generalized                               [facility] Adult PT Treatment/Exercise - 08/04/21 0001       Knee/Hip Exercises: Aerobic   Nustep Lvl 4 x 15 mins      Knee/Hip Exercises: Machines for Strengthening   Cybex Knee Extension 30# 3x10 reps    Cybex Knee Flexion 50# 3x10 reps  Knee/Hip Exercises: Standing   Heel Raises Both;20 reps    Heel Raises Limitations B toe raise x20 rpes    Hip Flexion AROM;Both;Knee bent;Limitations;20 reps    Hip Flexion Limitations 2#    Hip Abduction AROM;Both;Knee straight;Limitations;20 reps    Abduction Limitations 2#    Hip Extension AROM;Both;20 reps;Knee bent    Lateral Step Up Both;Hand Hold: 2;Step Height: 6";20 reps    Forward Step Up Both;Hand Hold: 2;Step Height: 6";20 reps      Knee/Hip Exercises: Seated   Long Arc Quad Strengthening;Both;20 reps;Weights    Long Arc Quad Weight 3 lbs.    Long Arc Quad Limitations with ball squeeze    Clamshell with TheraBand Red   20 reps                      PT Short Term Goals - 08/04/21 1305       PT SHORT TERM GOAL #1   Title Independent with an initial HEP    Time 3    Period Weeks    Status Achieved    Target Date 06/15/21               PT Long Term Goals -  08/04/21 1305       PT LONG TERM GOAL #1   Title Independent with an advanced HEP.    Time 6    Period Weeks    Status Achieved    Target Date 07/06/21      PT LONG TERM GOAL #2   Title Patient able to transfer from floor to standing with assist of furniture.    Baseline 08/04/21: progressing    Time 6    Period Weeks    Status On-going      PT LONG TERM GOAL #3   Title Improved 5x sit to stand in <= 11 seconds demonstrating improved functional strength and decreasing fall risk.    Baseline 08/04/21: 22 seconds    Time 6    Period Weeks    Status New      PT LONG TERM GOAL #4   Title Patient able to climb stairs at home without difficulty using UE for balance only.    Baseline 08/04/21: reports continued use of UE    Time 6    Period Weeks    Status On-going      PT LONG TERM GOAL #5   Title Pt to demonstrate improved heel strike with gait bil.    Time 6    Period Weeks    Status On-going                   Plan - 08/04/21 1305     Clinical Impression Statement Pt arrives for today's treatment session reporting 5/10 B hip and right shoulder pain.  Pt states that he went to chair yoga earlier today and feels a little more tired than usual.  Pt able to tolerate increase reps with all exercises today without issue.  Pt able to perofrm 5 STS in 22 seconds and is making progress towards all of his goals at this time.  Pt reported 4/10 B hip pain and increased fatigue at completion of today's treatment session.    Personal Factors and Comorbidities Age    Comorbidities right shoulder pain, Arthrits, h/o piriformis syndrome and sciatica, left knee surgery, lumbar surgery,    Examination-Activity Limitations Transfers;Stairs    Examination-Participation Restrictions Other    Stability/Clinical Decision Making  Stable/Uncomplicated    Rehab Potential Excellent    PT Frequency 2x / week    PT Duration 6 weeks    PT Treatment/Interventions ADLs/Self Care Home  Management;Gait training;Stair training;Functional mobility training;Therapeutic activities;Therapeutic exercise;Balance training;Neuromuscular re-education;Patient/family education;Energy conservation    PT Next Visit Plan assess floor to stand transfer and work on this, assess lunge and squat; Cardio for endurance, functional strengthening (squats, stairs, lunges), hip extension strenth; add gastroc stretch to HEP    PT Home Exercise Plan [id number] Supine Bridge - 2 x daily - 7 x weekly - 1-3 sets - 10 reps  Forward Step Up with Unilateral Counter Support - 1 x daily - 7 x weekly - 2 sets - 10 reps  Sit to Stand - 2 x daily - 7 x weekly - 1-2 sets - 10 reps    Consulted and Agree with Plan of Care Patient             Patient will benefit from skilled therapeutic intervention in order to improve the following deficits and impairments:  Abnormal gait, Pain, Decreased strength, Impaired flexibility, Decreased endurance  Visit Diagnosis: Muscle weakness (generalized)     Problem List Patient Active Problem List   Diagnosis Date Noted   Fall 09/27/2019   Hypertension    Benign prostatic hyperplasia    Chronic back pain    Fall at home, initial encounter    Temporal bone fracture (HCC)    SDH (subdural hematoma)    Marijuana abuse    Alcohol abuse    MDD (major depressive disorder) 09/18/2013   Opiate dependence ([place]) 09/18/2013    Kathrynn Ducking, PTA 08/04/2021, 1:44 PM  [facility] [place], [place], [address] Phone: [phone]   Fax:  [phone]  Name: Ansumana Gens MRN: [id number] Date of Birth: 1941/04/10  Progress Note Reporting Period 05/25/21 to 08/04/21  See note below for Objective Data and Assessment of Progress/Goals.    Patient is making minimal progress with skilled physical therapy as he has yet to meet his goals for therapy. Recommend that he continue with his current plan of care. He will be  discharged at the conclusion of his plan of care if he is unable to demonstrate significant progress.   Jacqulynn Cadet, PT, DPT

## 2021-08-09 ENCOUNTER — Other Ambulatory Visit: Payer: Self-pay

## 2021-08-09 ENCOUNTER — Ambulatory Visit: Payer: Medicare Other | Admitting: Physical Therapy

## 2021-08-09 ENCOUNTER — Encounter: Payer: Self-pay | Admitting: Physical Therapy

## 2021-08-09 DIAGNOSIS — M6281 Muscle weakness (generalized): Secondary | ICD-10-CM | POA: Diagnosis not present

## 2021-08-09 NOTE — Therapy (Signed)
[facility] [place], [place], [address] Phone: [phone]   Fax:  [phone]  Physical Therapy Treatment  Patient Details  Name: David Carroll MRN: [id number] Date of Birth: 1940-08-30 Referring Provider (PT): Ledora Bottcher MD   Encounter Date: 08/09/2021   PT End of Session - 08/09/21 1407     Visit Number 11    Number of Visits 12    Date for PT Re-Evaluation 08/26/21    Authorization Type FOTO AT LEAST EVERY 5TH VISIT.  PROGRESS NOTE AT 10TH VISIT.  KX MODIFIER AFTER 15 VISITS.    PT Start Time 1356    PT Stop Time 1427   late arrival   PT Time Calculation (min) 31 min    Activity Tolerance Patient limited by fatigue    Behavior During Therapy WFL for tasks assessed/performed             Past Medical History:  Diagnosis Date   Arthritis    KNEES   BPH (benign prostatic hypertrophy)    Complication of anesthesia SLOW RECOVERING FROM ANES. DRUGS   HX OPIATE ADDICTION-- PT ON SUBOXONE   Depression    Glaucoma    History of gastric ulcer    History of gastroesophageal reflux (GERD)    History of kidney stones    Hydronephrosis, left    Hypertension    Nephrolithiasis    Opiate addiction ([place])    Urgency of urination     Past Surgical History:  Procedure Laterality Date   CATARACT EXTRACTION W/ INTRAOCULAR LENS  IMPLANT, BILATERAL     CYSTOSCOPY WITH BIOPSY Left 11/06/2012   Procedure: CYSTOSCOPY WITH BIOPSY;  Surgeon: Alexis Frock, MD;  Location: [facility];  Service: Urology;  Laterality: Left;   EXTRACORPOREAL SHOCK WAVE LITHOTRIPSY Left 03-06-2011   HOLMIUM LASER APPLICATION Left 01/27/5630   Procedure: HOLMIUM LASER APPLICATION;  Surgeon: Alexis Frock, MD;  Location: [facility];  Service: Urology;  Laterality: Left;   INGUINAL HERNIA REPAIR Bilateral 1970   RE-DO [place] SURGERY  09-24-2008   L3 -- L4   LUMBAR LAMINECTOMY  05/15/2019   with  partial facet joint removal   MEDIAL COMPARTMENT ARTHROPLASTY, LEFT KNEE  02-22-2010   REMOVAL BENIGN MASS LEFT LUNG  1977   VAGOTOMY  1986   ULCERS    There were no vitals filed for this visit.   Subjective Assessment - 08/09/21 1357     Subjective Reports doing fairly well today.    Pertinent History Arthrits, h/o piriformis syndrome and sciatica, left knee surgery, lumbar surgery,    How long can you walk comfortably? Short community distances.    Patient Stated Goals Use right arm again without pain.    Currently in Pain? No/denies                [facility] PT Assessment - 08/09/21 0001       Assessment   Medical Diagnosis muscular deconditioning    Referring Provider (PT) Talbert Forest Corrington MD    Onset Date/Surgical Date 05/14/21    Prior Therapy y      Precautions   Precautions Fall    Precaution Comments repeated falls in the past                           [facility] Adult PT Treatment/Exercise - 08/09/21 0001       Knee/Hip Exercises: Aerobic  Nustep Lvl 4 x 15 mins      Knee/Hip Exercises: Machines for Strengthening   Cybex Knee Extension 30# 3x10 reps    Cybex Knee Flexion 50# 3x10 reps      Knee/Hip Exercises: Standing   Forward Lunges Both;15 reps;3 seconds;Limitations    Forward Lunges Limitations off 8" step to simulate getting up from floors    Functional Squat 15 reps   to high plinth for fall recovery trainign     Knee/Hip Exercises: Seated   Marching AROM;Both;15 reps    Sit to Sand 15 reps;with UE support;without UE support   VC'd to reduce UE support as initially he was using 50% UE                Balance Exercises - 08/09/21 0001       Balance Exercises: Standing   Tandem Stance Eyes open;Intermittent upper extremity support;Time    Tandem Stance Limitations 2 min    Marching Foam/compliant surface;Upper extremity assist 2;Static;15 reps                  PT Short Term Goals - 08/04/21 1305       PT SHORT  TERM GOAL #1   Title Independent with an initial HEP    Time 3    Period Weeks    Status Achieved    Target Date 06/15/21               PT Long Term Goals - 08/09/21 1409       PT LONG TERM GOAL #1   Title Independent with an advanced HEP.    Time 6    Period Weeks    Status Achieved    Target Date 07/06/21      PT LONG TERM GOAL #2   Title Patient able to transfer from floor to standing with assist of furniture.    Baseline 08/04/21: progressing    Time 6    Period Weeks    Status On-going      PT LONG TERM GOAL #3   Title Improved 5x sit to stand in <= 11 seconds demonstrating improved functional strength and decreasing fall risk.    Baseline 08/04/21: 22 seconds    Time 6    Period Weeks    Status New      PT LONG TERM GOAL #4   Title Patient able to climb stairs at home without difficulty using UE for balance only.    Baseline 08/04/21: reports continued use of UE    Time 6    Period Weeks    Status Partially Met   "pulling myself up" sometimes     PT LONG TERM GOAL #5   Title Pt to demonstrate improved heel strike with gait bil.    Time 6    Period Weeks    Status On-going                   Plan - 08/09/21 1438     Clinical Impression Statement Patient presented in clinic with no new complaints and no new falls since PT. Patient progressed to fall recovery simulation such as forward lunges to high step in which patient indicated knee discomfort along with mini squats. Patient observed ambulating with B foot flat while in clinic. Patient still active with chair yoga at local gym. WBing during balance activities primarily posterior but patient is self aware and can fix independently. Patient fatigued by end of session.  Personal Factors and Comorbidities Age    Comorbidities right shoulder pain, Arthrits, h/o piriformis syndrome and sciatica, left knee surgery, lumbar surgery,    Examination-Activity Limitations Transfers;Stairs     Examination-Participation Restrictions Other    Stability/Clinical Decision Making Stable/Uncomplicated    Rehab Potential Excellent    PT Frequency 2x / week    PT Duration 6 weeks    PT Treatment/Interventions ADLs/Self Care Home Management;Gait training;Stair training;Functional mobility training;Therapeutic activities;Therapeutic exercise;Balance training;Neuromuscular re-education;Patient/family education;Energy conservation    PT Next Visit Plan assess floor to stand transfer and work on this, assess lunge and squat; Cardio for endurance, functional strengthening (squats, stairs, lunges), hip extension strenth; add gastroc stretch to HEP    PT Home Exercise Plan [id number] Supine Bridge - 2 x daily - 7 x weekly - 1-3 sets - 10 reps  Forward Step Up with Unilateral Counter Support - 1 x daily - 7 x weekly - 2 sets - 10 reps  Sit to Stand - 2 x daily - 7 x weekly - 1-2 sets - 10 reps    Consulted and Agree with Plan of Care Patient             Patient will benefit from skilled therapeutic intervention in order to improve the following deficits and impairments:  Abnormal gait, Pain, Decreased strength, Impaired flexibility, Decreased endurance  Visit Diagnosis: Muscle weakness (generalized)     Problem List Patient Active Problem List   Diagnosis Date Noted   Fall 09/27/2019   Hypertension    Benign prostatic hyperplasia    Chronic back pain    Fall at home, initial encounter    Temporal bone fracture (HCC)    SDH (subdural hematoma)    Marijuana abuse    Alcohol abuse    MDD (major depressive disorder) 09/18/2013   Opiate dependence ([place]) 09/18/2013    Standley Brooking, PTA 08/09/2021, 2:42 PM  [facility] [address] [place], [place], [address] Phone: [phone]   Fax:  [phone]  Name: David Carroll MRN: [id number] Date of Birth: [birth year]

## 2021-08-16 ENCOUNTER — Ambulatory Visit: Payer: Medicare Other | Attending: Sports Medicine | Admitting: Physical Therapy

## 2021-08-16 DIAGNOSIS — M6281 Muscle weakness (generalized): Secondary | ICD-10-CM | POA: Insufficient documentation

## 2021-08-17 ENCOUNTER — Other Ambulatory Visit: Payer: Self-pay

## 2021-08-17 ENCOUNTER — Ambulatory Visit: Payer: Medicare Other

## 2021-08-17 DIAGNOSIS — M6281 Muscle weakness (generalized): Secondary | ICD-10-CM

## 2021-08-17 NOTE — Therapy (Addendum)
[facility] [place], [place], [address] Phone: [phone]   Fax:  [phone]  Physical Therapy Treatment  Patient Details  Name: David Carroll MRN: [id number] Date of Birth: 04/20/1941 Referring Provider (PT): Ledora Bottcher MD   Encounter Date: 08/17/2021   PT End of Session - 08/17/21 1304     Visit Number 12    Number of Visits 12    Date for PT Re-Evaluation 08/26/21    Authorization Type FOTO AT LEAST EVERY 5TH VISIT.  PROGRESS NOTE AT 10TH VISIT.  KX MODIFIER AFTER 15 VISITS.    PT Start Time 1302    PT Stop Time 1346    PT Time Calculation (min) 44 min    Activity Tolerance Patient limited by fatigue    Behavior During Therapy WFL for tasks assessed/performed             Past Medical History:  Diagnosis Date   Arthritis    KNEES   BPH (benign prostatic hypertrophy)    Complication of anesthesia SLOW RECOVERING FROM ANES. DRUGS   HX OPIATE ADDICTION-- PT ON SUBOXONE   Depression    Glaucoma    History of gastric ulcer    History of gastroesophageal reflux (GERD)    History of kidney stones    Hydronephrosis, left    Hypertension    Nephrolithiasis    Opiate addiction ([place])    Urgency of urination     Past Surgical History:  Procedure Laterality Date   CATARACT EXTRACTION W/ INTRAOCULAR LENS  IMPLANT, BILATERAL     CYSTOSCOPY WITH BIOPSY Left 11/06/2012   Procedure: CYSTOSCOPY WITH BIOPSY;  Surgeon: Alexis Frock, MD;  Location[facility];  Service: Urology;  Laterality: Left;   EXTRACORPOREAL SHOCK WAVE LITHOTRIPSY Left 03-06-2011   HOLMIUM LASER APPLICATION Left 6/75/9163   Procedure: HOLMIUM LASER APPLICATION;  Surgeon: Alexis Frock, MD;  Location: [facility];  Service: Urology;  Laterality: Left;   INGUINAL HERNIA REPAIR Bilateral 1970   RE-DO [place] SURGERY  09-24-2008   L3 -- L4   LUMBAR LAMINECTOMY  05/15/2019   with partial facet  joint removal   MEDIAL COMPARTMENT ARTHROPLASTY, LEFT KNEE  02-22-2010   REMOVAL BENIGN MASS LEFT LUNG  1977   VAGOTOMY  1986   ULCERS    There were no vitals filed for this visit.   Subjective Assessment - 08/17/21 1304     Subjective Pt arrives for today's treatment reporting "no more pain than normal."  Pt states that the soles of his feet are feeling much better.    Pertinent History Arthrits, h/o piriformis syndrome and sciatica, left knee surgery, lumbar surgery,    How long can you walk comfortably? Short community distances.    Patient Stated Goals Use right arm again without pain.                               [facility] Adult PT Treatment/Exercise - 08/17/21 0001       Knee/Hip Exercises: Aerobic   Nustep Lvl 4 x 15 mins      Knee/Hip Exercises: Machines for Strengthening   Cybex Knee Extension 30# 3x10 reps    Cybex Knee Flexion 50# 3x10 reps      Knee/Hip Exercises: Standing   Heel Raises Both;20 reps    Heel Raises Limitations B toe raise x20 reps    Hip Flexion  AROM;Both;Knee bent;Limitations;20 reps    Hip Flexion Limitations 2#    Forward Lunges Both;3 seconds;Limitations;20 reps    Forward Lunges Limitations off 8" step to simulate getting up from floors    Hip Abduction AROM;Both;Knee straight;Limitations;20 reps    Abduction Limitations 2#    Hip Extension AROM;Both;20 reps;Knee bent    Extension Limitations 2#    Lateral Step Up Both;Hand Hold: 2;Step Height: 6";20 reps    Forward Step Up Both;Hand Hold: 2;Step Height: 6";20 reps    Functional Squat 20 reps   to high treatment table for fall recovery training     Knee/Hip Exercises: Seated   Sit to Sand 15 reps;with UE support;without UE support   10 with single UE support, 5 no UE support                      PT Short Term Goals - 08/04/21 1305       PT SHORT TERM GOAL #1   Title Independent with an initial HEP    Time 3    Period Weeks    Status Achieved     Target Date 06/15/21               PT Long Term Goals - 08/17/21 1305       PT LONG TERM GOAL #1   Title Independent with an advanced HEP.    Time 6    Period Weeks    Status Achieved    Target Date 07/06/21      PT LONG TERM GOAL #2   Title Patient able to transfer from floor to standing with assist of furniture.    Baseline 08/04/21: progressing; 08/17/21: progressing    Time 6    Period Weeks    Status On-going      PT LONG TERM GOAL #3   Title Improved 5x sit to stand in <= 11 seconds demonstrating improved functional strength and decreasing fall risk.    Baseline 08/04/21: 22 seconds; 08/17/21: 20 seconds LOB x 1    Time 6    Period Weeks    Status On-going      PT LONG TERM GOAL #4   Title Patient able to climb stairs at home without difficulty using UE for balance only.    Baseline 08/04/21: reports continued use of UE    Time 6    Period Weeks    Status Partially Met   "pulling myself up" sometimes     PT LONG TERM GOAL #5   Title Pt to demonstrate improved heel strike with gait bil.    Baseline 08/17/21: demonstrates inconsistant heel strike bil with gait    Time 6    Period Weeks    Status Partially Met                   Plan - 08/17/21 1305     Clinical Impression Statement Pt arrives for today's treatment session reporting "no more pain than usual."  Pt states that the soles of his feet are feeling much better.  Pt has partially met several of his goals and has made good progress towards others.  Pt most challlenged by lack of endurance and activity tolerance, requiring seated rest breaks as needed throughout treatment session.  Pt encouraged to continue attending Chair Yoga every Tuesday and Thursday at the [employer].  Pt encouraged to call the facilty with any issues or concerns.    Personal Factors and Comorbidities  Age    Comorbidities right shoulder pain, Arthrits, h/o piriformis syndrome and sciatica, left knee surgery, lumbar surgery,     Examination-Activity Limitations Transfers;Stairs    Examination-Participation Restrictions Other    Stability/Clinical Decision Making Stable/Uncomplicated    Rehab Potential Excellent    PT Frequency 2x / week    PT Duration 6 weeks    PT Treatment/Interventions ADLs/Self Care Home Management;Gait training;Stair training;Functional mobility training;Therapeutic activities;Therapeutic exercise;Balance training;Neuromuscular re-education;Patient/family education;Energy conservation    PT Next Visit Plan assess floor to stand transfer and work on this, assess lunge and squat; Cardio for endurance, functional strengthening (squats, stairs, lunges), hip extension strenth; add gastroc stretch to HEP    PT Home Exercise Plan [id number] Supine Bridge - 2 x daily - 7 x weekly - 1-3 sets - 10 reps  Forward Step Up with Unilateral Counter Support - 1 x daily - 7 x weekly - 2 sets - 10 reps  Sit to Stand - 2 x daily - 7 x weekly - 1-2 sets - 10 reps    Consulted and Agree with Plan of Care Patient             Patient will benefit from skilled therapeutic intervention in order to improve the following deficits and impairments:  Abnormal gait, Pain, Decreased strength, Impaired flexibility, Decreased endurance  Visit Diagnosis: Muscle weakness (generalized)     Problem List Patient Active Problem List   Diagnosis Date Noted   Fall 09/27/2019   Hypertension    Benign prostatic hyperplasia    Chronic back pain    Fall at home, initial encounter    Temporal bone fracture (HCC)    SDH (subdural hematoma)    Marijuana abuse    Alcohol abuse    MDD (major depressive disorder) 09/18/2013   Opiate dependence ([place]) 09/18/2013    Kathrynn Ducking, PTA 08/17/2021, 1:48 PM  [facility] [address] [place], [place], [address] Phone: [phone]   Fax:  [phone]  Name: David Carroll MRN: [id number] Date of Birth: [birth year]  PHYSICAL THERAPY  DISCHARGE SUMMARY  Visits from Start of Care: 12  Current functional level related to goals / functional outcomes: Patient was able to improve his functional mobility. However, it was not significant enough to meet all of his goals for physical therapy.    Remaining deficits: Lower extremity strength, power, and balance   Education / Equipment: HEP    Patient agrees to discharge. Patient goals were partially met. Patient is being discharged due to maximized rehab potential.   Jacqulynn Cadet, PT, DPT

## 2022-01-31 ENCOUNTER — Other Ambulatory Visit: Payer: Self-pay

## 2022-01-31 ENCOUNTER — Emergency Department (HOSPITAL_BASED_OUTPATIENT_CLINIC_OR_DEPARTMENT_OTHER)
Admission: EM | Admit: 2022-01-31 | Discharge: 2022-02-01 | Disposition: A | Discharge status: Home / Self Care | Payer: Medicare Other | Attending: Emergency Medicine | Admitting: Emergency Medicine

## 2022-01-31 ENCOUNTER — Encounter (HOSPITAL_BASED_OUTPATIENT_CLINIC_OR_DEPARTMENT_OTHER): Payer: Self-pay

## 2022-01-31 DIAGNOSIS — Z87891 Personal history of nicotine dependence: Secondary | ICD-10-CM | POA: Diagnosis not present

## 2022-01-31 DIAGNOSIS — N400 Enlarged prostate without lower urinary tract symptoms: Secondary | ICD-10-CM | POA: Diagnosis not present

## 2022-01-31 DIAGNOSIS — K222 Esophageal obstruction: Secondary | ICD-10-CM | POA: Insufficient documentation

## 2022-01-31 DIAGNOSIS — Z980 Intestinal bypass and anastomosis status: Secondary | ICD-10-CM | POA: Insufficient documentation

## 2022-01-31 DIAGNOSIS — I10 Essential (primary) hypertension: Secondary | ICD-10-CM | POA: Diagnosis not present

## 2022-01-31 DIAGNOSIS — Z8711 Personal history of peptic ulcer disease: Secondary | ICD-10-CM | POA: Diagnosis not present

## 2022-01-31 DIAGNOSIS — Z87442 Personal history of urinary calculi: Secondary | ICD-10-CM | POA: Insufficient documentation

## 2022-01-31 DIAGNOSIS — T18128A Food in esophagus causing other injury, initial encounter: Secondary | ICD-10-CM | POA: Diagnosis present

## 2022-01-31 DIAGNOSIS — J449 Chronic obstructive pulmonary disease, unspecified: Secondary | ICD-10-CM | POA: Diagnosis not present

## 2022-01-31 LAB — COMPREHENSIVE METABOLIC PANEL
ALT: 26 U/L (ref 0–44)
AST: 25 U/L (ref 15–41)
Albumin: 4.2 g/dL (ref 3.5–5.0)
Alkaline Phosphatase: 75 U/L (ref 38–126)
Anion gap: 11 (ref 5–15)
BUN: 17 mg/dL (ref 8–23)
CO2: 26 mmol/L (ref 22–32)
Calcium: 9.1 mg/dL (ref 8.9–10.3)
Chloride: 105 mmol/L (ref 98–111)
Creatinine, Ser: 0.93 mg/dL (ref 0.61–1.24)
GFR, Estimated: >60 mL/min (ref >60)
Glucose, Bld: 137 mg/dL — ABNORMAL HIGH (ref 70–99)
Potassium: 3.6 mmol/L (ref 3.5–5.1)
Sodium: 142 mmol/L (ref 135–145)
Total Bilirubin: 0.9 mg/dL (ref 0.3–1.2)
Total Protein: 7.8 g/dL (ref 6.5–8.1)

## 2022-01-31 LAB — LIPASE, BLOOD: Lipase: 11 U/L (ref 11–51)

## 2022-01-31 LAB — CBC
HCT: 41.2 % (ref 39.0–52.0)
Hemoglobin: 14.4 g/dL (ref 13.0–17.0)
MCH: 33.1 pg (ref 26.0–34.0)
MCHC: 35 g/dL (ref 30.0–36.0)
MCV: 94.7 fL (ref 80.0–100.0)
Platelets: 156 10*3/uL (ref 150–400)
RBC: 4.35 MIL/uL (ref 4.22–5.81)
RDW: 12.9 % (ref 11.5–15.5)
WBC: 7.3 10*3/uL (ref 4.0–10.5)
nRBC: 0 % (ref 0.0–0.2)

## 2022-01-31 MED ORDER — ONDANSETRON HCL 4 MG/2ML IJ SOLN
4.0000 mg | Freq: Once | INTRAMUSCULAR | Status: AC
  Administered 2022-01-31: 4 mg via INTRAVENOUS
  Filled 2022-01-31: qty 2

## 2022-01-31 MED ORDER — FENTANYL CITRATE PF 50 MCG/ML IJ SOSY
50.0000 ug | PREFILLED_SYRINGE | Freq: Once | INTRAMUSCULAR | Status: AC
  Administered 2022-01-31: 50 ug via INTRAVENOUS
  Filled 2022-01-31: qty 1

## 2022-01-31 MED ORDER — SODIUM CHLORIDE 0.9 % IV SOLN: 1000.0000 mL | INTRAVENOUS | Status: DC

## 2022-01-31 MED ORDER — FENTANYL CITRATE (PF) 100 MCG/2ML IJ SOLN
INTRAMUSCULAR | Status: AC
  Filled 2022-01-31: qty 2

## 2022-01-31 MED ORDER — SODIUM CHLORIDE 0.9 % IV BOLUS (SEPSIS): 1000.0000 mL | Freq: Once | INTRAVENOUS | Status: DC

## 2022-01-31 MED ORDER — PROPOFOL 10 MG/ML IV BOLUS
INTRAVENOUS | Status: AC
  Filled 2022-01-31: qty 20

## 2022-01-31 NOTE — ED Triage Notes (Signed)
POV, vomiting since 1pm, "feels like something is stuck", c/o upper abd pain. Pt alert and oriented x 4, ambulatory to room.

## 2022-01-31 NOTE — Consult Note (Signed)
UNASSIGNED CONSULT  Reason for Consult:Food Impaction Referring Physician: ER  David Carroll HPI: This is an 81 year old male with a PMH of GERD, distant history of PUD, history of opiate addition, HTN, nephrolithiasis, and BPH admitted for complaints of a food impaction.  Starting at 1 PM today, after eating steak in his [place] food he experienced an acute dysphagia.  The food bolus lodged in his esophagus and he was unable to tolerate any further PO or to manage his oral secretions.  This induced singultus and he then presented to the ER.  One month ago he had a brief episode and a food impaction.  Before this time he denied any issues with dysphagia.  Past Medical History:  Diagnosis Date   Arthritis    KNEES   BPH (benign prostatic hypertrophy)    Complication of anesthesia SLOW RECOVERING FROM ANES. DRUGS   HX OPIATE ADDICTION-- PT ON SUBOXONE   Depression    Glaucoma    History of gastric ulcer    History of gastroesophageal reflux (GERD)    History of kidney stones    Hydronephrosis, left    Hypertension    Nephrolithiasis    Opiate addiction (HCC)    Urgency of urination     Past Surgical History:  Procedure Laterality Date   CATARACT EXTRACTION W/ INTRAOCULAR LENS  IMPLANT, BILATERAL     CYSTOSCOPY WITH BIOPSY Left 11/06/2012   Procedure: CYSTOSCOPY WITH BIOPSY;  Surgeon: David Ache, MD;  Location: [facility];  Service: Urology;  Laterality: Left;   EXTRACORPOREAL SHOCK WAVE LITHOTRIPSY Left 03-06-2011   HOLMIUM LASER APPLICATION Left 11/06/2012   Procedure: HOLMIUM LASER APPLICATION;  Surgeon: David Ache, MD;  Location: [facility];  Service: Urology;  Laterality: Left;   INGUINAL HERNIA REPAIR Bilateral 1970   RE-DO LEFT 1985   LUMBAR DISC SURGERY  09-24-2008   L3 -- L4   LUMBAR LAMINECTOMY  05/15/2019   with partial facet joint removal   MEDIAL COMPARTMENT ARTHROPLASTY, LEFT KNEE  02-22-2010   REMOVAL BENIGN MASS LEFT LUNG   1977   VAGOTOMY  1986   ULCERS    Family History  Problem Relation Age of Onset   Depression Mother     Social History:  reports that he quit smoking about 15 years ago. His smoking use included cigarettes. He has a 80.00 pack-year smoking history. He has never used smokeless tobacco. He reports current alcohol use of about 7.0 standard drinks of alcohol per week. He reports that he does not use drugs.  Allergies: No Known Allergies  Medications: Scheduled: Continuous:  sodium chloride Stopped (01/31/22 2232)   Followed by   sodium chloride Stopped (01/31/22 2348)    Results for orders placed or performed during the hospital encounter of 01/31/22 (from the past 24 hour(s))  CBC     Status: None   Collection Time: 01/31/22 10:21 PM  Result Value Ref Range   WBC 7.3 4.0 - 10.5 K/uL   RBC 4.35 4.22 - 5.81 MIL/uL   Hemoglobin 14.4 13.0 - 17.0 g/dL   HCT 87.5 64.3 - 32.9 %   MCV 94.7 80.0 - 100.0 fL   MCH 33.1 26.0 - 34.0 pg   MCHC 35.0 30.0 - 36.0 g/dL   RDW 51.8 84.1 - 66.0 %   Platelets 156 150 - 400 K/uL   nRBC 0.0 0.0 - 0.2 %  Comprehensive metabolic panel     Status: Abnormal   Collection Time: 01/31/22  10:21 PM  Result Value Ref Range   Sodium 142 135 - 145 mmol/L   Potassium 3.6 3.5 - 5.1 mmol/L   Chloride 105 98 - 111 mmol/L   CO2 26 22 - 32 mmol/L   Glucose, Bld 137 (H) 70 - 99 mg/dL   BUN 17 8 - 23 mg/dL   Creatinine, Ser 4.69 0.61 - 1.24 mg/dL   Calcium 9.1 8.9 - 62.9 mg/dL   Total Protein 7.8 6.5 - 8.1 g/dL   Albumin 4.2 3.5 - 5.0 g/dL   AST 25 15 - 41 U/L   ALT 26 0 - 44 U/L   Alkaline Phosphatase 75 38 - 126 U/L   Total Bilirubin 0.9 0.3 - 1.2 mg/dL   GFR, Estimated >52 >84 mL/min   Anion gap 11 5 - 15  Lipase, blood     Status: None   Collection Time: 01/31/22 10:21 PM  Result Value Ref Range   Lipase 11 11 - 51 U/L     No results found.  ROS:  As stated above in the HPI otherwise negative.  Blood pressure (!) 164/106, pulse 86, temperature  98.2 F (36.8 C), temperature source Oral, resp. rate 18, height 5\' 8"  (1.727 m), weight 85.7 kg, SpO2 97 %.    PE: Gen: NAD, Alert and Oriented HEENT:  [name]/AT, EOMI Neck: Supple, no LAD Lungs: CTA Bilaterally CV: RRR without M/G/R ABD: Soft, NTND, +BS Ext: No C/C/E  Assessment/Plan: 1) Food impaction. 2) Nausea/vomiting. 3) Singultus.  Plan: 1) EGD to treat the food impaction.  David Carroll D 01/31/2022, 11:50 PM

## 2022-01-31 NOTE — ED Notes (Signed)
Pt ambulatory from facility with wife. Pt instructed to go straight to [facility] ED. Stable at time of departure.

## 2022-01-31 NOTE — ED Provider Notes (Signed)
[facility] EMERGENCY DEPT Provider Note   CSN: [id number] Arrival date & time: 01/31/22  2107     History  Chief Complaint  Patient presents with   Emesis    David Carroll is a 81 y.o. male.   Emesis  Patient has a history of BPH, kidney stones, hypertension, GERD who presents to the ED with complaints of food stuck in his throat.  Patient states he had [place] food this afternoon.  Ever since then he feels like food boluses stuck in his distal esophagus.  He has been vomiting.  Whenever he tries to drink liquids he vomits.  The symptoms have been ongoing since 1 PM.  He denies any diarrhea or constipation.  He does have some mild discomfort in his upper abdomen.    Home Medications Prior to Admission medications   Medication Sig Start Date End Date Taking? Authorizing Provider  ALPRAZolam Prudy Feeler) 1 MG tablet Take 1 mg by mouth 3 (three) times daily as needed. 09/17/19   [provider]  amLODipine (NORVASC) 10 MG tablet Take 10 mg by mouth daily.    [provider]  amphetamine-dextroamphetamine (ADDERALL) 15 MG tablet Take 30 mg by mouth daily. 30 mg ER    [provider]  carisoprodol (SOMA) 350 MG tablet Take 350 mg by mouth 3 (three) times daily as needed. 08/14/19   [provider]  chlorpheniramine (CHLOR-TRIMETON) 4 MG tablet Take 4 mg by mouth 2 (two) times daily as needed for allergies.    [provider]  Cholecalciferol (VITAMIN D) 2000 UNITS tablet Take 2,000 Units by mouth daily.    [provider]  co-enzyme Q-10 30 MG capsule Take 30 mg by mouth at bedtime.     [provider]  diazepam (VALIUM) 10 MG tablet Take 10 mg by mouth every 6 (six) hours as needed. 09/08/19   [provider]  doxazosin (CARDURA XL) 8 MG 24 hr tablet Take 8 mg by mouth at bedtime.     [provider]  furosemide (LASIX) 40 MG tablet Take 40 mg by mouth daily. 08/24/19   [provider]   ibuprofen (ADVIL,MOTRIN) 800 MG tablet Take 600 mg by mouth every 6 (six) hours as needed for moderate pain (pain).     [provider]  levETIRAcetam (KEPPRA) 500 MG tablet Take 1 tablet (500 mg total) by mouth 2 (two) times daily for 7 days. 09/28/19 10/05/19  Amin, Ankit Chirag, MD  montelukast (SINGULAIR) 10 MG tablet Take 10 mg by mouth daily. 09/24/19   [provider]  oxycodone (OXY-IR) 5 MG capsule Take 1 capsule (5 mg total) by mouth every 4 (four) hours as needed. 09/28/19   Amin, Loura Halt, MD  oxyCODONE-acetaminophen (PERCOCET) 5-325 MG tablet Take 2 tablets by mouth every 4 (four) hours as needed. 01/17/20   Mesner, Barbara Cower, MD  psyllium (METAMUCIL) 58.6 % powder Take 1 packet by mouth daily.    [provider]  sennosides-docusate sodium (SENOKOT-S) 8.6-50 MG tablet Take 1 tablet by mouth 2 (two) times daily. While taking pain meds to prevent constipation. Patient taking differently: Take 1 tablet by mouth daily as needed for constipation (constipation). While taking pain meds to prevent constipation. 11/06/12   Sebastian Ache, MD  sertraline (ZOLOFT) 100 MG tablet Take 100 mg by mouth daily.     [provider]      Allergies    Patient has no known allergies.    Review of Systems  Review of Systems  Gastrointestinal:  Positive for vomiting.    Physical Exam Updated Vital Signs BP (!) 172/97   Pulse 82   Temp 97.9 F (36.6 C)   Resp 20   Ht 1.727 m (5\' 8" )   Wt 85.7 kg   SpO2 94%   BMI 28.74 kg/m  Physical Exam Vitals and nursing note reviewed.  Constitutional:      Appearance: He is well-developed.  HENT:     Head: Normocephalic and atraumatic.     Right Ear: External ear normal.     Left Ear: External ear normal.  Eyes:     General: No scleral icterus.       Right eye: No discharge.        Left eye: No discharge.     Conjunctiva/sclera: Conjunctivae normal.  Neck:     Trachea: No tracheal deviation.  Cardiovascular:      Rate and Rhythm: Normal rate and regular rhythm.  Pulmonary:     Effort: Pulmonary effort is normal. No respiratory distress.     Breath sounds: Normal breath sounds. No stridor. No wheezing or rales.  Abdominal:     General: Bowel sounds are normal. There is no distension.     Palpations: Abdomen is soft.     Tenderness: There is no abdominal tenderness. There is no guarding or rebound.  Musculoskeletal:        General: No tenderness or deformity.     Cervical back: Neck supple.  Skin:    General: Skin is warm and dry.     Findings: No rash.  Neurological:     General: No focal deficit present.     Mental Status: He is alert.     Cranial Nerves: No cranial nerve deficit (no facial droop, extraocular movements intact, no slurred speech).     Sensory: No sensory deficit.     Motor: No abnormal muscle tone or seizure activity.     Coordination: Coordination normal.  Psychiatric:        Mood and Affect: Mood normal.     ED Results / Procedures / Treatments   Labs (all labs ordered are listed, but only abnormal results are displayed) Labs Reviewed  CBC  COMPREHENSIVE METABOLIC PANEL  LIPASE, BLOOD    EKG None  Radiology No results found.  Procedures Procedures    Medications Ordered in ED Medications  sodium chloride 0.9 % bolus 1,000 mL (has no administration in time range)    Followed by  0.9 %  sodium chloride infusion (has no administration in time range)  ondansetron (ZOFRAN) injection 4 mg (has no administration in time range)    ED Course/ Medical Decision Making/ A&P                           Medical Decision Making Amount and/or Complexity of Data Reviewed Labs: ordered.  Risk Prescription drug management.   Patient presents to the ED for evaluation of of nausea vomiting after eating food.  Patient states he feels like something is stuck in his esophagus.  He has not been able to talk or eat any liquids.  Patient's abdominal exam is  benign.  Presentation does not suggest bowel obstruction.  I am concerned about an esophageal food impaction.  He has had trouble in the past where he feels like food will get stuck and he has to make sure he chews food thoroughly.  I discussed the case  with Dr. Elnoria Howard.  He will see the patient at [facility] long ED for endoscopy.  The patient's wife will take him via private vehicle.  She is comfortable with doing this.  Patient's basic labs have been drawn.  They are currently pending.  I have notified the [facility] long EDPs about the patient's pending arrival.        Final Clinical Impression(s) / ED Diagnoses Final diagnoses:  Esophageal obstruction due to food impaction    Rx / DC Orders ED Discharge Orders     None         Linwood Dibbles, MD 01/31/22 2231

## 2022-02-01 ENCOUNTER — Emergency Department (HOSPITAL_COMMUNITY): Payer: Medicare Other | Admitting: Anesthesiology

## 2022-02-01 ENCOUNTER — Encounter (HOSPITAL_COMMUNITY): Payer: Self-pay

## 2022-02-01 ENCOUNTER — Encounter (HOSPITAL_COMMUNITY)
Admission: EM | Disposition: A | Discharge status: Home / Self Care | Payer: Self-pay | Source: Home / Self Care | Attending: Emergency Medicine

## 2022-02-01 ENCOUNTER — Emergency Department (EMERGENCY_DEPARTMENT_HOSPITAL): Payer: Medicare Other | Admitting: Anesthesiology

## 2022-02-01 DIAGNOSIS — Z87891 Personal history of nicotine dependence: Secondary | ICD-10-CM

## 2022-02-01 DIAGNOSIS — I10 Essential (primary) hypertension: Secondary | ICD-10-CM

## 2022-02-01 DIAGNOSIS — T18128A Food in esophagus causing other injury, initial encounter: Secondary | ICD-10-CM

## 2022-02-01 DIAGNOSIS — J449 Chronic obstructive pulmonary disease, unspecified: Secondary | ICD-10-CM

## 2022-02-01 DIAGNOSIS — K222 Esophageal obstruction: Secondary | ICD-10-CM

## 2022-02-01 HISTORY — PX: ESOPHAGOGASTRODUODENOSCOPY (EGD) WITH PROPOFOL: SHX5813

## 2022-02-01 HISTORY — PX: FOREIGN BODY REMOVAL: SHX962

## 2022-02-01 SURGERY — ESOPHAGOGASTRODUODENOSCOPY (EGD) WITH PROPOFOL
Anesthesia: General

## 2022-02-01 MED ORDER — ONDANSETRON HCL 4 MG/2ML IJ SOLN
INTRAMUSCULAR | Status: DC | PRN
  Administered 2022-02-01: 4 mg via INTRAVENOUS

## 2022-02-01 MED ORDER — PROPOFOL 10 MG/ML IV BOLUS
INTRAVENOUS | Status: DC | PRN
  Administered 2022-02-01: 150 mg via INTRAVENOUS
  Administered 2022-02-01: 50 mg via INTRAVENOUS

## 2022-02-01 MED ORDER — SODIUM CHLORIDE 0.9 % IV SOLN: INTRAVENOUS | Status: DC

## 2022-02-01 MED ORDER — DEXAMETHASONE SODIUM PHOSPHATE 10 MG/ML IJ SOLN
INTRAMUSCULAR | Status: DC | PRN
  Administered 2022-02-01: 8 mg via INTRAVENOUS

## 2022-02-01 MED ORDER — LACTATED RINGERS IV SOLN: INTRAVENOUS | Status: DC | PRN

## 2022-02-01 MED ORDER — LABETALOL HCL 5 MG/ML IV SOLN
INTRAVENOUS | Status: DC | PRN
  Administered 2022-02-01: 10 mg via INTRAVENOUS

## 2022-02-01 MED ORDER — LIDOCAINE 2% (20 MG/ML) 5 ML SYRINGE
INTRAMUSCULAR | Status: DC | PRN
  Administered 2022-02-01: 40 mg via INTRAVENOUS

## 2022-02-01 MED ORDER — FENTANYL CITRATE (PF) 100 MCG/2ML IJ SOLN: 25.0000 ug | INTRAMUSCULAR | Status: DC | PRN

## 2022-02-01 MED ORDER — SUCCINYLCHOLINE CHLORIDE 200 MG/10ML IV SOSY
PREFILLED_SYRINGE | INTRAVENOUS | Status: DC | PRN
  Administered 2022-02-01: 140 mg via INTRAVENOUS

## 2022-02-01 SURGICAL SUPPLY — 15 items

## 2022-02-01 NOTE — Anesthesia Preprocedure Evaluation (Addendum)
Anesthesia Evaluation  Patient identified by MRN, date of birth, ID band Patient awake    Reviewed: Allergy & Precautions, NPO status , Patient's Chart, lab work & pertinent test results  History of Anesthesia Complications Negative for: history of anesthetic complications  Airway Mallampati: II  TM Distance: >3 FB Neck ROM: Full    Dental  (+) Edentulous Upper, Edentulous Lower   Pulmonary sleep apnea (does not use his CPAP) , COPD,  COPD inhaler, former smoker,    breath sounds clear to auscultation       Cardiovascular hypertension, Pt. 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, Food impaction   Endo/Other  negative endocrine ROS  Renal/GU negative Renal ROS     Musculoskeletal  (+) narcotic dependent  Abdominal   Peds  Hematology negative hematology ROS (+)   Anesthesia Other Findings   Reproductive/Obstetrics                            Anesthesia Physical Anesthesia Plan  ASA: 3  Anesthesia Plan: General   Post-op Pain Management: Minimal or no pain anticipated   Induction: Intravenous and Rapid sequence  PONV Risk Score and Plan: 2 and Ondansetron and Dexamethasone  Airway Management Planned: Oral ETT  Additional Equipment: None  Intra-op Plan:   Post-operative Plan: Extubation in OR  Informed Consent: I have reviewed the patients History and Physical, chart, labs and discussed the procedure including the risks, benefits and alternatives for the proposed anesthesia with the patient or authorized representative who has indicated his/her understanding and acceptance.       Plan Discussed with: CRNA and Surgeon  Anesthesia Plan Comments:        Anesthesia Quick  Evaluation

## 2022-02-01 NOTE — Anesthesia Procedure Notes (Signed)
Procedure Name: Intubation Date/Time: 02/01/2022 12:21 AM  Performed by: Montel Clock, CRNAPre-anesthesia Checklist: Patient identified, Emergency Drugs available, Suction available, Patient being monitored and Timeout performed Patient Re-evaluated:Patient Re-evaluated prior to induction Oxygen Delivery Method: Circle system utilized Preoxygenation: Pre-oxygenation with 100% oxygen Induction Type: IV induction, Rapid sequence and Cricoid Pressure applied Laryngoscope Size: Mac and 3 Grade View: Grade I Tube type: Oral Tube size: 7.5 mm Number of attempts: 1 Airway Equipment and Method: Stylet Placement Confirmation: ETT inserted through vocal cords under direct vision, positive ETCO2 and breath sounds checked- equal and bilateral Secured at: 23 cm Tube secured with: Tape Dental Injury: Teeth and Oropharynx as per pre-operative assessment

## 2022-02-01 NOTE — ED Provider Notes (Signed)
  Physical Exam  BP (!) 153/81 (BP Location: Right Arm)   Pulse 70   Temp 98.1 F (36.7 C)   Resp 18   Ht 5\' 8"  (1.727 m)   Wt 85.7 kg   SpO2 95%   BMI 28.74 kg/m   Physical Exam Constitutional:      General: He is not in acute distress.    Appearance: Normal appearance.  HENT:     Head: Normocephalic and atraumatic.     Nose: No congestion or rhinorrhea.  Eyes:     General:        Right eye: No discharge.        Left eye: No discharge.     Extraocular Movements: Extraocular movements intact.     Pupils: Pupils are equal, round, and reactive to light.  Cardiovascular:     Rate and Rhythm: Normal rate and regular rhythm.     Heart sounds: No murmur heard. Pulmonary:     Effort: No respiratory distress.     Breath sounds: No wheezing or rales.  Abdominal:     General: There is no distension.     Tenderness: There is abdominal tenderness.  Musculoskeletal:        General: Normal range of motion.     Cervical back: Normal range of motion.  Skin:    General: Skin is warm and dry.  Neurological:     General: No focal deficit present.     Mental Status: He is alert.    Procedures  Procedures  ED Course / MDM    Medical Decision Making Amount and/or Complexity of Data Reviewed Labs: ordered.  Risk Prescription drug management.   Patient received in transfer.  Esophageal food bolus scheduled for endoscopy with Dr. .  Patient complaining of abdominal pain and received a single dose of fentanyl prior to going into the endoscopy suite.  Patient then sent to the endoscopy suite       Ival Basquez, Elnoria Howard, MD 02/01/22 0230

## 2022-02-01 NOTE — Transfer of Care (Signed)
Immediate Anesthesia Transfer of Care Note  Patient: David Carroll  Procedure(s) Performed: ESOPHAGOGASTRODUODENOSCOPY (EGD) WITH PROPOFOL FOREIGN BODY REMOVAL  Patient Location: PACU  Anesthesia Type:General  Level of Consciousness: drowsy and patient cooperative  Airway & Oxygen Therapy: Patient Spontanous Breathing and Patient connected to face mask oxygen  Post-op Assessment: Report given to RN and Post -op Vital signs reviewed and stable  Post vital signs: Reviewed and stable  Last Vitals:  Vitals Value Taken Time  BP 146/87 02/01/22 0045  Temp 36.7 C 02/01/22 0045  Pulse 69 02/01/22 0051  Resp 17 02/01/22 0051  SpO2 98 % 02/01/22 0051  Vitals shown include unvalidated device data.  Last Pain:  Vitals:   02/01/22 0005  TempSrc: Temporal  PainSc: 5          Complications: No notable events documented.

## 2022-02-01 NOTE — Op Note (Signed)
[facility] Patient Name: David Carroll Procedure Date: 02/01/2022 MRN: [id number] Attending MD: Jeani Hawking , MD Date of Birth: [birth year] CSN: [id number] Age: 81 Admit Type: Outpatient Procedure:                Upper GI endoscopy Indications:              Dysphagia Providers:                Jeani Hawking, MD, Margaree Mackintosh, RN,                            Kandice Robinsons, Technician Referring MD:              Medicines:                General Anesthesia Complications:            No immediate complications. Estimated Blood Loss:     Estimated blood loss: none. Procedure:                Pre-Anesthesia Assessment:                           - Prior to the procedure, a History and Physical                            was performed, and patient medications and                            allergies were reviewed. The patient's tolerance of                            previous anesthesia was also reviewed. The risks                            and benefits of the procedure and the sedation                            options and risks were discussed with the patient.                            All questions were answered, and informed consent                            was obtained. Prior Anticoagulants: The patient has                            taken no previous anticoagulant or antiplatelet                            agents. ASA Grade Assessment: II - A patient with                            mild systemic disease. After reviewing the risks                            and benefits, the  patient was deemed in                            satisfactory condition to undergo the procedure.                           - Sedation was administered by an anesthesia                            professional. General anesthesia was attained.                           After obtaining informed consent, the endoscope was                            passed under direct vision. Throughout the                             procedure, the patient's blood pressure, pulse, and                            oxygen saturations were monitored continuously. The                            [id number] Olympus endoscope was introduced                            through the mouth, and advanced to the second part                            of duodenum. The upper GI endoscopy was                            accomplished without difficulty. The patient                            tolerated the procedure well. Scope In: Scope Out: Findings:      Food was found in the lower third of the esophagus.      One benign-appearing, intrinsic mild stenosis was found in the lower       third of the esophagus. This stenosis measured 1.2 cm (inner diameter) x       1 cm (in length). The stenosis was traversed.      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 A gastric       pouch was found.      The examined duodenum was normal. Impression:               - Food in the lower third of the esophagus.                           - Benign-appearing esophageal stenosis.                           - Patent Billroth I gastroduodenostomy was found.                           -  Normal examined duodenum.                           - No specimens collected. Moderate Sedation:      Not Applicable - Patient had care per Anesthesia. Recommendation:           - Discharge patient to home (ambulatory).                           - Resume regular diet. CHEW food well.                           - Continue present medications.                           - PPI QD.                           - Return to GI clinic in 2 weeks. Procedure Code(s):        --- Professional ---                           [phone], Esophagogastroduodenoscopy, flexible,                            transoral; diagnostic, including collection of                            specimen(s) by brushing or washing, when performed                             (separate procedure) Diagnosis Code(s):        --- Professional ---                           Q96.438V, Food in esophagus causing other injury,                            initial encounter                           K22.2, Esophageal obstruction                           Z98.0, Intestinal bypass and anastomosis status                           R13.10, Dysphagia, unspecified CPT copyright 2019 [employer] Medical Association. All rights reserved. The codes documented in this report are preliminary and upon coder review may  be revised to meet current compliance requirements. Jeani Hawking, MD Jeani Hawking, MD 02/01/2022 12:36:40 AM This report has been signed electronically. Number of Addenda: 0

## 2022-02-01 NOTE — Anesthesia Postprocedure Evaluation (Signed)
Anesthesia Post Note  Patient: Emmanuelle Coxe  Procedure(s) Performed: ESOPHAGOGASTRODUODENOSCOPY (EGD) WITH PROPOFOL FOREIGN BODY REMOVAL     Patient location during evaluation: PACU Anesthesia Type: General Level of consciousness: awake and alert, patient cooperative and oriented Pain management: pain level controlled Vital Signs Assessment: post-procedure vital signs reviewed and stable Respiratory status: spontaneous breathing, nonlabored ventilation and respiratory function stable Cardiovascular status: blood pressure returned to baseline and stable Postop Assessment: no apparent nausea or vomiting and able to ambulate Anesthetic complications: no   No notable events documented.  Last Vitals:  Vitals:   02/01/22 0052 02/01/22 0100  BP:  (!) 155/85  Pulse: 69 67  Resp: 17 15  Temp:    SpO2: 98% 95%    Last Pain:  Vitals:   02/01/22 0045  TempSrc:   PainSc: 0-No pain                 Nickoles Gregori,E. Jaria Conway

## 2022-02-01 NOTE — Discharge Instructions (Signed)

## 2022-02-05 ENCOUNTER — Encounter (HOSPITAL_COMMUNITY): Payer: Self-pay | Admitting: Gastroenterology

## 2022-11-03 ENCOUNTER — Ambulatory Visit (INDEPENDENT_AMBULATORY_CARE_PROVIDER_SITE_OTHER): Payer: Medicare Other | Admitting: Family

## 2022-11-03 ENCOUNTER — Encounter: Payer: Self-pay | Admitting: Family

## 2022-11-03 VITALS — BP 116/68 | HR 92 | Temp 98.6°F | Resp 20 | Ht 69.0 in | Wt 194.0 lb

## 2022-11-03 DIAGNOSIS — J449 Chronic obstructive pulmonary disease, unspecified: Secondary | ICD-10-CM | POA: Diagnosis not present

## 2022-11-03 DIAGNOSIS — G4733 Obstructive sleep apnea (adult) (pediatric): Secondary | ICD-10-CM

## 2022-11-03 DIAGNOSIS — F5101 Primary insomnia: Secondary | ICD-10-CM

## 2022-11-03 DIAGNOSIS — F330 Major depressive disorder, recurrent, mild: Secondary | ICD-10-CM | POA: Diagnosis not present

## 2022-11-03 DIAGNOSIS — I10 Essential (primary) hypertension: Secondary | ICD-10-CM

## 2022-11-03 DIAGNOSIS — B379 Candidiasis, unspecified: Secondary | ICD-10-CM

## 2022-11-03 DIAGNOSIS — R1032 Left lower quadrant pain: Secondary | ICD-10-CM

## 2022-11-03 MED ORDER — NYSTATIN 100000 UNIT/GM EX CREA: 1.0000 | TOPICAL_CREAM | Freq: Two times a day (BID) | CUTANEOUS | 0 refills | Status: AC

## 2022-11-03 NOTE — Progress Notes (Signed)
Provider: Carilyn Goodpasture Eastyn Dattilo FNP-C   Corrington, Meredith Mody, MD  Patient Care Team: Corrington, Meredith Mody, MD as PCP - General (Family Medicine)  Extended Emergency Contact Information Primary Emergency Contact: Gulledge,Genivenee Address: [address]          [place], [place] Home Phone: [phone] Mobile Phone: [phone] Relation: Spouse  Code Status:  Full Code  Goals of care: Advanced Directive information    01/31/2022    9:17 PM  Advanced Directives  Does Patient Have a Medical Advance Directive? No  Would patient like information on creating a medical advance directive? No - Patient declined     Chief Complaint  Patient presents with  . New Patient (Initial Visit)    Patient presents today for a new patient appointment.    HPI:  Pt is a 82 y.o. male seen today establish care here at [facility] for medical management of chronic diseases.Has a medical history of Essential Hypertension,COPD,Major depression,  Depression - Not taking sertraline states does not feel depressed.has symptoms occasionally.does not like how the medication makes him feel. Has been taking adderrall for a long time due to low energy. Also takes Alprazolam during the day whenever he feels shaky during the day.    COPD- states no exacerbation   Used to smoke cigarettes 2 pack per day. Quit 17 yrs ago.  Constipation - on metamucil .tries to include veggies and fruits. Has been working with PT and exercise by working in the yard.No structure exercise. States pool exercises. Used to do chair Yoga but stopped since he did not feel good.   Hypertension - states B/p has been low but thinks machine need to change the batteries.He denies any headache,dizziness,vision changes,fatigue,chest tightness,palpitation,chest pain or shortness of breath.     He fell out of the bed couple days ago.He was unable to get up wife assisted him.   OSA - uses the C-Pap for 5 years on  for 2-3 hrs.wakes up around 2 am and unable to go back to sleeps.  Complains of irritation on the bottom that is itchy and bleeding.Has used cortisol cream.sat on the caution last night which helped him to sleep.      Past Medical History:  Diagnosis Date  . Arthritis    KNEES  . BPH (benign prostatic hypertrophy)   . Complication of anesthesia SLOW RECOVERING FROM ANES. DRUGS   HX OPIATE ADDICTION-- PT ON SUBOXONE  . Depression   . Glaucoma   . History of gastric ulcer   . History of gastroesophageal reflux (GERD)   . History of kidney stones   . Hydronephrosis, left   . Hypertension   . Nephrolithiasis   . Opiate addiction (HCC)   . Urgency of urination    Past Surgical History:  Procedure Laterality Date  . CATARACT EXTRACTION W/ INTRAOCULAR LENS  IMPLANT, BILATERAL    . CYSTOSCOPY WITH BIOPSY Left 11/06/2012   Procedure: CYSTOSCOPY WITH BIOPSY;  Surgeon: Sebastian Ache, MD;  Location: [facility];  Service: Urology;  Laterality: Left;  . ESOPHAGOGASTRODUODENOSCOPY (EGD) WITH PROPOFOL N/A 02/01/2022   Procedure: ESOPHAGOGASTRODUODENOSCOPY (EGD) WITH PROPOFOL;  Surgeon: Jeani Hawking, MD;  Location: [facility];  Service: Gastroenterology;  Laterality: N/A;  . EXTRACORPOREAL SHOCK WAVE LITHOTRIPSY Left 03-06-2011  . FOREIGN BODY REMOVAL  02/01/2022   Procedure: FOREIGN BODY REMOVAL;  Surgeon: Jeani Hawking, MD;  Location: [facility];  Service: Gastroenterology;;  . HOLMIUM LASER APPLICATION Left 11/06/2012  Procedure: HOLMIUM LASER APPLICATION;  Surgeon: Sebastian Ache, MD;  Location: [facility];  Service: Urology;  Laterality: Left;  . INGUINAL HERNIA REPAIR Bilateral 1970   RE-DO LEFT 1985  . LUMBAR DISC SURGERY  09-24-2008   L3 -- L4  . LUMBAR LAMINECTOMY  05/15/2019   with partial facet joint removal  . MEDIAL COMPARTMENT ARTHROPLASTY, LEFT KNEE  02-22-2010  . REMOVAL BENIGN MASS LEFT LUNG  1977  . VAGOTOMY  1986   ULCERS    No  Known Allergies  Allergies as of 11/03/2022   No Known Allergies      Medication List        Accurate as of Nov 03, 2022 11:00 AM. If you have any questions, ask your nurse or doctor.          albuterol 108 (90 Base) MCG/ACT inhaler Commonly known as: VENTOLIN HFA Inhale 1-2 puffs into the lungs every 4 (four) hours as needed.   ALPRAZolam 1 MG tablet Commonly known as: XANAX Take 1 mg by mouth 3 (three) times daily as needed.   amLODipine 10 MG tablet Commonly known as: NORVASC Take 10 mg by mouth daily.   amphetamine-dextroamphetamine 15 MG tablet Commonly known as: ADDERALL Take 30 mg by mouth daily. 30 mg ER   carisoprodol 350 MG tablet Commonly known as: SOMA Take 350 mg by mouth 3 (three) times daily as needed.   chlorpheniramine 4 MG tablet Commonly known as: CHLOR-TRIMETON Take 4 mg by mouth 2 (two) times daily as needed for allergies.   co-enzyme Q-10 30 MG capsule Take 30 mg by mouth at bedtime.   diazepam 10 MG tablet Commonly known as: VALIUM Take 10 mg by mouth every 6 (six) hours as needed.   doxazosin 8 MG 24 hr tablet Commonly known as: CARDURA XL Take 8 mg by mouth at bedtime.   furosemide 40 MG tablet Commonly known as: LASIX Take 40 mg by mouth daily.   ibuprofen 800 MG tablet Commonly known as: ADVIL Take 600 mg by mouth every 6 (six) hours as needed for moderate pain (pain).   levETIRAcetam 500 MG tablet Commonly known as: KEPPRA Take 1 tablet (500 mg total) by mouth 2 (two) times daily for 7 days.   montelukast 10 MG tablet Commonly known as: SINGULAIR Take 10 mg by mouth daily.   olmesartan 20 MG tablet Commonly known as: BENICAR Take 1 tablet by mouth daily.   oxycodone 5 MG capsule Commonly known as: OXY-IR Take 1 capsule (5 mg total) by mouth every 4 (four) hours as needed.   oxyCODONE-acetaminophen 5-325 MG tablet Commonly known as: Percocet Take 2 tablets by mouth every 4 (four) hours as needed.   psyllium 58.6  % powder Commonly known as: METAMUCIL Take 1 packet by mouth daily.   sennosides-docusate sodium 8.6-50 MG tablet Commonly known as: SENOKOT-S Take 1 tablet by mouth 2 (two) times daily. While taking pain meds to prevent constipation. What changed:  when to take this reasons to take this   sertraline 100 MG tablet Commonly known as: ZOLOFT Take 100 mg by mouth daily.   Spiriva Respimat 2.5 MCG/ACT Aers Generic drug: Tiotropium Bromide Monohydrate Use as directed 2 each in the mouth or throat daily.   Vitamin D 50 MCG (2000 UT) tablet Take 2,000 Units by mouth daily.        Review of Systems  Constitutional:  Negative for appetite change, chills, fatigue, fever and unexpected weight change.  HENT:  Negative  for congestion, dental problem, ear discharge, ear pain, facial swelling, hearing loss, nosebleeds, postnasal drip, rhinorrhea, sinus pressure, sinus pain, sneezing, sore throat, tinnitus and trouble swallowing.   Eyes:  Negative for pain, discharge, redness, itching and visual disturbance.  Respiratory:  Negative for cough, chest tightness, shortness of breath and wheezing.        OSA  Cardiovascular:  Negative for chest pain, palpitations and leg swelling.  Gastrointestinal:  Positive for abdominal pain. Negative for abdominal distention, blood in stool, constipation, diarrhea, nausea and vomiting.       LLQ abdominal pain   Endocrine: Negative for cold intolerance, heat intolerance, polydipsia, polyphagia and polyuria.  Genitourinary:  Negative for difficulty urinating, dysuria, flank pain, frequency and urgency.  Musculoskeletal:  Positive for arthralgias and gait problem. Negative for back pain, joint swelling, myalgias, neck pain and neck stiffness.  Skin:  Positive for rash. Negative for color change, pallor and wound.       Sacral /buttock   Neurological:  Negative for dizziness, syncope, speech difficulty, weakness, light-headedness, numbness and headaches.   Hematological:  Does not bruise/bleed easily.  Psychiatric/Behavioral:  Positive for sleep disturbance. Negative for agitation, behavioral problems, confusion, hallucinations, self-injury and suicidal ideas. The patient is not nervous/anxious.        Off depression medication.mild symptoms. ? Anxiety/shaky during the day takes Alprazolam     Immunization History  Administered Date(s) Administered  . Fluad Quad(high Dose 65+) 03/14/2022  . Moderna Sars-Covid-2 Vaccination 10/23/2019  . PFIZER(Purple Top)SARS-COV-2 Vaccination 09/02/2019, 04/08/2020  . [employer] 63yrs & up 06/30/2021  . Pneumococcal Conjugate-13 05/16/2016  . Pneumococcal Polysaccharide-23 11/06/1998, 11/17/2013, 04/16/2015  . Tdap 11/05/2013  . Zoster Recombinat (Shingrix) 10/15/2017, 12/15/2017   Pertinent  Health Maintenance Due  Topic Date Due  . INFLUENZA VACCINE  01/11/2023      09/27/2019    1:07 PM 09/27/2019    8:10 PM 09/28/2019    8:00 AM 01/16/2020   10:30 PM 01/31/2022    9:17 PM  Fall Risk  (RETIRED) Patient Fall Risk Level High fall risk High fall risk High fall risk High fall risk Low fall risk   Functional Status Survey:    Vitals:   11/03/22 1034  BP: 116/68  Pulse: 92  Resp: 20  Temp: 98.6 F (37 C)  SpO2: 94%  Weight: 194 lb (88 kg)  Height: 5\' 9"  (1.753 m)   Body mass index is 28.65 kg/m. Physical Exam Vitals reviewed.  Constitutional:      General: He is not in acute distress.    Appearance: Normal appearance. He is normal weight. He is not ill-appearing or diaphoretic.  HENT:     Head: Normocephalic.     Right Ear: Tympanic membrane, ear canal and external ear normal. There is no impacted cerumen.     Left Ear: Tympanic membrane, ear canal and external ear normal. There is no impacted cerumen.     Nose: Nose normal. No congestion or rhinorrhea.     Mouth/Throat:     Mouth: Mucous membranes are moist.     Pharynx: Oropharynx is clear. No  oropharyngeal exudate or posterior oropharyngeal erythema.  Eyes:     General: No scleral icterus.       Right eye: No discharge.        Left eye: No discharge.     Extraocular Movements: Extraocular movements intact.     Conjunctiva/sclera: Conjunctivae normal.     Pupils: Pupils are equal, round, and  reactive to light.  Neck:     Vascular: No carotid bruit.  Cardiovascular:     Rate and Rhythm: Normal rate and regular rhythm.     Pulses: Normal pulses.     Heart sounds: Normal heart sounds. No murmur heard.    No friction rub. No gallop.  Pulmonary:     Effort: Pulmonary effort is normal. No respiratory distress.     Breath sounds: Normal breath sounds. No wheezing, rhonchi or rales.  Chest:     Chest wall: No tenderness.  Abdominal:     General: Bowel sounds are normal. There is no distension.     Palpations: Abdomen is soft. There is no mass.     Tenderness: There is abdominal tenderness in the left lower quadrant. There is no right CVA tenderness, left CVA tenderness, guarding or rebound.  Musculoskeletal:        General: No swelling or tenderness. Normal range of motion.     Cervical back: Normal range of motion. No rigidity or tenderness.     Right lower leg: No edema.     Left lower leg: No edema.  Lymphadenopathy:     Cervical: No cervical adenopathy.  Skin:    General: Skin is warm and dry.     Coloration: Skin is not pale.     Findings: No bruising, erythema, lesion or rash.     Comments: Sacral /gluteal area beefy redness  with whitish linear laceration between gluteal fold.  Neurological:     Mental Status: He is alert and oriented to person, place, and time.     Cranial Nerves: No cranial nerve deficit.     Sensory: No sensory deficit.     Motor: No weakness.     Coordination: Coordination normal.     Gait: Gait abnormal.  Psychiatric:        Mood and Affect: Mood normal.        Speech: Speech normal.        Behavior: Behavior normal.        Thought  Content: Thought content normal.        Judgment: Judgment normal.    Labs reviewed: Recent Labs    01/31/22 2221  NA 142  K 3.6  CL 105  CO2 26  GLUCOSE 137*  BUN 17  CREATININE 0.93  CALCIUM 9.1   Recent Labs    01/31/22 2221  AST 25  ALT 26  ALKPHOS 75  BILITOT 0.9  PROT 7.8  ALBUMIN 4.2   Recent Labs    01/31/22 2221  WBC 7.3  HGB 14.4  HCT 41.2  MCV 94.7  PLT 156   No results found for: "TSH" No results found for: "HGBA1C" No results found for: "CHOL", "HDL", "LDLCALC", "LDLDIRECT", "TRIG", "CHOLHDL"  Significant Diagnostic Results in last 30 days:  No results found.  Assessment/Plan There are no diagnoses linked to this encounter.   Family/ staff Communication: Reviewed plan of care with patient  Labs/tests ordered: None   Next Appointment :   Caesar Bookman, NP

## 2022-11-10 ENCOUNTER — Other Ambulatory Visit: Payer: Medicare Other

## 2022-11-10 DIAGNOSIS — I10 Essential (primary) hypertension: Secondary | ICD-10-CM

## 2022-11-10 DIAGNOSIS — F330 Major depressive disorder, recurrent, mild: Secondary | ICD-10-CM

## 2022-11-15 LAB — CBC WITH DIFFERENTIAL/PLATELET
Absolute Monocytes: 371 cells/uL (ref 200–950)
Basophils Absolute: 19 cells/uL (ref 0–200)
Basophils Relative: 0.4 %
Eosinophils Absolute: 282 cells/uL (ref 15–500)
Eosinophils Relative: 6 %
HCT: 35.5 % — ABNORMAL LOW (ref 38.5–50.0)
Hemoglobin: 12 g/dL — ABNORMAL LOW (ref 13.2–17.1)
Lymphs Abs: 959 cells/uL (ref 850–3900)
MCH: 31.7 pg (ref 27.0–33.0)
MCHC: 33.8 g/dL (ref 32.0–36.0)
MCV: 93.9 fL (ref 80.0–100.0)
MPV: 10.5 fL (ref 7.5–12.5)
Monocytes Relative: 7.9 %
Neutro Abs: 3069 cells/uL (ref 1500–7800)
Neutrophils Relative %: 65.3 %
Platelets: 168 10*3/uL (ref 140–400)
RBC: 3.78 10*6/uL — ABNORMAL LOW (ref 4.20–5.80)
RDW: 11.8 % (ref 11.0–15.0)
Total Lymphocyte: 20.4 %
WBC: 4.7 10*3/uL (ref 3.8–10.8)

## 2022-11-15 LAB — COMPLETE METABOLIC PANEL WITH GFR
AG Ratio: 1.3 (calc) (ref 1.0–2.5)
ALT: 16 U/L (ref 9–46)
AST: 17 U/L (ref 10–35)
Albumin: 3.9 g/dL (ref 3.6–5.1)
Alkaline phosphatase (APISO): 84 U/L (ref 35–144)
BUN/Creatinine Ratio: 20 (calc) (ref 6–22)
BUN: 25 mg/dL (ref 7–25)
CO2: 30 mmol/L (ref 20–32)
Calcium: 9.5 mg/dL (ref 8.6–10.3)
Chloride: 107 mmol/L (ref 98–110)
Creat: 1.26 mg/dL — ABNORMAL HIGH (ref 0.70–1.22)
Globulin: 3 g/dL (calc) (ref 1.9–3.7)
Glucose, Bld: 100 mg/dL — ABNORMAL HIGH (ref 65–99)
Potassium: 4.2 mmol/L (ref 3.5–5.3)
Sodium: 144 mmol/L (ref 135–146)
Total Bilirubin: 0.7 mg/dL (ref 0.2–1.2)
Total Protein: 6.9 g/dL (ref 6.1–8.1)
eGFR: 57 mL/min/{1.73_m2} — ABNORMAL LOW (ref >=60)

## 2022-11-15 LAB — LIPID PANEL
Cholesterol: 152 mg/dL (ref <200)
HDL: 79 mg/dL (ref >=40)
LDL Cholesterol (Calc): 59 mg/dL (calc)
Non-HDL Cholesterol (Calc): 73 mg/dL (calc) (ref <130)
Total CHOL/HDL Ratio: 1.9 (calc) (ref <5.0)
Triglycerides: 55 mg/dL (ref <150)

## 2022-11-15 LAB — TEST AUTHORIZATION

## 2022-11-15 LAB — TSH: TSH: 1.86 mIU/L (ref 0.40–4.50)

## 2022-11-15 LAB — HEMOGLOBIN A1C W/OUT EAG: Hgb A1c MFr Bld: 5.5 % of total Hgb (ref <5.7)

## 2022-11-24 ENCOUNTER — Telehealth: Payer: Medicare Other | Admitting: Family

## 2022-11-24 NOTE — Telephone Encounter (Signed)
Psychiatry referral was ordered on 11/03/2022 according to referral status states awaiting review by psychiatry service then will call you.

## 2022-11-24 NOTE — Telephone Encounter (Signed)
Patient called in and stated that he was to get a referral for a Psychiatrist but have not heard anything. Asking if he can please get a referral sent in to one soon as possible. Please advise

## 2022-12-07 ENCOUNTER — Ambulatory Visit
Admission: RE | Admit: 2022-12-07 | Discharge: 2022-12-07 | Disposition: A | Discharge status: Home / Self Care | Payer: Medicare Other | Source: Ambulatory Visit | Attending: Family | Admitting: Family

## 2022-12-07 DIAGNOSIS — R1032 Left lower quadrant pain: Secondary | ICD-10-CM

## 2022-12-19 ENCOUNTER — Emergency Department (HOSPITAL_COMMUNITY)
Admission: EM | Admit: 2022-12-19 | Discharge: 2022-12-19 | Disposition: A | Discharge status: Home / Self Care | Payer: Medicare Other | Attending: Emergency Medicine | Admitting: Emergency Medicine

## 2022-12-19 ENCOUNTER — Other Ambulatory Visit: Payer: Self-pay

## 2022-12-19 ENCOUNTER — Encounter (HOSPITAL_COMMUNITY): Payer: Self-pay | Admitting: *Deleted

## 2022-12-19 ENCOUNTER — Ambulatory Visit: Payer: Medicare Other | Admitting: Family

## 2022-12-19 DIAGNOSIS — I10 Essential (primary) hypertension: Secondary | ICD-10-CM | POA: Diagnosis not present

## 2022-12-19 DIAGNOSIS — E860 Dehydration: Secondary | ICD-10-CM | POA: Diagnosis present

## 2022-12-19 DIAGNOSIS — Z79899 Other long term (current) drug therapy: Secondary | ICD-10-CM | POA: Diagnosis not present

## 2022-12-19 LAB — COMPREHENSIVE METABOLIC PANEL
ALT: 17 U/L (ref 0–44)
AST: 15 U/L (ref 15–41)
Albumin: 3.5 g/dL (ref 3.5–5.0)
Alkaline Phosphatase: 78 U/L (ref 38–126)
Anion gap: 8 (ref 5–15)
BUN: 32 mg/dL — ABNORMAL HIGH (ref 8–23)
CO2: 23 mmol/L (ref 22–32)
Calcium: 8.8 mg/dL — ABNORMAL LOW (ref 8.9–10.3)
Chloride: 106 mmol/L (ref 98–111)
Creatinine, Ser: 1.56 mg/dL — ABNORMAL HIGH (ref 0.61–1.24)
GFR, Estimated: 44 mL/min — ABNORMAL LOW (ref >60)
Glucose, Bld: 98 mg/dL (ref 70–99)
Potassium: 4.3 mmol/L (ref 3.5–5.1)
Sodium: 137 mmol/L (ref 135–145)
Total Bilirubin: 0.8 mg/dL (ref 0.3–1.2)
Total Protein: 6.9 g/dL (ref 6.5–8.1)

## 2022-12-19 LAB — CBC WITH DIFFERENTIAL/PLATELET
Abs Immature Granulocytes: 0.03 10*3/uL (ref 0.00–0.07)
Basophils Absolute: 0 10*3/uL (ref 0.0–0.1)
Basophils Relative: 0 %
Eosinophils Absolute: 0.1 10*3/uL (ref 0.0–0.5)
Eosinophils Relative: 1 %
HCT: 33.3 % — ABNORMAL LOW (ref 39.0–52.0)
Hemoglobin: 10.7 g/dL — ABNORMAL LOW (ref 13.0–17.0)
Immature Granulocytes: 1 %
Lymphocytes Relative: 11 %
Lymphs Abs: 0.7 10*3/uL (ref 0.7–4.0)
MCH: 31.8 pg (ref 26.0–34.0)
MCHC: 32.1 g/dL (ref 30.0–36.0)
MCV: 98.8 fL (ref 80.0–100.0)
Monocytes Absolute: 0.5 10*3/uL (ref 0.1–1.0)
Monocytes Relative: 7 %
Neutro Abs: 5.2 10*3/uL (ref 1.7–7.7)
Neutrophils Relative %: 80 %
Platelets: 177 10*3/uL (ref 150–400)
RBC: 3.37 MIL/uL — ABNORMAL LOW (ref 4.22–5.81)
RDW: 12.8 % (ref 11.5–15.5)
WBC: 6.5 10*3/uL (ref 4.0–10.5)
nRBC: 0 % (ref 0.0–0.2)

## 2022-12-19 LAB — URINALYSIS, ROUTINE W REFLEX MICROSCOPIC
Bilirubin Urine: NEGATIVE
Glucose, UA: NEGATIVE mg/dL
Hgb urine dipstick: NEGATIVE
Ketones, ur: 5 mg/dL — AB
Leukocytes,Ua: NEGATIVE
Nitrite: NEGATIVE
Protein, ur: NEGATIVE mg/dL
Specific Gravity, Urine: 1.012 (ref 1.005–1.030)
pH: 5 (ref 5.0–8.0)

## 2022-12-19 LAB — I-STAT CHEM 8, ED
BUN: 28 mg/dL — ABNORMAL HIGH (ref 8–23)
Calcium, Ion: 1.18 mmol/L (ref 1.15–1.40)
Chloride: 109 mmol/L (ref 98–111)
Creatinine, Ser: 1.5 mg/dL — ABNORMAL HIGH (ref 0.61–1.24)
Glucose, Bld: 97 mg/dL (ref 70–99)
HCT: 34 % — ABNORMAL LOW (ref 39.0–52.0)
Hemoglobin: 11.6 g/dL — ABNORMAL LOW (ref 13.0–17.0)
Potassium: 4.3 mmol/L (ref 3.5–5.1)
Sodium: 141 mmol/L (ref 135–145)
TCO2: 24 mmol/L (ref 22–32)

## 2022-12-19 LAB — CK: Total CK: 83 U/L (ref 49–397)

## 2022-12-19 MED ORDER — LACTATED RINGERS IV BOLUS
1000.0000 mL | Freq: Once | INTRAVENOUS | Status: AC
  Administered 2022-12-19: 1000 mL via INTRAVENOUS

## 2022-12-19 NOTE — Discharge Instructions (Addendum)
Use care when you are outdoors in this heat - make sure you are staying hydrated by bringing water with you - ideally,  avoid extended periods outdoors in the hottest parts of the day.  Also, make sure you are carrying your phone with you at all times in case you have a problem like today.   Follow up with your PCP in the next week to recheck your kidney function.

## 2022-12-19 NOTE — ED Provider Notes (Signed)
  Physical Exam  BP 138/83   Pulse 83   Temp (!) 96 F (35.6 C) (Rectal)   Resp 18   SpO2 97%   Physical Exam  Procedures  Procedures  ED Course / MDM    Medical Decision Making Amount and/or Complexity of Data Reviewed Labs: ordered.   This patient's care was assumed by me at shift change. The tests pending are UA. Plan is for discharge after results. Pt got lost today while out on a walk and was outside for 2 hours, his wife called 911, and when they found him he was hypotensive and gave 1 L NS.  Will go blood pressures when he got here is going to have more liters of fluid blood pressure is normal he is able to tolerate p.o.  Anticipating UA and will discharge him pending this and an i-STAT.  Patient was re-evaluated by me as well. I discussed their result with them and plan is for discharge home.  Is feeling much better and wants to go to the [facility].  I-STAT shows slightly improving renal function, urine is negative.       Josem Kaufmann 12/19/22 2305    Eber Hong, MD 12/20/22 1227

## 2022-12-19 NOTE — ED Provider Notes (Signed)
[facility] EMERGENCY DEPARTMENT AT [facility] Provider Note   CSN: [id number] Arrival date & time: 12/19/22  1428     History  Chief Complaint  Patient presents with   Heat Exposure    David Carroll is a 82 y.o. male with a history including hypertension, GERD, history of alcohol abuse and prior opiate addiction presenting for evaluation of heat exposure.  He went for a walk near his home and when he came to an area that he did not recognize he called his wife and let her know that he thought he was lost.  She reached out to fire and rescue who were able to find him in the woods behind their home.  He was out in the heat for approximately 2 hours.  He did not have water with him, although he states he frequently walks and usually does take water with him.  When EMS first evaluated patient he had blood pressures first 90/57, then it was 70/52, he was given normal saline 1 L during transport and is blood pressure improved to 93/58 upon arrival.  He reports generalized fatigue but no other complaints at this time.   The history is provided by the patient.       Home Medications Prior to Admission medications   Medication Sig Start Date End Date Taking? Authorizing Provider  albuterol (VENTOLIN HFA) 108 (90 Base) MCG/ACT inhaler Inhale 1-2 puffs into the lungs every 4 (four) hours as needed. 01/19/21   [provider]  ALPRAZolam Prudy Feeler) 1 MG tablet Take 1 mg by mouth 3 (three) times daily as needed. 09/17/19   [provider]  amphetamine-dextroamphetamine (ADDERALL) 15 MG tablet Take 30 mg by mouth daily. 30 mg ER    [provider]  chlorpheniramine (CHLOR-TRIMETON) 4 MG tablet Take 4 mg by mouth 2 (two) times daily as needed for allergies.    [provider]  Cholecalciferol (VITAMIN D) 2000 UNITS tablet Take 2,000 Units by mouth daily.    [provider]  co-enzyme Q-10 30 MG capsule Take 30 mg by mouth at bedtime.     [provider]  diazepam (VALIUM) 10 MG tablet Take 10 mg by mouth every 6 (six) hours as needed. 09/08/19   [provider]  doxazosin (CARDURA XL) 8 MG 24 hr tablet Take 8 mg by mouth at bedtime.     [provider]  levETIRAcetam (KEPPRA) 500 MG tablet Take 1 tablet (500 mg total) by mouth 2 (two) times daily for 7 days. 09/28/19 10/05/19  Amin, Loura Halt, MD  nystatin cream (MYCOSTATIN) Apply 1 Application topically 2 (two) times daily. Apply to affected area on sacral and buttock 11/03/22   Ngetich, Dinah C, NP  olmesartan (BENICAR) 20 MG tablet Take 1 tablet by mouth daily. 11/01/22   [provider]  psyllium (METAMUCIL) 58.6 % powder Take 1 packet by mouth daily.    [provider]  SPIRIVA RESPIMAT 2.5 MCG/ACT AERS Use as directed 2 each in the mouth or throat daily.    [provider]      Allergies    Patient has no known allergies.    Review of Systems   Review of Systems  Constitutional:  Negative for chills and fever.  HENT:  Negative for congestion and sore throat.   Eyes: Negative.   Respiratory:  Negative for chest tightness and shortness of breath.   Cardiovascular:  Negative for chest pain.  Gastrointestinal:  Negative for abdominal  pain, nausea and vomiting.  Genitourinary: Negative.   Musculoskeletal:  Negative for arthralgias, joint swelling and neck pain.  Skin: Negative.  Negative for pallor, rash and wound.  Neurological:  Negative for dizziness, weakness, light-headedness, numbness and headaches.  Psychiatric/Behavioral: Negative.      Physical Exam Updated Vital Signs BP 138/83   Pulse 83   Temp (!) 96 F (35.6 C) (Rectal)   Resp 18   SpO2 97%  Physical Exam Vitals and nursing note reviewed.  Constitutional:      Appearance: He is well-developed.  HENT:     Head: Normocephalic and atraumatic.     Mouth/Throat:     Mouth: Mucous membranes are dry.  Eyes:     Conjunctiva/sclera: Conjunctivae normal.   Cardiovascular:     Rate and Rhythm: Normal rate and regular rhythm.     Heart sounds: Normal heart sounds.  Pulmonary:     Effort: Pulmonary effort is normal.     Breath sounds: Normal breath sounds. No wheezing.  Abdominal:     General: Bowel sounds are normal.     Palpations: Abdomen is soft.     Tenderness: There is no abdominal tenderness.  Musculoskeletal:        General: Normal range of motion.     Cervical back: Normal range of motion.  Skin:    General: Skin is warm and dry.  Neurological:     General: No focal deficit present.     Mental Status: He is alert and oriented to person, place, and time.     ED Results / Procedures / Treatments   Labs (all labs ordered are listed, but only abnormal results are displayed) Labs Reviewed  CBC WITH DIFFERENTIAL/PLATELET - Abnormal; Notable for the following components:      Result Value   RBC 3.37 (*)    Hemoglobin 10.7 (*)    HCT 33.3 (*)    All other components within normal limits  COMPREHENSIVE METABOLIC PANEL - Abnormal; Notable for the following components:   BUN 32 (*)    Creatinine, Ser 1.56 (*)    Calcium 8.8 (*)    GFR, Estimated 44 (*)    All other components within normal limits  URINALYSIS, ROUTINE W REFLEX MICROSCOPIC - Abnormal; Notable for the following components:   Color, Urine AMBER (*)    APPearance CLOUDY (*)    Ketones, ur 5 (*)    All other components within normal limits  CK  I-STAT CHEM 8, ED    EKG EKG Interpretation Date/Time:  Tuesday December 19 2022 15:04:49 EDT Ventricular Rate:  87 PR Interval:  222 QRS Duration:  117 QT Interval:  381 QTC Calculation: 459 R Axis:   -71  Text Interpretation: Sinus rhythm Prolonged PR interval Left anterior fascicular block Confirmed by Eber Hong ([id number]) on 12/19/2022 3:16:11 PM  Radiology No results found.  Procedures Procedures    Medications Ordered in ED Medications  lactated ringers bolus 1,000 mL (0 mLs Intravenous Stopped 12/19/22  1843)    ED Course/ Medical Decision Making/ A&P                             Medical Decision Making Patient with heat related illness/dehydration after he went on a walk and became lost in the woods across from his home.  He states he usually carries water with him, today he did not have water, fortunately he did have his phone and was  able to call his wife for assistance.  Initially he was hypotensive, he was given IV fluids, feeling much better after rehydration.  He also tolerated p.o. fluids while here.  Pending repeat creatinine to ensure this is improving, urinalysis is also pending as he was just able to provide [place] a urine sample.  Discussed with Shawna Clamp, PA-C who assumes patient care and will dispo once labs are resulted and improved.  Amount and/or Complexity of Data Reviewed Labs: ordered.    Details: Creatinine 1.56 with a BUN of 32, his baseline creatinine 1 month ago was 1.26.           Final Clinical Impression(s) / ED Diagnoses Final diagnoses:  Dehydration    Rx / DC Orders ED Discharge Orders     None         Victoriano Lain 12/19/22 Daryl Eastern, MD 12/20/22 [phone]

## 2022-12-19 NOTE — ED Triage Notes (Signed)
Pt BIB [facility] for c/o getting lost in woods and having heat exhausion with hypotension  Pt  reports he went for a walk and when he came to an area that he did not recognize he called his wife and told her he was lost  Fire and rescue called and found pt  Pt was in woods for total of 2 hours  BP with ems was 90/57 and 70/52; pt was given a liter of NS by ems with some increase in pressure to 93/58  Pt is alert and oriented and diaphoretic

## 2022-12-26 ENCOUNTER — Encounter: Payer: Medicare Other | Admitting: Family

## 2022-12-26 ENCOUNTER — Ambulatory Visit: Payer: Medicare Other | Admitting: Adult Health

## 2022-12-26 ENCOUNTER — Encounter: Payer: Self-pay | Admitting: Adult Health

## 2022-12-26 VITALS — BP 118/80 | HR 82 | Temp 97.0°F | Resp 16 | Ht 69.0 in | Wt 192.8 lb

## 2022-12-26 DIAGNOSIS — F339 Major depressive disorder, recurrent, unspecified: Secondary | ICD-10-CM

## 2022-12-26 DIAGNOSIS — D649 Anemia, unspecified: Secondary | ICD-10-CM

## 2022-12-26 NOTE — Progress Notes (Signed)
Location:  [facility]   Place of Service:   clinic    CODE STATUS: full   No Known Allergies  Chief Complaint  Patient presents with   Acute Visit    Follow up results     HPI:  He had left upper hip and lower back pain. The has since resolved. The Ct scan did not demonstrate any significant findings. His hgb has dropped to 10.6.  he denies any blood in stools. He would like to have a psychologist consult. He had seen one for 5 years in the past. He does drink up to 2 alcohol drinks in one day. There are no nausea or vomiting or changes in stool.   Past Medical History:  Diagnosis Date   Arthritis    KNEES   BPH (benign prostatic hypertrophy)    Complication of anesthesia SLOW RECOVERING FROM ANES. DRUGS   HX OPIATE ADDICTION-- PT ON SUBOXONE   Depression    Glaucoma    History of gastric ulcer    History of gastroesophageal reflux (GERD)    History of kidney stones    Hydronephrosis, left    Hypertension    Nephrolithiasis    Opiate addiction (HCC)    Urgency of urination     Past Surgical History:  Procedure Laterality Date   CATARACT EXTRACTION W/ INTRAOCULAR LENS  IMPLANT, BILATERAL     CYSTOSCOPY WITH BIOPSY Left 11/06/2012   Procedure: CYSTOSCOPY WITH BIOPSY;  Surgeon: Sebastian Ache, MD;  Location: [facility];  Service: Urology;  Laterality: Left;   ESOPHAGOGASTRODUODENOSCOPY (EGD) WITH PROPOFOL N/A 02/01/2022   Procedure: ESOPHAGOGASTRODUODENOSCOPY (EGD) WITH PROPOFOL;  Surgeon: Jeani Hawking, MD;  Location: [facility];  Service: Gastroenterology;  Laterality: N/A;   EXTRACORPOREAL SHOCK WAVE LITHOTRIPSY Left 03-06-2011   FOREIGN BODY REMOVAL  02/01/2022   Procedure: FOREIGN BODY REMOVAL;  Surgeon: Jeani Hawking, MD;  Location: [facility];  Service: Gastroenterology;;   HOLMIUM LASER APPLICATION Left 11/06/2012   Procedure: HOLMIUM LASER APPLICATION;  Surgeon: Sebastian Ache, MD;  Location: [facility];  Service:  Urology;  Laterality: Left;   INGUINAL HERNIA REPAIR Bilateral 1970   RE-DO LEFT 1985   LUMBAR DISC SURGERY  09-24-2008   L3 -- L4   LUMBAR LAMINECTOMY  05/15/2019   with partial facet joint removal   MEDIAL COMPARTMENT ARTHROPLASTY, LEFT KNEE  02-22-2010   REMOVAL BENIGN MASS LEFT LUNG  1977   VAGOTOMY  1986   ULCERS    Social History   Socioeconomic History   Marital status: Married    Spouse name: Not on file   Number of children: Not on file   Years of education: Not on file   Highest education level: Not on file  Occupational History   Not on file  Tobacco Use   Smoking status: Former    Current packs/day: 0.00    Average packs/day: 2.0 packs/day for 40.0 years (80.0 ttl pk-yrs)    Types: Cigarettes    Start date: 10/31/1966    Quit date: 10/31/2006    Years since quitting: 16.1   Smokeless tobacco: Never  Vaping Use   Vaping status: Never Used  Substance and Sexual Activity   Alcohol use: Yes    Alcohol/week: 7.0 standard drinks of alcohol    Types: 7 Cans of beer per week    Comment: 2 shots of tequila a night   Drug use: No    Comment: hx opiate addiction in past, stopped Suboxone  4 days ago   Sexual activity: Not on file  Other Topics Concern   Not on file  Social History Narrative   Not on file   Social Determinants of Health   Financial Resource Strain: Low Risk  (07/19/2022)   Received from [facility], [facility]   Overall Financial Resource Strain (CARDIA)    Difficulty of Paying Living Expenses: Not hard at all  Food Insecurity: No Food Insecurity (07/19/2022)   Received from [facility], [facility]   Hunger Vital Sign    Worried About Running Out of Food in the Last Year: Never true    Ran Out of Food in the Last Year: Never true  Transportation Needs: No Transportation Needs (07/19/2022)   Received from [facility], [facility]   [facility] - Transportation    Lack of Transportation (Medical): No    Lack of Transportation  (Non-Medical): No  Physical Activity: Insufficiently Active (09/21/2021)   Received from [facility], [facility]   Exercise Vital Sign    Days of Exercise per Week: 2 days    Minutes of Exercise per Session: 60 min  Stress: No Stress Concern Present (09/21/2021)   Received from [facility], [facility] - Occupational Stress Questionnaire    Feeling of Stress : Only a little  Social Connections: Unknown (09/22/2022)   Received from [facility], [facility]   Social Network    Social Network: Not on file  Intimate Partner Violence: Unknown (09/22/2022)   Received from [facility], [facility]   HITS    Physically Hurt: Not on file    Insult or Talk Down To: Not on file    Threaten Physical Harm: Not on file    Scream or Curse: Not on file   Family History  Problem Relation Age of Onset   Depression Mother    COPD Sister    Blindness Brother       VITAL SIGNS BP 118/80   Pulse 82   Temp (!) 97 F (36.1 C)   Resp 16   Ht 5\' 9"  (1.753 m)   Wt 192 lb 12.8 oz (87.5 kg)   SpO2 96%   BMI 28.47 kg/m   Outpatient Encounter Medications as of 12/26/2022  Medication Sig   albuterol (VENTOLIN HFA) 108 (90 Base) MCG/ACT inhaler Inhale 1-2 puffs into the lungs every 4 (four) hours as needed.   ALPRAZolam (XANAX) 1 MG tablet Take 1 mg by mouth 3 (three) times daily as needed.   amphetamine-dextroamphetamine (ADDERALL) 15 MG tablet Take 30 mg by mouth daily. 30 mg ER   chlorpheniramine (CHLOR-TRIMETON) 4 MG tablet Take 4 mg by mouth 2 (two) times daily as needed for allergies.   Cholecalciferol (VITAMIN D) 2000 UNITS tablet Take 2,000 Units by mouth daily.   co-enzyme Q-10 30 MG capsule Take 30 mg by mouth at bedtime.    diazepam (VALIUM) 10 MG tablet Take 10 mg by mouth every 6 (six) hours as needed.   doxazosin (CARDURA XL) 8 MG 24 hr tablet Take 8 mg by mouth at bedtime.    nystatin cream (MYCOSTATIN) Apply 1 Application  topically 2 (two) times daily. Apply to affected area on sacral and buttock   olmesartan (BENICAR) 20 MG tablet Take 1 tablet by mouth daily.   pregabalin (LYRICA) 25 MG capsule Take 25 mg by mouth at bedtime.   psyllium (METAMUCIL) 58.6 % powder Take 1 packet by mouth daily.  SPIRIVA RESPIMAT 2.5 MCG/ACT AERS Use as directed 2 each in the mouth or throat daily.   [DISCONTINUED] levETIRAcetam (KEPPRA) 500 MG tablet Take 1 tablet (500 mg total) by mouth 2 (two) times daily for 7 days.   No facility-administered encounter medications on file as of 12/26/2022.     SIGNIFICANT DIAGNOSTIC EXAMS  Review of Systems  Constitutional:  Negative for malaise/fatigue.  Respiratory:  Negative for cough and shortness of breath.   Cardiovascular:  Negative for chest pain, palpitations and leg swelling.  Gastrointestinal:  Negative for abdominal pain, constipation and heartburn.  Musculoskeletal:  Negative for back pain, joint pain and myalgias.  Skin: Negative.   Neurological:  Negative for dizziness.  Psychiatric/Behavioral:  The patient is not nervous/anxious.      Physical Exam Constitutional:      General: He is not in acute distress.    Appearance: He is well-developed. He is not diaphoretic.  Neck:     Thyroid: No thyromegaly.  Pulmonary:     Effort: No respiratory distress.  Abdominal:     General: Bowel sounds are normal. There is no distension.     Palpations: Abdomen is soft.     Tenderness: There is no abdominal tenderness.  Musculoskeletal:        General: Normal range of motion.     Cervical back: Neck supple.     Right lower leg: No edema.     Left lower leg: No edema.  Lymphadenopathy:     Cervical: No cervical adenopathy.  Skin:    General: Skin is warm and dry.  Neurological:     Mental Status: He is alert and oriented to person, place, and time.  Psychiatric:        Mood and Affect: Mood normal.       ASSESSMENT/ PLAN:  TODAY  Anemia MDD  Will refer to  psychology; he would like to restart therapy He has a hemoccult card given; will return after sample obtained.    Synthia Innocent NP [facility]  call [phone]

## 2022-12-26 NOTE — Patient Instructions (Signed)
Take the stools cards; once samples have been obtained return to the office.  If the test demonstrates hidden blood will have to be seen by GI

## 2022-12-26 NOTE — Progress Notes (Signed)
  This encounter was created in error - please disregard. No show 

## 2023-05-07 ENCOUNTER — Ambulatory Visit: Payer: Medicare Other | Admitting: Family
